# Patient Record
Sex: Female | Born: 1935 | Race: White | Hispanic: No | Marital: Married | Smoking: Former smoker
Health system: Southern US, Community
[De-identification: ages and names within clinical notes are randomized; demographics above are authoritative.]

## PROBLEM LIST (undated history)

## (undated) DIAGNOSIS — Z85828 Personal history of other malignant neoplasm of skin: Secondary | ICD-10-CM

## (undated) DIAGNOSIS — G629 Polyneuropathy, unspecified: Secondary | ICD-10-CM

## (undated) DIAGNOSIS — R0600 Dyspnea, unspecified: Secondary | ICD-10-CM

## (undated) DIAGNOSIS — R5383 Other fatigue: Secondary | ICD-10-CM

## (undated) DIAGNOSIS — D352 Benign neoplasm of pituitary gland: Secondary | ICD-10-CM

## (undated) DIAGNOSIS — K649 Unspecified hemorrhoids: Secondary | ICD-10-CM

## (undated) DIAGNOSIS — C679 Malignant neoplasm of bladder, unspecified: Secondary | ICD-10-CM

## (undated) DIAGNOSIS — J449 Chronic obstructive pulmonary disease, unspecified: Secondary | ICD-10-CM

## (undated) DIAGNOSIS — K317 Polyp of stomach and duodenum: Secondary | ICD-10-CM

## (undated) DIAGNOSIS — E559 Vitamin D deficiency, unspecified: Secondary | ICD-10-CM

## (undated) DIAGNOSIS — I219 Acute myocardial infarction, unspecified: Secondary | ICD-10-CM

## (undated) DIAGNOSIS — Z974 Presence of external hearing-aid: Secondary | ICD-10-CM

## (undated) DIAGNOSIS — K5900 Constipation, unspecified: Secondary | ICD-10-CM

## (undated) DIAGNOSIS — M1990 Unspecified osteoarthritis, unspecified site: Secondary | ICD-10-CM

## (undated) DIAGNOSIS — J189 Pneumonia, unspecified organism: Secondary | ICD-10-CM

## (undated) DIAGNOSIS — R011 Cardiac murmur, unspecified: Secondary | ICD-10-CM

## (undated) DIAGNOSIS — I422 Other hypertrophic cardiomyopathy: Secondary | ICD-10-CM

## (undated) DIAGNOSIS — I5181 Takotsubo syndrome: Secondary | ICD-10-CM

## (undated) DIAGNOSIS — Z8619 Personal history of other infectious and parasitic diseases: Secondary | ICD-10-CM

## (undated) DIAGNOSIS — I779 Disorder of arteries and arterioles, unspecified: Secondary | ICD-10-CM

## (undated) DIAGNOSIS — K2960 Other gastritis without bleeding: Secondary | ICD-10-CM

## (undated) DIAGNOSIS — R7303 Prediabetes: Secondary | ICD-10-CM

## (undated) DIAGNOSIS — R0609 Other forms of dyspnea: Secondary | ICD-10-CM

## (undated) DIAGNOSIS — D494 Neoplasm of unspecified behavior of bladder: Secondary | ICD-10-CM

## (undated) DIAGNOSIS — R32 Unspecified urinary incontinence: Secondary | ICD-10-CM

## (undated) DIAGNOSIS — R351 Nocturia: Secondary | ICD-10-CM

## (undated) DIAGNOSIS — T4145XA Adverse effect of unspecified anesthetic, initial encounter: Secondary | ICD-10-CM

## (undated) DIAGNOSIS — I2510 Atherosclerotic heart disease of native coronary artery without angina pectoris: Secondary | ICD-10-CM

## (undated) DIAGNOSIS — N3281 Overactive bladder: Secondary | ICD-10-CM

## (undated) DIAGNOSIS — Z955 Presence of coronary angioplasty implant and graft: Secondary | ICD-10-CM

## (undated) DIAGNOSIS — I739 Peripheral vascular disease, unspecified: Secondary | ICD-10-CM

## (undated) DIAGNOSIS — R2681 Unsteadiness on feet: Secondary | ICD-10-CM

## (undated) DIAGNOSIS — T8859XA Other complications of anesthesia, initial encounter: Secondary | ICD-10-CM

## (undated) DIAGNOSIS — R570 Cardiogenic shock: Secondary | ICD-10-CM

## (undated) DIAGNOSIS — G4733 Obstructive sleep apnea (adult) (pediatric): Secondary | ICD-10-CM

## (undated) DIAGNOSIS — I10 Essential (primary) hypertension: Secondary | ICD-10-CM

## (undated) DIAGNOSIS — I781 Nevus, non-neoplastic: Secondary | ICD-10-CM

## (undated) DIAGNOSIS — E785 Hyperlipidemia, unspecified: Secondary | ICD-10-CM

## (undated) DIAGNOSIS — R319 Hematuria, unspecified: Secondary | ICD-10-CM

## (undated) DIAGNOSIS — J339 Nasal polyp, unspecified: Secondary | ICD-10-CM

## (undated) DIAGNOSIS — C439 Malignant melanoma of skin, unspecified: Secondary | ICD-10-CM

## (undated) DIAGNOSIS — K449 Diaphragmatic hernia without obstruction or gangrene: Secondary | ICD-10-CM

## (undated) DIAGNOSIS — H9190 Unspecified hearing loss, unspecified ear: Secondary | ICD-10-CM

## (undated) DIAGNOSIS — M810 Age-related osteoporosis without current pathological fracture: Secondary | ICD-10-CM

## (undated) DIAGNOSIS — K219 Gastro-esophageal reflux disease without esophagitis: Secondary | ICD-10-CM

## (undated) DIAGNOSIS — R42 Dizziness and giddiness: Secondary | ICD-10-CM

## (undated) DIAGNOSIS — M419 Scoliosis, unspecified: Secondary | ICD-10-CM

## (undated) DIAGNOSIS — D500 Iron deficiency anemia secondary to blood loss (chronic): Secondary | ICD-10-CM

## (undated) DIAGNOSIS — Z8719 Personal history of other diseases of the digestive system: Secondary | ICD-10-CM

## (undated) DIAGNOSIS — I252 Old myocardial infarction: Secondary | ICD-10-CM

## (undated) DIAGNOSIS — R0989 Other specified symptoms and signs involving the circulatory and respiratory systems: Secondary | ICD-10-CM

## (undated) DIAGNOSIS — N189 Chronic kidney disease, unspecified: Secondary | ICD-10-CM

## (undated) HISTORY — PX: WISDOM TOOTH EXTRACTION: SHX21

## (undated) HISTORY — PX: CARDIOVASCULAR STRESS TEST: SHX262

## (undated) HISTORY — PX: NASAL POLYP EXCISION: SHX2068

## (undated) HISTORY — PX: TRANSTHORACIC ECHOCARDIOGRAM: SHX275

## (undated) HISTORY — DX: Takotsubo syndrome: I51.81

## (undated) HISTORY — DX: Hyperlipidemia, unspecified: E78.5

## (undated) HISTORY — DX: Cardiac murmur, unspecified: R01.1

## (undated) HISTORY — DX: Atherosclerotic heart disease of native coronary artery without angina pectoris: I25.10

## (undated) HISTORY — PX: CATARACT EXTRACTION W/ INTRAOCULAR LENS  IMPLANT, BILATERAL: SHX1307

## (undated) HISTORY — PX: CORONARY ANGIOPLASTY WITH STENT PLACEMENT: SHX49

## (undated) HISTORY — PX: CATARACT EXTRACTION, BILATERAL: SHX1313

## (undated) HISTORY — PX: HEMORROIDECTOMY: SUR656

## (undated) HISTORY — DX: Other hypertrophic cardiomyopathy: I42.2

## (undated) HISTORY — PX: COLONOSCOPY: SHX174

---
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
## 2011-05-05 DIAGNOSIS — I5181 Takotsubo syndrome: Secondary | ICD-10-CM

## 2011-05-05 HISTORY — DX: Takotsubo syndrome: I51.81

## 2011-05-09 DIAGNOSIS — R079 Chest pain, unspecified: Secondary | ICD-10-CM

## 2011-05-11 ENCOUNTER — Encounter (HOSPITAL_COMMUNITY): Payer: Self-pay | Admitting: Cardiology

## 2011-05-11 ENCOUNTER — Inpatient Hospital Stay (HOSPITAL_COMMUNITY)
Admission: AD | Admit: 2011-05-11 | Discharge: 2011-05-16 | DRG: 246 | Disposition: A | Discharge status: Hospice | Payer: Medicare Other | Source: Other Acute Inpatient Hospital | Attending: Cardiology | Admitting: Cardiology

## 2011-05-11 DIAGNOSIS — I059 Rheumatic mitral valve disease, unspecified: Secondary | ICD-10-CM | POA: Diagnosis present

## 2011-05-11 DIAGNOSIS — I517 Cardiomegaly: Secondary | ICD-10-CM | POA: Diagnosis present

## 2011-05-11 DIAGNOSIS — R000 Tachycardia, unspecified: Secondary | ICD-10-CM | POA: Diagnosis present

## 2011-05-11 DIAGNOSIS — J339 Nasal polyp, unspecified: Secondary | ICD-10-CM

## 2011-05-11 DIAGNOSIS — I2510 Atherosclerotic heart disease of native coronary artery without angina pectoris: Secondary | ICD-10-CM | POA: Diagnosis present

## 2011-05-11 DIAGNOSIS — IMO0002 Reserved for concepts with insufficient information to code with codable children: Secondary | ICD-10-CM

## 2011-05-11 DIAGNOSIS — I214 Non-ST elevation (NSTEMI) myocardial infarction: Principal | ICD-10-CM | POA: Diagnosis present

## 2011-05-11 DIAGNOSIS — Z87891 Personal history of nicotine dependence: Secondary | ICD-10-CM

## 2011-05-11 DIAGNOSIS — I252 Old myocardial infarction: Secondary | ICD-10-CM

## 2011-05-11 DIAGNOSIS — R570 Cardiogenic shock: Secondary | ICD-10-CM | POA: Diagnosis present

## 2011-05-11 DIAGNOSIS — I10 Essential (primary) hypertension: Secondary | ICD-10-CM | POA: Diagnosis present

## 2011-05-11 DIAGNOSIS — I5181 Takotsubo syndrome: Secondary | ICD-10-CM | POA: Diagnosis present

## 2011-05-11 DIAGNOSIS — I959 Hypotension, unspecified: Secondary | ICD-10-CM | POA: Diagnosis present

## 2011-05-11 HISTORY — DX: Pneumonia, unspecified organism: J18.9

## 2011-05-11 HISTORY — DX: Acute myocardial infarction, unspecified: I21.9

## 2011-05-11 HISTORY — DX: Old myocardial infarction: I25.2

## 2011-05-11 HISTORY — DX: Essential (primary) hypertension: I10

## 2011-05-11 HISTORY — DX: Gastro-esophageal reflux disease without esophagitis: K21.9

## 2011-05-11 NOTE — H&P (Signed)
Admit date: 05/11/2011 Referring Physician Dr. Darrol Angel, DO Primary Cardiologist Dr. Delane Ginger, MD Chief complaint/reason for admission: Chest pain  HPI: This is a 77 year old female with a past medical history of hypertension who developed chest burning on Sunday, November 4 in the evening. She says it became quite intense and she went to [facility]. She received 5 baby aspirin, nitro paste andpProtonix with resolution of her discomfort.  She was admitted to the hospital and ruled out for myocardial infarction. A chest x-ray showed a moderate sized hiatal hernia. 2-D echocardiogram normal LV function EF 60-65% with basal septal hypertrophy and systolic anterior motion of the anterior mitral valve leaflet creating an outflow tract gradient of 18 mm mercury without Valsalva. It was evidence of diastolic dysfunction. There was mild mitral regurgitation and mild pulmonic regurgitation the ascending aorta appeared normal. She underwent Lexa scan Myoview which revealed normal myocardial perfusion and EF 84%. She was discharged home on Tuesday, November 6. This morning around 8 AM she developed chest pain after waking up. This time though she said her chest pain was sharp and stabbing without radiation. There is no associated shortness of breath nausea or vomiting or diaphoresis. The pain was increased with deep breathing. She presented back to the emergency room and was given morphine nitro paste which resolved her chest pain. She was noted to have elevated cardiac enzymes and subsequently was transferred to [facility] for further evaluation. She currently is pain-free.    PMH:    Past Medical History  Diagnosis Date  . Hypertension     PSH:    Past Surgical History  Procedure Date  . Appendectomy   . Partial hysterectomy     ALLERGIES:   Iodine and Shellfish allergy  Prior to Admit Meds:   Prescriptions prior to admission  Medication Sig Dispense Refill  . lisinopril  (PRINIVIL,ZESTRIL) 40 MG tablet Take 40 mg by mouth daily.        Family HX:    Family History  Problem Relation Age of Onset  . Heart attack Mother 86  . Diabetic kidney disease Father 65  . Prostate cancer Father    Social HX:    History   Social History  . Marital Status: Married    Spouse Name: N/A    Number of Children: N/A  . Years of Education: N/A   Occupational History  . Not on file.   Social History Main Topics  . Smoking status: Former Smoker -- 1.0 packs/day for 18 years    Types: Cigarettes    Quit date: 05/11/1979  . Smokeless tobacco: Not on file  . Alcohol Use: No  . Drug Use: Not on file  . Sexually Active: Not on file   Other Topics Concern  . Not on file   Social History Narrative  . No narrative on file     ROS:  All 11 ROS were addressed and are negative except what is stated in the HPI  PHYSICAL EXAM Filed Vitals:   05/11/11 2259  BP: 81/49  Pulse: 85  Temp: 97.6 F (36.4 C)  Resp: 18   General: Well developed, well nourished, in no acute distress Head: Eyes PERRLA, No xanthomas.   Normal cephalic and atramatic  Lungs: Few crackles at bases Heart:   HRRR S1 S2 Pulses are 2+ & equal. 2/6 systolic murmur at the left lower sternal border radiating to the apex            No  carotid bruit. No JVD.  No abdominal bruits. No femoral bruits. Abdomen: Bowel sounds are positive, abdomen soft and non-tender without masses or                  Hernia's noted. Extremities:   No clubbing, cyanosis or edema.  DP +1 Neuro: Alert and oriented X 3. Psych:  Good affect, responds appropriately   Labs: White blood cell count 8.8 hemoglobin 12.8 hematocrit 38.3 platelet count 219 INR 0.9 d-dimer 0.32 sodium 132 potassium 3.7 chloride 95 CO2 30 BUN 30 creatinine 1.2 glucose 128 calcium 8.8 total bilirubin 1 AST 25 ALT 13 alkaline phosphatase 65 total CPK 91 CK-MB 9.1 troponin 0.78 total protein 6.5 albumin 3.5      Radiology:  Streaky left basilar  atelectasis and moderate size hiatal heart  EKG:  Normal sinus rhythm at 85 beats per minute with possible inferior infarct or small Q waves in V4 through V6 his nonspecific T wave abnormality in comparison to EKG done on 05/09/2011 at her new Q waves in lead 3 and T waves are now flattened throughout the EKG  ASSESSMENT:  1. Chest pain with elevated cardiac enzymes consistent with non-ST elevation MI. She is currently pain-free. 2. Hypertension 3. Systolic heart murmur consistent with echo findings of left ventricle outflow tract gradient from basal septal hypertrophy and systolic anterior motion of the anterior mitral valve leaflet as well as mild mitral regurgitation and pulmonic regurgitation.   PLAN:   1. Admit to telemetry bed  2. Cycle cardiac enzymes until they peak 3. IV heparin drip per pharmacy protocol 4. Nitro paste 1 inch anterior chest wall every 6 hours with a nitrate free period from 6 AM 12 noon daily as blood pressure tolerates 5. N.p.o. after midnight 6. Cardiac catheterization in the morning. The risks and benefits of cardiac catheterization have been reviewed including the risk of MI, CVA, bleeding, IV contrast dye allergy, renal failure, vascular complications. All questions were answered and patient understands procedure and wishes to proceed. She did clarify that she does not have an anaphylactic reaction to shellfish or IV contrast dye and her only reaction is nausea and vomiting. 7. Continue home medications. 8. No beta blocker at this time because of  low blood pressure. 9. Check fasting statin panel and hemoglobin A1c in the morning 10. Start Lipitor 40 mg daily 11. Further workup and treatment per [facility] cardiology  TURNER,TRACI R  05/11/2011  11:20 PM

## 2011-05-12 ENCOUNTER — Other Ambulatory Visit: Payer: Self-pay

## 2011-05-12 ENCOUNTER — Ambulatory Visit (HOSPITAL_COMMUNITY): Admit: 2011-05-12 | Payer: Self-pay | Admitting: Cardiovascular Disease

## 2011-05-12 ENCOUNTER — Encounter (HOSPITAL_COMMUNITY): Payer: Self-pay | Admitting: Certified Registered Nurse Anesthetist

## 2011-05-12 ENCOUNTER — Encounter (HOSPITAL_COMMUNITY)
Admission: AD | Disposition: A | Discharge status: Hospice | Payer: Self-pay | Source: Other Acute Inpatient Hospital | Attending: Cardiology

## 2011-05-12 ENCOUNTER — Inpatient Hospital Stay (HOSPITAL_COMMUNITY): Payer: Medicare Other

## 2011-05-12 DIAGNOSIS — I2109 ST elevation (STEMI) myocardial infarction involving other coronary artery of anterior wall: Secondary | ICD-10-CM

## 2011-05-12 DIAGNOSIS — I059 Rheumatic mitral valve disease, unspecified: Secondary | ICD-10-CM

## 2011-05-12 DIAGNOSIS — I214 Non-ST elevation (NSTEMI) myocardial infarction: Secondary | ICD-10-CM

## 2011-05-12 DIAGNOSIS — R570 Cardiogenic shock: Secondary | ICD-10-CM

## 2011-05-12 DIAGNOSIS — I959 Hypotension, unspecified: Secondary | ICD-10-CM | POA: Diagnosis present

## 2011-05-12 DIAGNOSIS — Z955 Presence of coronary angioplasty implant and graft: Secondary | ICD-10-CM

## 2011-05-12 HISTORY — PX: LEFT HEART CATHETERIZATION WITH CORONARY ANGIOGRAM: SHX5451

## 2011-05-12 HISTORY — DX: Presence of coronary angioplasty implant and graft: Z95.5

## 2011-05-12 HISTORY — PX: PERCUTANEOUS CORONARY STENT INTERVENTION (PCI-S): SHX5485

## 2011-05-12 LAB — CBC
HCT: 35.3 % — ABNORMAL LOW (ref 36.0–46.0)
Hemoglobin: 11.3 g/dL — ABNORMAL LOW (ref 12.0–15.0)
MCH: 32.2 pg (ref 26.0–34.0)
MCV: 96.6 fL (ref 78.0–100.0)
MCV: 97.2 fL (ref 78.0–100.0)
Platelets: 180 10*3/uL (ref 150–400)
RBC: 3.53 MIL/uL — ABNORMAL LOW (ref 3.87–5.11)
RBC: 3.63 MIL/uL — ABNORMAL LOW (ref 3.87–5.11)
WBC: 7.5 10*3/uL (ref 4.0–10.5)
WBC: 6.2 10*3/uL (ref 4.0–10.5)

## 2011-05-12 LAB — BASIC METABOLIC PANEL
BUN: 30 mg/dL — ABNORMAL HIGH (ref 6–23)
CO2: 27 mEq/L (ref 19–32)
Chloride: 98 mEq/L (ref 96–112)
GFR calc Af Amer: 57 mL/min — ABNORMAL LOW (ref >90)
Glucose, Bld: 125 mg/dL — ABNORMAL HIGH (ref 70–99)
Potassium: 4.1 mEq/L (ref 3.5–5.1)

## 2011-05-12 LAB — POCT I-STAT, CHEM 8
Creatinine, Ser: 1 mg/dL (ref 0.50–1.10)
HCT: 30 % — ABNORMAL LOW (ref 36.0–46.0)
Hemoglobin: 10.2 g/dL — ABNORMAL LOW (ref 12.0–15.0)
Potassium: 3.9 mEq/L (ref 3.5–5.1)
Sodium: 133 mEq/L — ABNORMAL LOW (ref 135–145)

## 2011-05-12 LAB — HEMOGLOBIN A1C: Hgb A1c MFr Bld: 6.1 % — ABNORMAL HIGH (ref <5.7)

## 2011-05-12 LAB — CARDIAC PANEL(CRET KIN+CKTOT+MB+TROPI)
Relative Index: INVALID (ref 0.0–2.5)
Troponin I: 0.68 ng/mL (ref <0.30)

## 2011-05-12 SURGERY — LEFT HEART CATHETERIZATION WITH CORONARY ANGIOGRAM
Anesthesia: LOCAL

## 2011-05-12 MED ORDER — SODIUM CHLORIDE 0.9 % IJ SOLN: 3.0000 mL | Freq: Two times a day (BID) | INTRAMUSCULAR | Status: DC

## 2011-05-12 MED ORDER — BIOTENE DRY MOUTH MT LIQD
15.0000 mL | Freq: Two times a day (BID) | OROMUCOSAL | Status: DC
  Administered 2011-05-12 – 2011-05-16 (×9): 15 mL via OROMUCOSAL

## 2011-05-12 MED ORDER — DIAZEPAM 5 MG PO TABS: 5.0000 mg | ORAL_TABLET | ORAL | Status: DC

## 2011-05-12 MED ORDER — NITROGLYCERIN 2 % TD OINT
1.0000 [in_us] | TOPICAL_OINTMENT | Freq: Four times a day (QID) | TRANSDERMAL | Status: DC
Start: 2011-05-12 — End: 2011-05-12
  Filled 2011-05-12: qty 30

## 2011-05-12 MED ORDER — DOPAMINE-DEXTROSE 3.2-5 MG/ML-% IV SOLN
INTRAVENOUS | Status: AC
  Administered 2011-05-12: 2 ug/kg/min via INTRAVENOUS
  Filled 2011-05-12: qty 250

## 2011-05-12 MED ORDER — DOPAMINE-DEXTROSE 3.2-5 MG/ML-% IV SOLN
2.0000 ug/kg/min | INTRAVENOUS | Status: DC
  Administered 2011-05-12: 2 ug/kg/min via INTRAVENOUS

## 2011-05-12 MED ORDER — MORPHINE SULFATE 2 MG/ML IJ SOLN: 2.0000 mg | Freq: Four times a day (QID) | INTRAMUSCULAR | Status: DC | PRN

## 2011-05-12 MED ORDER — ROSUVASTATIN CALCIUM 20 MG PO TABS
20.0000 mg | ORAL_TABLET | Freq: Every day | ORAL | Status: DC
Start: 2011-05-12 — End: 2011-05-12
  Filled 2011-05-12: qty 1

## 2011-05-12 MED ORDER — LIDOCAINE HCL (PF) 1 % IJ SOLN
INTRAMUSCULAR | Status: AC
  Filled 2011-05-12: qty 30

## 2011-05-12 MED ORDER — FLUMAZENIL 0.5 MG/5ML IV SOLN
INTRAVENOUS | Status: AC
  Filled 2011-05-12: qty 5

## 2011-05-12 MED ORDER — NITROGLYCERIN 0.2 MG/ML ON CALL CATH LAB
INTRAVENOUS | Status: AC
  Filled 2011-05-12: qty 1

## 2011-05-12 MED ORDER — FLUMAZENIL 0.5 MG/5ML IV SOLN: 0.2000 mg | Freq: Once | INTRAVENOUS | Status: DC

## 2011-05-12 MED ORDER — DIAZEPAM 2 MG PO TABS: 2.0000 mg | ORAL_TABLET | Freq: Four times a day (QID) | ORAL | Status: DC | PRN

## 2011-05-12 MED ORDER — ACETAMINOPHEN 325 MG PO TABS
650.0000 mg | ORAL_TABLET | ORAL | Status: DC | PRN
  Filled 2011-05-12: qty 2

## 2011-05-12 MED ORDER — SODIUM CHLORIDE 0.9 % IJ SOLN
3.0000 mL | Freq: Two times a day (BID) | INTRAMUSCULAR | Status: DC
  Administered 2011-05-12 – 2011-05-15 (×8): 3 mL via INTRAVENOUS

## 2011-05-12 MED ORDER — HEPARIN (PORCINE) IN NACL 100-0.45 UNIT/ML-% IJ SOLN: 700.0000 [IU]/h | INTRAMUSCULAR | Status: DC

## 2011-05-12 MED ORDER — DIPHENHYDRAMINE HCL 50 MG/ML IJ SOLN
INTRAMUSCULAR | Status: AC
  Filled 2011-05-12: qty 1

## 2011-05-12 MED ORDER — ROSUVASTATIN CALCIUM 40 MG PO TABS
40.0000 mg | ORAL_TABLET | Freq: Every day | ORAL | Status: DC
  Administered 2011-05-12 – 2011-05-15 (×4): 40 mg via ORAL
  Filled 2011-05-12 (×5): qty 1

## 2011-05-12 MED ORDER — SODIUM CHLORIDE 0.9 % IV SOLN
INTRAVENOUS | Status: DC
  Administered 2011-05-12 (×2): via INTRAVENOUS

## 2011-05-12 MED ORDER — ASPIRIN 81 MG PO CHEW: 324.0000 mg | CHEWABLE_TABLET | Freq: Once | ORAL | Status: DC

## 2011-05-12 MED ORDER — ONDANSETRON HCL 4 MG/2ML IJ SOLN
4.0000 mg | Freq: Four times a day (QID) | INTRAMUSCULAR | Status: DC | PRN
  Administered 2011-05-12: 4 mg via INTRAVENOUS
  Filled 2011-05-12: qty 2

## 2011-05-12 MED ORDER — NALOXONE HCL 0.4 MG/ML IJ SOLN
INTRAMUSCULAR | Status: AC
  Filled 2011-05-12: qty 1

## 2011-05-12 MED ORDER — LISINOPRIL 40 MG PO TABS
40.0000 mg | ORAL_TABLET | Freq: Every day | ORAL | Status: DC
  Filled 2011-05-12: qty 1

## 2011-05-12 MED ORDER — SODIUM CHLORIDE 0.9 % IV SOLN: 1.0000 mL/kg/h | INTRAVENOUS | Status: DC

## 2011-05-12 MED ORDER — WHITE PETROLATUM GEL
Status: AC
  Filled 2011-05-12: qty 5

## 2011-05-12 MED ORDER — WHITE PETROLATUM GEL
Status: DC | PRN
  Administered 2011-05-12: 09:00:00 via TOPICAL
  Filled 2011-05-12: qty 5

## 2011-05-12 MED ORDER — NITROGLYCERIN 0.4 MG SL SUBL: 0.4000 mg | SUBLINGUAL_TABLET | SUBLINGUAL | Status: DC | PRN

## 2011-05-12 MED ORDER — ASPIRIN 81 MG PO CHEW: 324.0000 mg | CHEWABLE_TABLET | ORAL | Status: DC

## 2011-05-12 MED ORDER — SODIUM CHLORIDE 0.9 % IJ SOLN: 3.0000 mL | INTRAMUSCULAR | Status: DC | PRN

## 2011-05-12 MED ORDER — SODIUM CHLORIDE 0.9 % IV SOLN
INTRAVENOUS | Status: DC
  Administered 2011-05-12 – 2011-05-13 (×2): via INTRAVENOUS

## 2011-05-12 MED ORDER — FENTANYL CITRATE 0.05 MG/ML IJ SOLN
INTRAMUSCULAR | Status: AC
  Filled 2011-05-12: qty 2

## 2011-05-12 MED ORDER — ASPIRIN EC 81 MG PO TBEC
81.0000 mg | DELAYED_RELEASE_TABLET | Freq: Every day | ORAL | Status: DC
  Administered 2011-05-12 – 2011-05-16 (×5): 81 mg via ORAL
  Filled 2011-05-12 (×5): qty 1

## 2011-05-12 MED ORDER — HEPARIN (PORCINE) IN NACL 2-0.9 UNIT/ML-% IJ SOLN
INTRAMUSCULAR | Status: AC
  Filled 2011-05-12: qty 2000

## 2011-05-12 MED ORDER — TICAGRELOR 90 MG PO TABS
ORAL_TABLET | ORAL | Status: AC
  Filled 2011-05-12: qty 2

## 2011-05-12 MED ORDER — DIAZEPAM 5 MG PO TABS: 5.0000 mg | ORAL_TABLET | Freq: Once | ORAL | Status: DC

## 2011-05-12 MED ORDER — SODIUM CHLORIDE 0.9 % IV SOLN: 0.2500 mg/kg/h | INTRAVENOUS | Status: DC

## 2011-05-12 MED ORDER — LISINOPRIL 2.5 MG PO TABS
2.5000 mg | ORAL_TABLET | Freq: Every day | ORAL | Status: DC
  Filled 2011-05-12: qty 1

## 2011-05-12 MED ORDER — NOREPINEPHRINE BITARTRATE 1 MG/ML IJ SOLN
2.0000 ug/min | INTRAVENOUS | Status: DC
  Administered 2011-05-12: 5 ug/min via INTRAVENOUS
  Filled 2011-05-12 (×2): qty 8

## 2011-05-12 MED ORDER — SODIUM CHLORIDE 0.9 % IV SOLN
INTRAVENOUS | Status: DC
  Administered 2011-05-12: 150 mL via INTRAVENOUS

## 2011-05-12 MED ORDER — TICAGRELOR 90 MG PO TABS
90.0000 mg | ORAL_TABLET | Freq: Two times a day (BID) | ORAL | Status: DC
  Administered 2011-05-12 – 2011-05-13 (×4): 90 mg via ORAL
  Filled 2011-05-12 (×3): qty 1

## 2011-05-12 MED FILL — Heparin Sodium (Porcine) 100 Unt/ML in Sodium Chloride 0.45%: INTRAMUSCULAR | Qty: 250 | Status: AC

## 2011-05-12 NOTE — Cardiovascular Report (Signed)
NAMEMARVIN, Victoria Short NO.:  [id number]  MEDICAL RECORD NO.:  [id number]  LOCATION:  [facility]                         FACILITY:  [facility]  PHYSICIAN:  Nicki Guadalajara, M.D.     DATE OF BIRTH:  1925/05/14  DATE OF PROCEDURE: DATE OF DISCHARGE:                           CARDIAC CATHETERIZATION   INDICATIONS:  Ms. Tanayah Squitieri is an 77 year old female, who apparently was transferred from [facility] today because of chest pain.  Apparently, the patient was on the floor and had received heparin.  In the middle of the night, the patient developed increasing episodes of recurrent chest pain associated with hypotension with blood pressure of 70.  She was evaluated by Dr. Armanda Magic, and I was called acutely to perform emergent cardiac catheterization in this patient with non-STEMI acute coronary syndrome and hypotension with ongoing chest pain.  PROCEDURE:  The patient was prepped and draped in usual fashion.  Upon arrival to the catheterization laboratory, her pressure was initially 84/44.  Her intravenous fluids were increased.  The right femoral artery was punctured anteriorly and a 6-French sheath was inserted without difficulty.  Diagnostic cardiac catheterization was done utilizing 6- [place] Judkins 4 left and right coronary catheters.  A 6-French pigtail catheter was used for RAO ventriculography.  With the demonstration of high-grade 85-90% stenosis in the LAD after diagonal vessel with significant distal anterolateral apical wall motion abnormality, and with the patient's ongoing chest pain, the decision was made to proceed with the emergent percutaneous coronary intervention.  At that time, the patient did receive fentanyl 25 mcg to ongoing chest pain.  She was started on Angiomax.  ACT was documented to be therapeutic.  Brilinta 180 mg was administered during the procedure for antiplatelet therapy. A 6-French XB LAD guide was used for the intervention after  an XB3.5 LAD guide proved to be not adequate.  A Prowater wire was able to be advanced down the LAD.  Pre dilatation was done with a 2.5 x 12 mm TREK balloon.  A 2.5 x 16 mm Promus DES stent was used.  Post stent dilatation was done utilizing a 2.75 x 12 mm noncompliant TREK up to 14 atmospheres corresponding to approximately 2.8 mm.  Of note, the patient did require dose of Narcan since she became markedly confused with reduced alertness following the fentanyl and then she had normalized. At the completion of the interventional procedure, her chest pain was now completely relieved.  She had stable hemodynamics.  The arterial sheath is sutured in place with plans for sheath removal later today. The patient will be maintained on Angiomax for approximately 4 hours following the procedure.  HEMODYNAMIC DATA:  Initial central pressure was 84/44.  On LV pullback, LV pressure was 108/10, post A-wave 22.  An aortic pressure was 83/46, giving a 25 mm peak-to-peak gradient.  ANGIOGRAPHIC DATA:  Left main coronary artery was angiographically normal and bifurcated into an LAD and left circumflex system.  The LAD gave rise to several small proximal septal perforating arteries. In the LAD, there was bifurcating diagonal vessel.  Just after this diagonal vessel, there was 85-90% stenosis and at the diagonal vessel, the LAD had mild 20% narrowing.  The LAD extended to the apex and wrapped around the distal portion.  The circumflex vessel had 30-40% smooth narrowing proximally for giving rise to the marginal vessel.  The right coronary artery was a large caliber dominant vessel, which supplied a large PDA inferior LV and large posterolateral system. RAO ventriculography revealed moderate LV dysfunction with an ejection fraction of approximately 40-45%.  There was significant hypocontractility of the distal anterolateral wall apex and distal inferolateral segment.  There did appear to be the base  of the ventricle contracted vigorously.  There did appear to be at least 2+ angiographic mitral regurgitation.  Following percutaneous coronary intervention to the LAD with PTCA, stenting with a 2.5 x 16 mm Promus DES stent post dilated to approximately 2.8 mm, the 85-90% LAD stenosis was reduced to 0%.  There was brisk TIMI-3 flow.  The stent was positioned just after the diagonal takeoff.  IMPRESSION: 1. Acute coronary syndrome secondary to high-grade 85-90% left     anterior descending coronary artery stenosis after the takeoff of     the bifurcating proximal diagonal vessel. 2. 30-40% narrowing in the proximal circumflex coronary artery. 3. Normal large dominant right coronary artery. 4. Moderate acute left ventricular dysfunction with significant     hypocontractility involving the distal anterolateral apical and     distal inferior wall with 2+ angiographic regurgitation. 5. Left ventricle to ascending aorta gradient suggestive of aortic     stenosis with 25 mm gradient.  The patient left the catheterization laboratory with stable hemodynamics.  Followup 2D echo Doppler study will also be ordered for further evaluation of aortic and mitral valve and LV apex.          ______________________________ Nicki Guadalajara, M.D.     TK/MEDQ  D:  05/12/2011  T:  05/12/2011  Job:  [id number]  cc:   Quintin Alto, MD [facility]

## 2011-05-12 NOTE — Progress Notes (Signed)
  Echocardiogram 2D Echocardiogram has been performed.  [name], Victoria Short 05/12/2011, 10:11 AM

## 2011-05-12 NOTE — Progress Notes (Signed)
32fr arterial sheath removed from rt fem artery per md orders. Rt fem artery site wnl, pressure held for 20 min with no hematoma noted at site. Pt tol well, site wnl. Sheath pull performed by cath lab staff rn e dubray rn.

## 2011-05-12 NOTE — Procedures (Signed)
Cath/PCI note dictated [phone]

## 2011-05-12 NOTE — Op Note (Signed)
See dictated cath report [id number]

## 2011-05-12 NOTE — Procedures (Signed)
Central Venous Catheter Insertion Procedure Note Victoria Short [id number] 1924/12/06  Requested for shock , Dr Myrtis Ser  Procedure: Insertion of Central Venous Catheter Indications: cardiogenc hsock, pressors, cvp?  Procedure Details Consent: from pt Time Out: Verified patient identification, verified procedure, site/side was marked, verified correct patient position, special equipment/implants available, medications/allergies/relevent history reviewed, required imaging and test results available.  Performed  Maximum sterile technique was used including antiseptics, cap, gloves, gown, hand hygiene, mask and sheet. Skin prep: Chlorhexidine; local anesthetic administered A antimicrobial bonded/coated triple lumen catheter was placed in the right internal jugular vein using the Seldinger technique.  Evaluation Blood flow good Complications: No apparent complications Patient did tolerate procedure well. Chest X-ray ordered to verify placement.  CXR: normal.  Victoria Short J. 05/12/2011, 9:44 PM

## 2011-05-12 NOTE — Progress Notes (Signed)
CRITICAL VALUE ALERT  Critical value received:  Troponin   Date of notification:  05/12/2011   Time of notification:  11:21 AM   Critical value read back:yes  Nurse who received alert:  Delice Bison   MD notified (1st page):  Dr. Thad Ranger, card resident   Time of first page:  11:22 AM   MD notified (2nd page):  Time of second page:  Responding MD:  Dr. Thad Ranger, card resident  Time MD responded:  11:22 AM

## 2011-05-12 NOTE — Procedures (Signed)
Procedure:  Radial Aline placement Indications:  Hypotension and pressor dependent  R radial area cleaned with chlorhexidine.  Patient draped in sterile fashion, attempted to place radial aline with ultrasound guidance.  Blood return x2 sticks but unable to thread catheter.  Pressure held for 5 minutes.  Pressure dressing applied.

## 2011-05-12 NOTE — Progress Notes (Signed)
[facility] CARDIOLOGY - PGY II RESIDENT NOTE  Correction, primary cardiology: Dr. Kirke Corin, Jonita Albee  Subjective:  Overnight, pt underwent emergent catheterization. Following procedure, pt developed hypotension with blood pressure sustained between 70-90s, requiring initiation of pressors (dopamine). Currently, the pt denies symptoms of chest pain, chest pressure/discomfort, dyspnea, palpitations and tachypnea, chills, fevers. She did have an episode of vomiting of a nonbilious, nonbloody vomitus this morning.   Objective:  Vital Signs:   BP 98/47  Pulse 86  Temp(Src) 97.4 F (36.3 C) (Oral)  Resp 21  Ht 5\' 8"  (1.727 m)  Wt 124 lb 12.5 oz (56.6 kg)  BMI 18.97 kg/m2  SpO2 95%    Intake/Output:  11/07 0701 - 11/08 0700 In: 474.2 [I.V.:474.2] Out: -    Physical Exam: Physical examination had to be deferred secondary to instability of blood pressures and requirement for STAT 2-D Echo, Central line placement. Was completed by Dr. Myrtis Ser, attending.    Labs: Basic Metabolic Panel:  Lab 05/12/11 8657  NA 131*  K 4.1  CL 98  CO2 27  GLUCOSE 125*  BUN 30*  CREATININE 1.02  CALCIUM 8.6  MG --  PHOS --   CBC:  Lab 05/12/11 0630 05/12/11 0121  WBC 6.2 7.5  NEUTROABS -- --  HGB 11.7* 11.3*  HCT 35.3* 34.1*  MCV 97.2 96.6  PLT 156 180    Microbiology: Results for orders placed during the hospital encounter of 05/11/11  MRSA PCR SCREENING     Status: Normal   Collection Time   05/12/11  4:36 AM      Component Value Range Status Comment   MRSA by PCR NEGATIVE  NEGATIVE  Final     Other results:  EKG (05/09/2011): at her new Q waves in lead 3 and T waves are now flattened throughout the EKG.  EKG (05/11/2011): Normal sinus rhythm at 85 beats per minute with possible inferior infarct or small Q waves in V4 through V6 his nonspecific T wave abnormality in comparison to  EKG (05/12/2011, 08:30AM): Regular rhythm, rate approximately 100 bpm, normal axis, mild ST elevation V4, V5.     Imaging:  Dg Chest Portable 1 View (05/12/2011) - Cardiomegaly and bihilar fullness.  Retrocardiac opacity.  PA and lateral radiographs recommended when the patient tolerate.      Medications:   Infusions: . sodium chloride 10 mL/hr at 05/12/11 0641  . sodium chloride    . bivalirudin (ANGIOMAX) infusion 5 mg/mL (Cath Lab,ACS,PCI indication) 0.25 mg/kg/hr (05/12/11 0500)  . DOPamine 10 mcg/kg/min (05/12/11 0800)  . norepinephrine (LEVOPHED) Adult infusion      Scheduled Medications: . white petrolatum      . antiseptic oral rinse  15 mL Mouth Rinse BID  . aspirin EC  81 mg Oral Daily  . lisinopril  2.5 mg Oral Daily  . nitroGLYCERIN  1 inch Topical Q6H  . rosuvastatin  40 mg Oral q1800  . sodium chloride  3 mL Intravenous Q12H  . Ticagrelor  90 mg Oral BID    PRN Medications: acetaminophen, diazepam, morphine injection, nitroGLYCERIN, ondansetron (ZOFRAN) IV, sodium chloride, DISCONTD: sodium chloride    Assessment/ Plan:  Pt is a 77 y.o. yo female with a PMHx of HTN and recent admission at [facility] from 11/4-11/6/12 for chest pain determined to not be cardiac in origin (2-D echo - LV function EF 60-65% (outflow tract obstruction with velocity of 76m/sec), Lexiscan myoview showing normal myocardial perfusion and EF 84%) who subsequently presented to [facility] again  on 05/11/2011 for recurrence of sharp, stabbing chest pain that occurred at 8AM on morning of admission.  At [facility] ER, found to have NSTEMI with noted elevated cardiac enzymes. Subsequently transferred to [facility] for further management of NSTEMI. Overnight on evening of [facility] admission, pt continued to have chest pain requiring emergent catheterization on 05/11/2011 with subsequent PCI to the LAD.   NSTEMI s/p emergent catheterization with DES placement in LAD (05/12/11): Cath showed high-grade 85-90% stenosis in LAD and 30-40% narrowing in proximal circumflex. Moderate acute LV dysfunction with significant  hypocontractility of distal anterolateral apical and distal inferior wall with 2+ angiographic regurgitation. LV EF 40-45% per RAO ventriculography. Since return from cath lab, pt has had persistent hypotension with SBPs in 70-90s, requiring initiation of Dopamine, with which she became tachycardic with HR's up to 120s bpm. Although the pt has mild ST elevation in V4-V6 per EKG at 8:30 this AM, changed from EKG at 4:00AM, there is currently only mild chest pain, much reduced from prior presenting pain.   Continue statin, aspirin, ticagrelor, oxygen supplementation, nitroglycerin PRN (being mindful of hypotension).  Reduce fluids in setting of mild pulmonary crackles.   Allow ice chips.  Will place Foley catheter for strict evaluation of I/O.  STAT 2-D echcardiogram.  Will closely monitor renal function post-catheterization.  Will continue to monitor closely for progression of chest pain, if occurs, may have to consider recatheterization.    Hypotension - unclear etiology, no evidence of acute infection, no acute blood loss, does not appear volume deplete (actually a few crackles in her lungs indicating possible mild hypervolemic state). Will need to closely monitor hemodynamics.  Will place radial arterial line for more accurate blood pressure monitoring.  Will dc dopamine.  Start Levophed.   DC all antihypertensives at this time, remove nitro paste.    H/o hypertension   Hold antihypertensives at this time secondary to hypotension.    Systolic heart murmur - consistent with echo findings of left ventricle outflow tract gradient from basal septal hypertrophy and systolic anterior motion of the anterior mitral valve leaflet as well as mild mitral regurgitation and pulmonic regurgitation. Likely exacerbated in setting of hypotension, tachycardia.    This patient's case and plan of care was discussed with attending, Willa Rough, MD.    Johnette Abraham, D.O.  PGYII,  Internal Medicine Resident 05/12/2011, 9:05 AM  Patient seen and examined. I agree with the assessment and plan as detailed above. See also my additional thoughts below.   The patient presented with chest pain. There was mild initial troponin elevation. We are still awaiting further troponins. Urgent catheterization was done. There was a tight LAD lesion. However in addition the wall motion abnormality raises the question also of a component of Takat-subo. In addition to this the patient had a small left ventricular outflow tract gradient by echo previously. Earlier this morning she was hypotensive but maintaining her mentation well. She was tachycardic. Excellent recommendation was made to switch her from dopamine to Levothroid and this was done. They're several reasons why Levothroid would be a better drug for her. Now on relatively low dose Leto said her systolic blood pressure is in the range of 100 and her heart rate has slowed down into the range of 80. We will watch her carefully concerning her blood pressure and urine output. Other medications that could lower her blood pressure has been put on hold.  Willa Rough, MD, [facility] 05/12/2011 10:50 AM

## 2011-05-12 NOTE — Progress Notes (Signed)
05/12/11  0045AM Nursing Patient complains of chest pain 6 out of 10,EKG done,nitroglycerin SL was not given,BP is 79/85mmHg.Dr Karie Schwalbe. Turner made aware with order to give bolus of normal saline 500cc and  to give morphine IV 2mg  once systolic pressure is 100 and above and every 6 hours PRN for chest pain.Bolus normal saline almost done B/P is 85/56mmHg,pain is 8 out of 10,Dr turner made aware with order to kept pt ready for emergency cardiac catheterization,Rapid response nurse(Cindy) is also with the pt.Dr. Mayford Knife came and pt was move to Cardiac Laboratory at 0150 AM.  Victoria Short.

## 2011-05-12 NOTE — Progress Notes (Signed)
ANTICOAGULATION CONSULT NOTE - Initial Consult  Pharmacy Consult for Heparin  Indication: chest pain/ACS  Allergies  Allergen Reactions  . Iodine Nausea And Vomiting  . Shellfish Allergy     Unsteady gait - No anaphylaxis    Patient Measurements: Height: 5\' 8"  (172.7 cm) Weight: 123 lb 14.4 oz (56.2 kg) IBW/kg (Calculated) : 63.9   Vital Signs: Temp: 97.6 F (36.4 C) (11/07 2259) Temp src: Oral (11/07 2259) BP: 81/49 mmHg (11/07 2259) Pulse Rate: 85  (11/07 2259)  Labs: No results found for this basename: HGB:2,HCT:3,PLT:3,APTT:3,LABPROT:3,INR:3,HEPARINUNFRC:3,CREATININE:3,CKTOTAL:3,CKMB:3,TROPONINI:3 in the last 72 hours CrCl is unknown because no creatinine reading has been taken.  Medical History: Past Medical History  Diagnosis Date  . Hypertension     Medications:  Prescriptions prior to admission  Medication Sig Dispense Refill  . beclomethasone (BECONASE-AQ) 42 MCG/SPRAY nasal spray Place 1 spray into the nose 2 (two) times daily. Dose is for each nostril.       Marland Kitchen lisinopril (PRINIVIL,ZESTRIL) 40 MG tablet Take 40 mg by mouth daily.         Assessment: 77 yo female with chest pain for Heparin.  Heparin 3500 unit IV bolus, 700 units/hr  started at [facility] at 930 pm 11/7 Goal of Therapy:  Heparin level 0.3-0.7 units/ml   Plan:  Continue Heparin at current rate.  Check am level  Ilian Wessell, Gary Fleet 05/12/2011,12:34 AM

## 2011-05-13 ENCOUNTER — Encounter (HOSPITAL_COMMUNITY): Payer: Self-pay

## 2011-05-13 LAB — CARDIAC PANEL(CRET KIN+CKTOT+MB+TROPI)
CK, MB: 4.1 ng/mL — ABNORMAL HIGH (ref 0.3–4.0)
Troponin I: 0.45 ng/mL (ref <0.30)

## 2011-05-13 LAB — BASIC METABOLIC PANEL
Chloride: 108 mEq/L (ref 96–112)
Creatinine, Ser: 0.66 mg/dL (ref 0.50–1.10)
GFR calc Af Amer: >90 mL/min (ref >90)
Potassium: 4 mEq/L (ref 3.5–5.1)
Sodium: 141 mEq/L (ref 135–145)

## 2011-05-13 LAB — MAGNESIUM: Magnesium: 2.1 mg/dL (ref 1.5–2.5)

## 2011-05-13 LAB — CBC
Hemoglobin: 11.3 g/dL — ABNORMAL LOW (ref 12.0–15.0)
MCHC: 32.8 g/dL (ref 30.0–36.0)

## 2011-05-13 MED ORDER — FLUTICASONE PROPIONATE 50 MCG/ACT NA SUSP
2.0000 | Freq: Every day | NASAL | Status: DC
  Administered 2011-05-13 – 2011-05-16 (×4): 2 via NASAL
  Filled 2011-05-13: qty 16

## 2011-05-13 MED ORDER — ENOXAPARIN SODIUM 40 MG/0.4ML ~~LOC~~ SOLN
40.0000 mg | SUBCUTANEOUS | Status: DC
  Administered 2011-05-13 – 2011-05-15 (×3): 40 mg via SUBCUTANEOUS
  Filled 2011-05-13 (×4): qty 0.4

## 2011-05-13 MED ORDER — SODIUM CHLORIDE 0.9 % IJ SOLN
10.0000 mL | Freq: Two times a day (BID) | INTRAMUSCULAR | Status: DC
  Administered 2011-05-13 – 2011-05-16 (×5): 10 mL via INTRAVENOUS

## 2011-05-13 MED ORDER — DOCUSATE SODIUM 100 MG PO CAPS
100.0000 mg | ORAL_CAPSULE | Freq: Two times a day (BID) | ORAL | Status: DC
  Administered 2011-05-13 – 2011-05-16 (×7): 100 mg via ORAL
  Filled 2011-05-13 (×9): qty 1

## 2011-05-13 MED ORDER — SODIUM CHLORIDE 0.9 % IJ SOLN: 10.0000 mL | INTRAMUSCULAR | Status: DC | PRN

## 2011-05-13 NOTE — Progress Notes (Signed)
CARDIAC REHAB PHASE I   PRE:  Rate/Rhythm: 77 SR    BP: sitting 101/48, 114/45 after using BSC    SaO2: 95 RA  MODE:  Ambulation: 350 ft   POST:  Rate/Rhythm: 90 SR    BP: sitting 11/53     SaO2: 95 RA  Assisted pt to [facility] and tolerated well so therefore ambulated pt assist x1 350 ft. Tolerated well. Feels good. BP up with activity. Ed completed and pt interested in CRPII.  [id number] Harriet Masson CES, ACSM

## 2011-05-13 NOTE — Progress Notes (Signed)
[facility] CARDIOLOGY   Subjective:  There were no events overnight. Currently, the patient states she is feeling well, without acute complaints. she denies symptoms of chest pain, chest pressure/discomfort, dyspnea, irregular heart beat and palpitations, chills, fevers, headache.  Pt had central line and radial line placed yesterday to allow for better monitoring of blood pressures, CVP, and administration of vasopressors. With the goal SBP of 95 mmHg, pt has been unable to wean from Levophed, and is currently on 5 mcg/min.   Objective:  Vital Signs:   BP 110/35  Pulse 80  Temp(Src) 97.8 F (36.6 C) (Oral)  Resp 19  Ht 5\' 8"  (1.727 m)  Wt 125 lb 12.8 oz (57.063 kg)  BMI 19.13 kg/m2  SpO2 94%    Intake/Output:  11/08 0701 - 11/09 0700 In: 1250.7 [P.O.:480; I.V.:770.7] Out: 2525 [Urine:2525]  Net: 2525-1250 = - 1275  Physical Exam: General: Vital signs reviewed and noted. Well-developed, well-nourished, in no acute distress; alert, appropriate and cooperative throughout examination.  Lungs:  Normal respiratory effort. Clear to auscultation basilar crackles.   Heart: RRR. S1 and S2 normal without gallop, rubs. (+) systolic murmur.  Abdomen:  BS normoactive. Soft, Nondistended, non-tender.  No masses or organomegaly.  Extremities: No pretibial edema.     Labs: Basic Metabolic Panel: Lab 05/12/11 0310 05/12/11 0121  NA 133* 131*  K 3.9 4.1  CL 100 98  CO2 -- 27  GLUCOSE 127* 125*  BUN 25* 30*  CREATININE 1.00 1.02  CALCIUM -- 8.6  MG -- --  PHOS -- --    CBC: Lab 05/13/11 0500 05/12/11 0630 05/12/11 0310 05/12/11 0121  WBC 6.2 6.2 -- 7.5  NEUTROABS -- -- -- --  HGB 11.3* 11.7* 10.2* --  HCT 34.5* 35.3* 30.0* --  MCV 98.9 97.2 -- 96.6  PLT 196 156 -- 180    Cardiac Enzymes: Lab 05/12/11 1800 05/12/11 1019  CKTOTAL 51 52  CKMB 5.2* 5.2*  CKMBINDEX -- --  TROPONINI 0.68* 0.77*    Microbiology: MRSA PCR SCREENING     Status: Negative    Other  results:  EKG (05/09/2011): at her new Q waves in lead 3 and T waves are now flattened throughout the EKG.  EKG (05/11/2011): Normal sinus rhythm at 85 beats per minute with possible inferior infarct or small Q waves in V4 through V6 his nonspecific T wave abnormality in comparison to  EKG (05/12/2011, 08:30AM): Regular rhythm, rate approximately 100 bpm, normal axis, mild ST elevation V4, V5.   EKG (05/13/2011): pending   Imaging:  Dg Chest Portable 1 View (05/12/2011) - No pneumothorax following right jugular line placement. Mild enlargement of cardiac silhouette with emphysematous changes and minimal bibasilar atelectasis versus scarring. Question atelectasis versus consolidation of left lower lobe though mass not completely excluded. Follow-up upright PA and lateral chest radiographs recommended when the patient's condition permits.    Cardiac catheterization (05/12/2011) - high-grade 85-90% stenosis in LAD and 30-40% narrowing in proximal circumflex. Moderate acute LV dysfunction with significant hypocontractility of distal anterolateral apical and distal inferior wall with 2+ angiographic regurgitation. LV EF 40-45% per RAO ventriculography.  2-D Echocardiogram (05/12/2011) - LV EF 55%, hypokinesis of mid/distal interior/ inferoseptal and apical walls. Pseudonormal left ventricular filling pattern, with concomitant abnormal relaxation and increased filling pressure (grade 2 diastolic dysfunction). SAM of anterior mitral leaflet with turbulent flow through LVOT. Peak gradient at rest is 33 mm Hg. Mild MR    Medications:   Infusions: . sodium chloride 10 mL/hr  at 05/12/11 1300  . sodium chloride 20 mL/hr at 05/13/11 0500  . norepinephrine (LEVOPHED) Adult infusion 5 mcg/min (05/13/11 0500)    Scheduled Medications: . antiseptic oral rinse  15 mL Mouth Rinse BID  . aspirin EC  81 mg Oral Daily  . rosuvastatin  40 mg Oral q1800  . sodium chloride  3 mL Intravenous Q12H  . Ticagrelor   90 mg Oral BID    PRN Medications: acetaminophen, diazepam, morphine injection, nitroGLYCERIN, ondansetron (ZOFRAN) IV, sodium chloride, white petrolatum    Assessment/ Plan:  Pt is a 77 y.o. yo female with a PMHx of HTN and recent admission at [facility] from 11/4-11/6/12 for chest pain determined to not be cardiac in origin (2-D echo - LV function EF 60-65% (outflow tract obstruction with velocity of 9m/sec), Lexiscan myoview showing normal myocardial perfusion and EF 84%) who subsequently presented to [facility] again on 05/11/2011 for recurrence of sharp, stabbing chest pain that occurred at 8AM on morning of admission. At [facility] ER, found to have NSTEMI with noted elevated cardiac enzymes. Subsequently transferred to [facility] for further management of NSTEMI. Emergent catheterization with subsequent PCI and PTCA with DES to LAD was performed on 05/12/11 AM secondary to persistent chest pain.    NSTEMI s/p emergent catheterization with DES placement in LAD for high-grade stenosis (05/12/11): After return from cath lab, pt continued to be hypotensive, and tachycardic, initially requiring Dopamine gtt (05/12/11 AM), that was then transitioned to Levophed (05/12/11 PM) secondary to tachycardia. Blood pressures, and heart rates better controlled on her current regimen. EKG at 8:30 AM on 05/12/2011 showed mild ST elevation in V4-6, changed from EKG at 4:00AM, however, pt continues to remain asymptomatic. We are pending EKG this AM.   Continue statin, aspirin, ticagrelor, oxygen supplementation, nitroglycerin PRN (being mindful of hypotension).   Will closely monitor renal function post-catheterization.   Will continue to monitor closely for progression of chest pain, if occurs, may have to consider recatheterization.    Hypotension - Central line and radial A-line placed 05/12/11. Blood pressures better controlled and heart rate stable on Levophed overnight, has been weaned down to 5  mcg/min, but unable to wean further with SBPs in 80s mmHg.   Continue to hold all antihypertensives at this time.  Consider to attempt further weaning from pressors.   H/o hypertension   Hold antihypertensives at this time secondary to hypotension.   Can consider holding or reducing dose of home Lisinopril on discharge - it is unclear at this time if pt even needs to be on this medication, as she states that her blood pressures at maximum were in the 140s systolic when she was originally started on this medication 8 years ago. On current Lisinopril dosage at home, pt typically has blood pressures of 90/60s and is symptomatic with dizziness and lightheadedness, putting her at high risk for falls.   Systolic heart murmur - consistent with echo findings of left ventricle outflow tract gradient from basal septal hypertrophy and systolic anterior motion of the anterior mitral valve leaflet as well as mild mitral regurgitation and pulmonic regurgitation. Likely exacerbated in setting of hypotension, tachycardia.    This patient's case and plan of care was discussed with attending, Olga Millers, M.D.    Johnette Abraham, Norman Herrlich, Internal Medicine Resident 05/13/2011, 6:14 AM  Patient seen and examined; Agree with above. She denies CP. Blood pressure improving; etiology of hypotension unclear but may be related to apical infarct superimposed on HOCM physiology (  LVOT obstruction worse secondary to infarct). H/H stable. Enzymes not trending up. Plan wean pressors. DC foley and O2. Continue present cardiac meds. Add DVT prophylaxis. Olga Millers

## 2011-05-13 NOTE — Progress Notes (Signed)
Following pt however on Levaphed with continued low BP.  Will continue to follow. Please advise when ready to ambulate.  Will check back in pm.  Harriet Masson CES, ACSM

## 2011-05-13 NOTE — Progress Notes (Signed)
UR completed Victoria Short 05/13/2011 1225pm

## 2011-05-14 MED ORDER — FUROSEMIDE 10 MG/ML IJ SOLN
40.0000 mg | Freq: Once | INTRAMUSCULAR | Status: AC
  Administered 2011-05-14: 40 mg via INTRAMUSCULAR
  Filled 2011-05-14: qty 4

## 2011-05-14 MED ORDER — METOPROLOL TARTRATE 12.5 MG HALF TABLET
12.5000 mg | ORAL_TABLET | Freq: Two times a day (BID) | ORAL | Status: DC
  Administered 2011-05-14 – 2011-05-16 (×5): 12.5 mg via ORAL
  Filled 2011-05-14 (×6): qty 1

## 2011-05-14 MED ORDER — CLOPIDOGREL BISULFATE 300 MG PO TABS
300.0000 mg | ORAL_TABLET | Freq: Once | ORAL | Status: AC
  Administered 2011-05-14: 300 mg via ORAL
  Filled 2011-05-14: qty 1

## 2011-05-14 MED ORDER — CLOPIDOGREL BISULFATE 75 MG PO TABS
75.0000 mg | ORAL_TABLET | Freq: Every day | ORAL | Status: DC
  Administered 2011-05-15 – 2011-05-16 (×2): 75 mg via ORAL
  Filled 2011-05-14 (×3): qty 1

## 2011-05-14 NOTE — Progress Notes (Signed)
I have seen and examined the patient. I agree with the above note with the addition of : 1. Non-ST elevation myocardial infarction: The myocardial infarction does not explain the wall motion abnormality as the LAD does not wrap around the apex and also peak troponin elevation was not consistent with ST elevation myocardial infarction. It appears that the patient had plaque rupture in the LAD but also the LV gram was consistent with stress-induced cardiomyopathy. Recommend continuing dual antiplatelet therapy for one year. The patient does not have a prescription plan and cannot afford Brilinta. Thus, the patient will be switched to Plavix with a loading dose of 300 mg. For the same reason, I recommend switching Crestor to simvastatin or atorvastatin at the time of discharge.   the patient will be transferred to telemetry today. 2. Stress-induced cardiomyopathy: Start small dose metoprolol 12.5 mg twice daily and up titrate as tolerated. Avoid an ACE inhibitor for now due to dynamic LVOT obstruction. 3. hypotension: Resolved. This was due to LVOT obstruction that were sent with vasodilators initially as well as stress-induced cardiomyopathy. Avoid vasodilators and diuretics.  Lorine Bears 05/14/2011 8:36 AM

## 2011-05-14 NOTE — Progress Notes (Signed)
Received SBAR report from nurse at [id number], unit [id number].  Client is alert and cooperative.  No distress noted upon arrival to unit.

## 2011-05-14 NOTE — Progress Notes (Signed)
CARDIAC REHAB PHASE I   PRE:  Rate/Rhythm:    80 SR  BP:  Supine:   Sitting: 100/60  Standing:    SaO2: 97%  MODE:  Ambulation: 460 ft   POST:  Rate/Rhythem: 78 SR  BP:  Supine:   Sitting: 128/68  Standing:    SaO2: 98%  Pt tolerated ambulation without difficulty.  Denies CP, however reports mild dyspnea at end of walk.  VSS.  Pt returned to bed with call light in reach.-jrion,rn  [place], [place]

## 2011-05-14 NOTE — Progress Notes (Signed)
[place] CARDIOLOGY   Subjective:  There were events overnight, specifically, while sleeping, pt would have desaturation to 80s, therefore, she was resumed on oxygen therapy. Resulting in return of oxygen saturations to 90s, pt was asymptomatic with these episodes. She otherwise tolerated off of oxygen, and out of bed with ambulation yesterday. Currently, the patient confirms symptoms of nasal congestion. she denies symptoms of chest pain, chest pressure/discomfort, dyspnea, palpitations and tachypnea, chills, fevers, headache, dizziness.    Objective:  Vital Signs:   BP 89/44  Pulse 85  Temp(Src) 97.7 F (36.5 C) (Oral)  Resp 18  Ht 5\' 8"  (1.727 m)  Wt 121 lb 0.5 oz (54.9 kg)  BMI 18.40 kg/m2  SpO2 94%    Intake/Output:  11/09 0701 - 11/10 0700 In: 1138.7 [P.O.:960; I.V.:178.7] Out: 2225 [Urine:2225]   Physical Exam: General: Vital signs reviewed and noted. Well-developed, well-nourished, in no acute distress; alert, appropriate and cooperative throughout examination.  Lungs:  Normal respiratory effort. Clear to auscultation BL without crackles or wheezes.  Heart: RRR. S1 and S2 normal without gallop, murmur, or rubs.  Abdomen:  BS normoactive. Soft, Nondistended, non-tender.  No masses or organomegaly.  Extremities: No pretibial edema.     Labs: Basic Metabolic Panel: Lab 05/13/11 0500 05/12/11 0310 05/12/11 0121  NA 141 133* 131*  K 4.0 3.9 4.1  CL 108 100 98  CO2 29 -- 27  GLUCOSE 115* 127* 125*  BUN 9 25* 30*  CREATININE 0.66 1.00 1.02  CALCIUM 8.4 -- 8.6  MG 2.1 -- --  PHOS -- -- --    CBC: Lab 05/13/11 0500 05/12/11 0630 05/12/11 0310 05/12/11 0121  WBC 6.2 6.2 -- 7.5  NEUTROABS -- -- -- --  HGB 11.3* 11.7* 10.2* --  HCT 34.5* 35.3* 30.0* --  MCV 98.9 97.2 -- 96.6  PLT 196 156 -- 180    Cardiac Enzymes: Lab 05/13/11 0500 05/12/11 1800 05/12/11 1019  CKTOTAL 38 51 52  CKMB 4.1* 5.2* 5.2*  CKMBINDEX -- -- --  TROPONINI 0.45* 0.68* 0.77*     Microbiology: MRSA PCR SCREENING Status: Negative    Other results:  EKG (05/09/2011): at her new Q waves in lead 3 and T waves are now flattened throughout the EKG.   EKG (05/11/2011): Normal sinus rhythm at 85 beats per minute with possible inferior infarct or small Q waves in V4 through V6 his nonspecific T wave abnormality in comparison to   EKG (05/12/2011, 08:30AM): Regular rhythm, rate approximately 100 bpm, normal axis, mild ST elevation V4, V5.   EKG (05/13/2011): Regular rhythm, rate of approximately 80 bpm, normal axis, ST depression V4-6, ST flattening I, aVL.  Imaging:  Dg Chest Portable 1 View (05/12/2011) - No pneumothorax following right jugular line placement. Mild enlargement of cardiac silhouette with emphysematous changes and minimal bibasilar atelectasis versus scarring. Question atelectasis versus consolidation of left lower lobe though mass not completely excluded. Follow-up upright PA and lateral chest radiographs recommended when the patient's condition permits.   Cardiac catheterization (05/12/2011) - high-grade 85-90% stenosis in LAD and 30-40% narrowing in proximal circumflex. Moderate acute LV dysfunction with significant hypocontractility of distal anterolateral apical and distal inferior wall with 2+ angiographic regurgitation. LV EF 40-45% per RAO ventriculography.   2-D Echocardiogram (05/12/2011) - LV EF 55%, hypokinesis of mid/distal interior/ inferoseptal and apical walls. Pseudonormal left ventricular filling pattern, with concomitant abnormal relaxation and increased filling pressure (grade 2 diastolic dysfunction). SAM of anterior mitral leaflet with turbulent flow through LVOT. Peak  gradient at rest is 33 mm Hg. Mild MR    Medications:   Infusions: . norepinephrine (LEVOPHED) Adult infusion Stopped (05/13/11 0815)    Scheduled Medications: . antiseptic oral rinse  15 mL Mouth Rinse BID  . aspirin EC  81 mg Oral Daily  . docusate sodium  100  mg Oral BID  . enoxaparin (LOVENOX) injection  40 mg Subcutaneous Q24H  . fluticasone  2 spray Each Nare Daily  . rosuvastatin  40 mg Oral q1800  . sodium chloride  10 mL Intravenous Q12H  . sodium chloride  3 mL Intravenous Q12H  . Ticagrelor  90 mg Oral BID    PRN Medications: acetaminophen, diazepam, morphine injection, nitroGLYCERIN, ondansetron (ZOFRAN) IV, sodium chloride, sodium chloride, white petrolatum    Assessment/ Plan:  Pt is a 77 y.o. yo female with a PMHx of HTN and recent admission at [facility] from 11/4-11/6/12 for chest pain determined to not be cardiac in origin (2-D echo - LV function EF 60-65% (outflow tract obstruction with velocity of 47m/sec), Lexiscan myoview showing normal myocardial perfusion and EF 84%) who subsequently presented to [facility] again on 05/11/2011 for recurrence of sharp, stabbing chest pain that occurred at 8AM on morning of admission. At [facility] ER, found to have NSTEMI with noted elevated cardiac enzymes. Subsequently transferred to [facility] for further management of NSTEMI. Emergent catheterization with subsequent PCI and PTCA with DES to LAD was performed on 05/12/11 AM secondary to persistent chest pain.    NSTEMI s/p emergent catheterization with DES placement in LAD for high-grade stenosis (05/12/11): After return from cath lab, pt continued to be hypotensive, and tachycardic, initially requiring Dopamine gtt (05/12/11 AM), that was then transitioned to Levophed (05/12/11 PM) secondary to tachycardia, which was dc'ed on 05/13/11. Although EKG at 8:30 AM on 05/12/2011 showed mild ST elevation in V4-6, changed from EKG at 4:00AM, pt remains asymptomatic, enzymes are downtrending, and repeat EKG on 05/13/11 shows no residual ST elevations.   Continue statin, aspirin, ticagrelor, oxygen supplementation, nitroglycerin PRN (being mindful of hypotension).  Switch to Clopidogrel as pt cannot afford Ticagrelor.    Hypotension - Pt mentating  well, low BPs seem to be at her baseline now. Central line and radial A-line were placed 05/12/11. Blood pressures and HR withstood dc of Levophed yesterday.  Continue to hold all antihypertensives at this time.    H/o hypertension   Hold antihypertensives at this time secondary to hypotension.   Can consider holding or reducing dose of home Lisinopril on discharge - it is unclear at this time if pt even needs to be on this medication, as she states that her blood pressures at maximum were in the 140s systolic when she was originally started on this medication 8 years ago. On current Lisinopril dosage at home, pt typically has blood pressures of 90/60s and is symptomatic with dizziness and lightheadedness, putting her at high risk for falls.   Systolic heart murmur - consistent with echo findings of left ventricle outflow tract gradient from basal septal hypertrophy and systolic anterior motion of the anterior mitral valve leaflet as well as mild mitral regurgitation and pulmonic regurgitation. Likely exacerbated in setting of hypotension, tachycardia.     This patient's case and plan of care was discussed with attending, Lorine Bears, M.D.    Johnette Abraham, Norman Herrlich, Internal Medicine Resident 05/14/2011, 6:11 AM

## 2011-05-15 LAB — BASIC METABOLIC PANEL
GFR calc Af Amer: 90 mL/min — ABNORMAL LOW (ref >90)
GFR calc non Af Amer: 78 mL/min — ABNORMAL LOW (ref >90)
Potassium: 3.3 mEq/L — ABNORMAL LOW (ref 3.5–5.1)
Sodium: 139 mEq/L (ref 135–145)

## 2011-05-15 NOTE — Progress Notes (Signed)
SUBJECTIVE:  Feels better, no scp. No N rand V. Feels weak. Getting physical therapy. Crying spells antedating admission.   Filed Vitals:   05/14/11 1800 05/14/11 2100 05/15/11 0240 05/15/11 0529  BP: 109/62 118/69 119/68 111/66  Pulse: 81 84 71 83  Temp: 97.9 F (36.6 C) 98 F (36.7 C) 98 F (36.7 C) 98 F (36.7 C)  TempSrc:  Oral Oral Oral  Resp: 20 18 16 20   Height:      Weight:    116 lb 6.5 oz (52.8 kg)  SpO2: 94% 95% 94% 94%    Intake/Output Summary (Last 24 hours) at 05/15/11 0823 Last data filed at 05/15/11 0500  Gross per 24 hour  Intake    920 ml  Output   3001 ml  Net  -2081 ml    LABS: Basic Metabolic Panel:  Basename 05/13/11 0500  NA 141  K 4.0  CL 108  CO2 29  GLUCOSE 115*  BUN 9  CREATININE 0.66  CALCIUM 8.4  MG 2.1  PHOS --   Liver Function Tests: No results found for this basename: AST:2,ALT:2,ALKPHOS:2,BILITOT:2,PROT:2,ALBUMIN:2 in the last 72 hours No results found for this basename: LIPASE:2,AMYLASE:2 in the last 72 hours CBC:  Basename 05/13/11 0500  WBC 6.2  NEUTROABS --  HGB 11.3*  HCT 34.5*  MCV 98.9  PLT 196   Cardiac Enzymes:  Basename 05/13/11 0500 05/12/11 1800 05/12/11 1019  CKTOTAL 38 51 52  CKMB 4.1* 5.2* 5.2*  CKMBINDEX -- -- --  TROPONINI 0.45* 0.68* 0.77*   BNP: No results found for this basename: POCBNP:3 in the last 72 hours D-Dimer: No results found for this basename: DDIMER:2 in the last 72 hours Hemoglobin A1C: No results found for this basename: HGBA1C in the last 72 hours Fasting Lipid Panel: No results found for this basename: CHOL,HDL,LDLCALC,TRIG,CHOLHDL,LDLDIRECT in the last 72 hours Thyroid Function Tests: No results found for this basename: TSH,T4TOTAL,FREET3,T3FREE,THYROIDAB in the last 72 hours Anemia Panel: No results found for this basename: VITAMINB12,FOLATE,FERRITIN,TIBC,IRON,RETICCTPCT in the last 72 hours  RADIOLOGY: Dg Chest Portable 1 View  05/12/2011  *RADIOLOGY REPORT*   Clinical Data: Right jugular line placement  PORTABLE CHEST - 1 VIEW  Comparison: Portable exam 1235 hours compared to 05/12/2011  Findings: New right jugular central venous catheter, tip projecting over SVC. Mild enlargement of cardiac silhouette. Atherosclerotic calcification aorta. Mediastinal contours and pulmonary vascularity normal. Emphysematous changes with minimal bibasilar atelectasis or scarring. Persistent left lower lobe opacity, could potentially represent atelectasis or infiltrate but mass not completely excluded. Skin fold projects over lower left chest. No pneumothorax. Osseous demineralization and scoliosis.  IMPRESSION: No pneumothorax following right jugular line placement. Mild enlargement of cardiac silhouette with emphysematous changes and minimal bibasilar atelectasis versus scarring. Question atelectasis versus consolidation of left lower lobe though mass not completely excluded. Follow-up upright PA and lateral chest radiographs recommended when the patient's condition permits.  Original Report Authenticated By: Lollie Marrow, M.D.   Dg Chest Portable 1 View  05/12/2011  *RADIOLOGY REPORT*  Clinical Data: Shortness of breath  PORTABLE CHEST - 1 VIEW  Comparison: None.  Findings: Cardiomegaly.  Prominent hilar contours.  Aortic arch atherosclerotic calcification.  Retrocardiac opacity. Biapical lucency is likely artifactual.  No definite pneumothorax. Multilevel degenerative changes and mild rightward curvature of the lower thoracic spine.  No definite acute osseous abnormality.  IMPRESSION: Cardiomegaly and bihilar fullness.  Retrocardiac opacity.  PA and lateral radiographs recommended when the patient tolerate.  Original Report Authenticated By: Osborne Oman.  [facility], M.D.    PHYSICAL EXAM General:well nourished HEENT:no JVD, no thyromegaly Lungs:clear BS Heart:RRR, nl S1 S2, III/VI outflow murmur. Abdomen:soft, non tender Extremity exam:normal pulses. No  edema. Neurologic:axox3 Psychiatric:Depressed Vascular exam:WNL  TELEMETRY: NA  ASSESSMENT AND PLAN:  Principal Problem:  *NSTEMI (non-ST elevated myocardial infarction) s/p LAD stent on DAPT   Active Problems:  Hypotension- resolved off pressors Tako- tsubo cardiomyopathy - stress induced- improvement EF. Depression- will need f/u with Dr. Leandrew Koyanagi, significant weight loss and crying spells.   Disposition- d/c tomorrow, check BMET in AM    Alvin Critchley Gent,MD 8:23 AM 05/15/2011

## 2011-05-16 ENCOUNTER — Other Ambulatory Visit: Payer: Self-pay

## 2011-05-16 ENCOUNTER — Inpatient Hospital Stay (HOSPITAL_COMMUNITY): Payer: Medicare Other

## 2011-05-16 DIAGNOSIS — J339 Nasal polyp, unspecified: Secondary | ICD-10-CM

## 2011-05-16 LAB — BASIC METABOLIC PANEL
Chloride: 102 mEq/L (ref 96–112)
GFR calc Af Amer: 88 mL/min — ABNORMAL LOW (ref >90)
Potassium: 3.6 mEq/L (ref 3.5–5.1)
Sodium: 138 mEq/L (ref 135–145)

## 2011-05-16 MED ORDER — ASPIRIN 81 MG PO TBEC: 81.0000 mg | DELAYED_RELEASE_TABLET | Freq: Every day | ORAL | Status: AC

## 2011-05-16 MED ORDER — METOPROLOL SUCCINATE ER 25 MG PO TB24: 25.0000 mg | ORAL_TABLET | Freq: Every day | ORAL | Status: DC

## 2011-05-16 MED ORDER — SIMVASTATIN 40 MG PO TABS: 40.0000 mg | ORAL_TABLET | Freq: Every day | ORAL | Status: DC

## 2011-05-16 MED ORDER — NITROGLYCERIN 0.3 MG SL SUBL: 0.3000 mg | SUBLINGUAL_TABLET | SUBLINGUAL | Status: DC | PRN

## 2011-05-16 MED ORDER — CLOPIDOGREL BISULFATE 75 MG PO TABS: 75.0000 mg | ORAL_TABLET | Freq: Every day | ORAL | Status: DC

## 2011-05-16 MED ORDER — ROSUVASTATIN CALCIUM 40 MG PO TABS: 40.0000 mg | ORAL_TABLET | Freq: Every day | ORAL | Status: DC

## 2011-05-16 NOTE — Progress Notes (Signed)
SUBJECTIVE: Patient is actually doing very well today. Her cardiac status is stable. She says that she had a great day yesterday. She has problems with a nasal polyp. She does have some nasal stuffiness today. There is no fever. She needs early followup with her primary physician. Previously she saw an ENT doctor in [place]. It may be appropriate for her to see the ENT physician in [place] for further opinions.   Filed Vitals:   05/15/11 1428 05/15/11 2008 05/15/11 2144 05/16/11 0544  BP: 121/67 130/68 108/62 109/64  Pulse: 72  73 77  Temp: 98.1 F (36.7 C)  97.6 F (36.4 C) 97.3 F (36.3 C)  TempSrc: Oral  Oral Oral  Resp: 18  18 18   Height:      Weight:    115 lb 4.8 oz (52.3 kg)  SpO2: 96%  95% 95%    Intake/Output Summary (Last 24 hours) at 05/16/11 0943 Last data filed at 05/16/11 0900  Gross per 24 hour  Intake   1080 ml  Output   1450 ml  Net   -370 ml    LABS: Basic Metabolic Panel:  Basename 05/16/11 0608 05/15/11 0615  NA 138 139  K 3.6 3.3*  CL 102 99  CO2 29 29  GLUCOSE 112* 103*  BUN 8 8  CREATININE 0.76 0.71  CALCIUM 9.0 9.2  MG -- --  PHOS -- --   Liver Function Tests: No results found for this basename: AST:2,ALT:2,ALKPHOS:2,BILITOT:2,PROT:2,ALBUMIN:2 in the last 72 hours No results found for this basename: LIPASE:2,AMYLASE:2 in the last 72 hours CBC: No results found for this basename: WBC:2,NEUTROABS:2,HGB:2,HCT:2,MCV:2,PLT:2 in the last 72 hours Cardiac Enzymes: No results found for this basename: CKTOTAL:3,CKMB:3,CKMBINDEX:3,TROPONINI:3 in the last 72 hours BNP: No results found for this basename: POCBNP:3 in the last 72 hours D-Dimer: No results found for this basename: DDIMER:2 in the last 72 hours Hemoglobin A1C: No results found for this basename: HGBA1C in the last 72 hours Fasting Lipid Panel: No results found for this basename: CHOL,HDL,LDLCALC,TRIG,CHOLHDL,LDLDIRECT in the last 72 hours Thyroid Function Tests: No results found  for this basename: TSH,T4TOTAL,FREET3,T3FREE,THYROIDAB in the last 72 hours  RADIOLOGY: Dg Chest Portable 1 View  05/12/2011  *RADIOLOGY REPORT*  Clinical Data: Right jugular line placement  PORTABLE CHEST - 1 VIEW  Comparison: Portable exam 1235 hours compared to 05/12/2011  Findings: New right jugular central venous catheter, tip projecting over SVC. Mild enlargement of cardiac silhouette. Atherosclerotic calcification aorta. Mediastinal contours and pulmonary vascularity normal. Emphysematous changes with minimal bibasilar atelectasis or scarring. Persistent left lower lobe opacity, could potentially represent atelectasis or infiltrate but mass not completely excluded. Skin fold projects over lower left chest. No pneumothorax. Osseous demineralization and scoliosis.  IMPRESSION: No pneumothorax following right jugular line placement. Mild enlargement of cardiac silhouette with emphysematous changes and minimal bibasilar atelectasis versus scarring. Question atelectasis versus consolidation of left lower lobe though mass not completely excluded. Follow-up upright PA and lateral chest radiographs recommended when the patient's condition permits.  Original Report Authenticated By: Lollie Marrow, M.D.   Dg Chest Portable 1 View  05/12/2011  *RADIOLOGY REPORT*  Clinical Data: Shortness of breath  PORTABLE CHEST - 1 VIEW  Comparison: None.  Findings: Cardiomegaly.  Prominent hilar contours.  Aortic arch atherosclerotic calcification.  Retrocardiac opacity. Biapical lucency is likely artifactual.  No definite pneumothorax. Multilevel degenerative changes and mild rightward curvature of the lower thoracic spine.  No definite acute osseous abnormality.  IMPRESSION: Cardiomegaly and bihilar fullness.  Retrocardiac  opacity.  PA and lateral radiographs recommended when the patient tolerate.  Original Report Authenticated By: Waneta Martins, M.D.    PHYSICAL EXAM      Patient is stable today. She is oriented to  person time and place. Affect is normal. I've spoken with her and her husband at length.   TELEMETRY:       Patient is normal sinus rhythm.   ASSESSMENT AND PLAN:  Principal Problem:  *NSTEMI (non-ST elevated myocardial infarction)        The patient is recovering from her acute coronary syndrome in combination to the possibility of Takasugi Nocardia myopathy. She also has hypertrophic obstructive cardiomyopathy. She is stable to go home. Active Problems:  Hypertension  Takotsubo cardiomyopathy Nasal polyp       The patient mentions today that she has a nasal polyp and has congestion at this time of year. I n would be better for her to go home and have this followed up as an outpatient. We will try to arrange for her to see her primary physician soon for early followup on this and to see if another referral for ENT evaluation is appropriate. She is now on dual antiplatelet therapy which certainly will limit any invasive procedures that can be done to her nose.   Victoria Short 05/16/2011 9:43 AM

## 2011-05-16 NOTE — Discharge Summary (Signed)
Physician Discharge Summary  Patient ID: Victoria Short MRN: [id number] DOB/AGE: 1924/08/31 77 y.o.  Admit date: 05/11/2011 Discharge date: 05/16/2011  Primary Discharge Diagnosis: NSTEMI   Secondary Discharge Diagnosis: HTN, Nasal Polyp, HOCM, cardiogenic shock,Tako- tsubo cardiomyopathy - stress induced   Significant Diagnostic Studies: CXR, Cath, echo   Hospital Course: Pt is an 77 yo female with no hx CAD. She developed chest burning on Sunday, November 4 in the evening. She says it became quite intense and she went to [facility]. She received 5 baby aspirin, nitro paste andpProtonix with resolution of her discomfort. She was admitted to the hospital and ruled out for myocardial infarction. A chest x-ray showed a moderate sized hiatal hernia. 2-D echocardiogram normal LV function EF 60-65% with basal septal hypertrophy and systolic anterior motion of the anterior mitral valve leaflet creating an outflow tract gradient of 18 mm mercury without Valsalva. It was evidence of diastolic dysfunction. There was mild mitral regurgitation and mild pulmonic regurgitation the ascending aorta appeared normal. She underwent Lexa scan Myoview which revealed normal myocardial perfusion and EF 84%. She was discharged home on Tuesday, November 6.   She had recurrent CP and came to the ER at [facility]. Ez elevated c/w NSTEMI, pt admitted. She had problems with hypotension and recurrent CP. Emergency cath done on 11-8. She had a DES to the LAD. See full results below.  Following procedure, pt developed hypotension with blood pressure sustained between 70-90s, requiring initiation of pressors (dopamine/ levophed). She had an art line and CVP line inserted. Avoid and ACE inhibitor now due to dynamic LVOT obstruction. Avoid vasodilators and diuretics.  Her lisinopril was stopped and BB/statin were added.   She gradually improved. She was seen by cardiac rehab and followed by them during her stay. Pressors were  weaned and d/c'd. On 11-12, she was evaluated by Dr Myrtis Ser and considered stable for d/c, [facility] if repeat CXR OK.   Discharge Exam: Blood pressure 109/64, pulse 77, temperature 97.3 F (36.3 C), temperature source Oral, resp. rate 18, height 5\' 8"  (1.727 m), weight 115 lb 4.8 oz (52.3 kg), SpO2 95.00%.   Labs:   Lab Results  Component Value Date   WBC 6.2 05/13/2011   HGB 11.3* 05/13/2011   HCT 34.5* 05/13/2011   MCV 98.9 05/13/2011   PLT 196 05/13/2011    Lab 05/16/11 0608  NA 138  K 3.6  CL 102  CO2 29  BUN 8  CREATININE 0.76  CALCIUM 9.0  PROT --  BILITOT --  ALKPHOS --  ALT --  AST --  GLUCOSE 112*   Lab Results  Component Value Date   CKTOTAL 38 05/13/2011   CKMB 4.1* 05/13/2011   TROPONINI 0.45* 05/13/2011    No results found for this basename: CHOL   No results found for this basename: HDL   No results found for this basename: LDLCALC   No results found for this basename: TRIG   No results found for this basename: CHOLHDL   No results found for this basename: LDLDIRECT      Radiology: CXR: IMPRESSION: Cardiomegaly and bihilar fullness. Retrocardiac opacity. PA and lateral radiographs recommended when the patient tolerate. Original Report   EKG: Non-specific intra-ventricular conduction delay ST & T wave abnormality, consider anterolateral ischemia 2-D echo:Left ventricle: SAM of anterior mitral leaflet with turbulent flow through LVOT. Peak gradient at rest is 33 mm Hg. The cavity size was normal. Wall thickness was normal. Features are consistent with a  pseudonormal left ventricular filling pattern, with concomitant abnormal relaxation and increased filling pressure (grade 2 diastolic dysfunction).  Cardiac cath: The LAD gave rise to several small proximal septal perforating arteries.  In the LAD, there was bifurcating diagonal vessel. Just after this  diagonal vessel, there was 85-90% stenosis and at the diagonal vessel,  the LAD had mild 20%  narrowing. The LAD extended to the apex and  wrapped around the distal portion.  The circumflex vessel had 30-40% smooth narrowing proximally for giving  rise to the marginal vessel.  The right coronary artery was a large caliber dominant vessel, which  supplied a large PDA inferior LV and large posterolateral system.  RAO ventriculography revealed moderate LV dysfunction with an ejection  fraction of approximately 40-45%. There was significant  hypocontractility of the distal anterolateral wall apex and distal  inferolateral segment. There did appear to be the base of the ventricle  contracted vigorously. There did appear to be at least 2+ angiographic  mitral regurgitation.  Following percutaneous coronary intervention to the LAD with PTCA,  stenting with a 2.5 x 16 mm Promus DES stent post dilated to  approximately 2.8 mm, the 85-90% LAD stenosis was reduced to 0%. There  was brisk TIMI-3 flow. The stent was positioned just after the diagonal  takeoff.  IMPRESSION:  1. Acute coronary syndrome secondary to high-grade 85-90% left  anterior descending coronary artery stenosis after the takeoff of  the bifurcating proximal diagonal vessel.  2. 30-40% narrowing in the proximal circumflex coronary artery.  3. Normal large dominant right coronary artery.  4. Moderate acute left ventricular dysfunction with significant  hypocontractility involving the distal anterolateral apical and  distal inferior wall with 2+ angiographic regurgitation.  5. Left ventricle to ascending aorta gradient suggestive of aortic  stenosis with 25 mm gradient.     FOLLOW UP PLANS AND APPOINTMENTS Discharge Orders    Future Appointments: Provider: Department: Dept Phone: Center:   06/02/2011 2:45 PM Iran Ouch, MD [facility] [phone] [facility]     Current Discharge Medication List    START taking these medications   Details  aspirin EC 81 MG EC tablet Take 1 tablet (81 mg total) by  mouth daily. Qty: 30 tablet, Refills: 11    clopidogrel (PLAVIX) 75 MG tablet Take 1 tablet (75 mg total) by mouth daily with breakfast. Qty: 30 tablet, Refills: 11    metoprolol succinate (TOPROL XL) 25 MG 24 hr tablet Take 1 tablet (25 mg total) by mouth daily. Qty: 30 tablet, Refills: 11    nitroGLYCERIN (NITROSTAT) 0.3 MG SL tablet Place 1 tablet (0.3 mg total) under the tongue every 5 (five) minutes as needed for chest pain. Qty: 25 tablet, Refills: 3    rosuvastatin (CRESTOR) 40 MG tablet Take 1 tablet (40 mg total) by mouth daily at 6 PM. Qty: 30 tablet, Refills: 11      CONTINUE these medications which have NOT CHANGED   Details  beclomethasone (BECONASE-AQ) 42 MCG/SPRAY nasal spray Place 1 spray into the nose 2 (two) times daily. Dose is for each nostril.       STOP taking these medications     lisinopril (PRINIVIL,ZESTRIL) 40 MG tablet          BRING ALL MEDICATIONS WITH YOU TO FOLLOW UP APPOINTMENTS  Time spent with patient to include physician time: 40 MIN Signed: Theodore Demark 05/16/2011, 10:29 AM Co-Sign MD

## 2011-05-16 NOTE — Progress Notes (Signed)
Pt read and understood all discharge instructions Victoria Short

## 2011-05-16 NOTE — Progress Notes (Signed)
CARDIAC REHAB PHASE I   PRE:  Rate/Rhythm: 75 SR  BP:  Supine: 100/50  Sitting:   Standing:    SaO2: 96% RA  MODE:  Ambulation: 726ft.     POST:  Rate/Rhythem: 81  BP:  Supine: 130/80  Sitting:   Standing:    SaO2: 98 % RA 1405-1435 Tolerated ambulation well without c/o of pain or SOB. Pt.back to bed after walk with call light in reach. Beatrix Fetters

## 2011-06-02 ENCOUNTER — Ambulatory Visit (INDEPENDENT_AMBULATORY_CARE_PROVIDER_SITE_OTHER): Payer: Medicare Other | Admitting: Cardiovascular Disease

## 2011-06-02 ENCOUNTER — Encounter: Payer: Self-pay | Admitting: Cardiovascular Disease

## 2011-06-02 DIAGNOSIS — I5181 Takotsubo syndrome: Secondary | ICD-10-CM

## 2011-06-02 DIAGNOSIS — I10 Essential (primary) hypertension: Secondary | ICD-10-CM

## 2011-06-02 DIAGNOSIS — E785 Hyperlipidemia, unspecified: Secondary | ICD-10-CM

## 2011-06-02 DIAGNOSIS — I2510 Atherosclerotic heart disease of native coronary artery without angina pectoris: Secondary | ICD-10-CM | POA: Insufficient documentation

## 2011-06-02 DIAGNOSIS — I422 Other hypertrophic cardiomyopathy: Secondary | ICD-10-CM

## 2011-06-02 NOTE — Patient Instructions (Signed)
Your physician recommends that you go to the [facility] for lab work in 2 weeks for FLP & LFT.  Reminder:  Nothing to eat or drink after 12 midnight prior to labs. If the results of your test are normal or stable, you will receive a letter.  If they are abnormal, the nurse will contact you by phone. Cardiac Rehab at [facility] - they will contact with appointment Your physician wants you to follow up in:  3 months.  You will receive a reminder letter in the mail one-two months in advance.  If you don't receive a letter, please call our office to schedule the follow up appointment

## 2011-06-02 NOTE — Assessment & Plan Note (Signed)
The patient has hypertrophic cardiomyopathy of the elderly with mild LVOT obstruction. I suspect that this was the cause of her hypotension after recent event. It is well known to stress-induced cardiomyopathy can worsen LVOT obstruction. Continue treatment with Toprol. Avoid vasodilators and diuretics.

## 2011-06-02 NOTE — Assessment & Plan Note (Signed)
Request a followup fasting lipid profile in 2 weeks from now as she was started on simvastatin.

## 2011-06-02 NOTE — Progress Notes (Signed)
HPI  This is an 77 year old female who is here today for a followup visit. The patient presented recently in early November 2 [facility] hospital with chest pain. She ruled out for myocardial infarction. She had an echocardiogram done which showed evidence of mild LVOT obstruction with normal ejection fraction. She had a nuclear stress test done which was normal. She was discharged home but returned after one day with severe chest pain and mildly elevated cardiac enzymes. She was transferred to [facility]. She was placed on a nitroglycerin but had significant hypotension and thus was taken urgently for cardiac catheterization which showed an 80% hazy lesion in the mid LAD. She underwent an angioplasty and drug-eluting stent placement. Left ventricular angiography showed moderately reduced LV systolic function with severe hypokinesis of the distal anterior, apical and distal inferior wall. The appearance was suggestive of stress-induced cardiomyopathy and did not correlate with the LAD distribution which did not supply the inferior wall. The patient was hypotensive after the procedure and was placed initially in dopamine which worsened her condition. She was then switched to Levophed which was subsequently weaned off successfully. Lisinopril was discontinued and instead she was started on Toprol. The patient has been doing very well since hospital discharge. She feels much better overall. She denies any chest pain, dyspnea or palpitations.  Allergies  Allergen Reactions  . Iodine Nausea And Vomiting  . Shellfish Allergy     Unsteady gait - No anaphylaxis     Current Outpatient Prescriptions on File Prior to Visit  Medication Sig Dispense Refill  . aspirin EC 81 MG EC tablet Take 1 tablet (81 mg total) by mouth daily.  30 tablet  11  . beclomethasone (BECONASE-AQ) 42 MCG/SPRAY nasal spray Place 1 spray into the nose 2 (two) times daily. Dose is for each nostril.       . clopidogrel (PLAVIX) 75 MG  tablet Take 1 tablet (75 mg total) by mouth daily with breakfast.  30 tablet  11  . metoprolol succinate (TOPROL XL) 25 MG 24 hr tablet Take 1 tablet (25 mg total) by mouth daily.  30 tablet  11  . nitroGLYCERIN (NITROSTAT) 0.3 MG SL tablet Place 1 tablet (0.3 mg total) under the tongue every 5 (five) minutes as needed for chest pain.  25 tablet  3  . simvastatin (ZOCOR) 40 MG tablet Take 1 tablet (40 mg total) by mouth at bedtime.  30 tablet  11     Past Medical History  Diagnosis Date  . Hypertension   . Angina   . Sleep apnea   . Blood transfusion   . GERD (gastroesophageal reflux disease)   . Myocardial infarction 05/2011  . Pneumonia   . Anemia   . Heart murmur   . Stress-induced cardiomyopathy 05/2011  . Hyperlipidemia   . Coronary artery disease     LAD PCI in 05/2011 for NSTEMI     Past Surgical History  Procedure Date  . Appendectomy   . Partial hysterectomy   . Abdominal hysterectomy   . Wisdom tooth extraction   . Cardiac catheterization 05/2011    80% mid LAD, 40% LCX. EF: 35-40%.   . Coronary angioplasty with stent placement 05/2011    LAD: 2.5 X 16 mm Promus DES     Family History  Problem Relation Age of Onset  . Heart attack Mother 83  . Diabetic kidney disease Father 80  . Prostate cancer Father      History   Social History  .  Marital Status: Married    Spouse Name: N/A    Number of Children: N/A  . Years of Education: N/A   Occupational History  . Not on file.   Social History Main Topics  . Smoking status: Former Smoker -- 1.0 packs/day for 18 years    Types: Cigarettes    Quit date: 05/11/1979  . Smokeless tobacco: Not on file  . Alcohol Use: No  . Drug Use: No  . Sexually Active: Yes    Birth Control/ Protection: Post-menopausal   Other Topics Concern  . Not on file   Social History Narrative  . No narrative on file     PHYSICAL EXAM   BP 117/70  Pulse 71  Ht 5\' 5"  (1.651 m)  Wt 137 lb (62.143 kg)  BMI 22.80  kg/m2  Constitutional: She is oriented to person, place, and time. She appears well-developed and well-nourished. No distress.  HENT: No nasal discharge.  Head: Normocephalic and atraumatic.  Eyes: Pupils are equal, round, and reactive to light. Right eye exhibits no discharge. Left eye exhibits no discharge.  Neck: Normal range of motion. Neck supple. No JVD present. No thyromegaly present.  Cardiovascular: Normal rate, regular rhythm, normal heart sounds and intact distal pulses. Exam reveals no gallop and no friction rub.  Is a 2/6 systolic ejection murmur at the left sternal border  Pulmonary/Chest: Effort normal and breath sounds normal. No stridor. No respiratory distress. She has no wheezes. She has no rales. She exhibits no tenderness.  Abdominal: Soft. Bowel sounds are normal. She exhibits no distension. There is no tenderness. There is no rebound and no guarding.  Musculoskeletal: Normal range of motion. She exhibits no edema and no tenderness.  Neurological: She is alert and oriented to person, place, and time. Coordination normal.  Skin: Skin is warm and dry. No rash noted. She is not diaphoretic. No erythema. No pallor.  Psychiatric: She has a normal mood and affect. Her behavior is normal. Judgment and thought content normal.  Right groin: No evidence of hematoma.     ASSESSMENT AND PLAN

## 2011-06-02 NOTE — Assessment & Plan Note (Signed)
The patient seems to be doing very well at this time. It appears that she had a plaque rupture in the left anterior descending artery. She had an angioplasty and drug-eluting stent placement. I recommend continuing dual antiplatelet therapy for at least one year. She will be referred to cardiac rehabilitation. Lifestyle changes were discussed with the patient.

## 2011-06-02 NOTE — Assessment & Plan Note (Signed)
The patient had evidence of stress-induced cardiomyopathy. Continue treatment with Toprol. We'll recheck your echocardiogram in few months from now.

## 2011-06-22 ENCOUNTER — Encounter (HOSPITAL_COMMUNITY)
Admission: AD | Disposition: A | Discharge status: Home / Self Care | Payer: Self-pay | Source: Other Acute Inpatient Hospital | Attending: Cardiovascular Disease

## 2011-06-22 ENCOUNTER — Inpatient Hospital Stay (HOSPITAL_COMMUNITY)
Admission: AD | Admit: 2011-06-22 | Discharge: 2011-06-23 | DRG: 287 | Disposition: A | Discharge status: Home / Self Care | Payer: Medicare Other | Source: Other Acute Inpatient Hospital | Attending: Cardiovascular Disease | Admitting: Cardiovascular Disease

## 2011-06-22 ENCOUNTER — Other Ambulatory Visit: Payer: Self-pay

## 2011-06-22 DIAGNOSIS — I2510 Atherosclerotic heart disease of native coronary artery without angina pectoris: Secondary | ICD-10-CM | POA: Insufficient documentation

## 2011-06-22 DIAGNOSIS — I428 Other cardiomyopathies: Secondary | ICD-10-CM | POA: Diagnosis present

## 2011-06-22 DIAGNOSIS — E785 Hyperlipidemia, unspecified: Secondary | ICD-10-CM | POA: Insufficient documentation

## 2011-06-22 DIAGNOSIS — Z9861 Coronary angioplasty status: Secondary | ICD-10-CM

## 2011-06-22 DIAGNOSIS — I200 Unstable angina: Secondary | ICD-10-CM

## 2011-06-22 DIAGNOSIS — I10 Essential (primary) hypertension: Secondary | ICD-10-CM | POA: Diagnosis not present

## 2011-06-22 DIAGNOSIS — I214 Non-ST elevation (NSTEMI) myocardial infarction: Secondary | ICD-10-CM | POA: Diagnosis present

## 2011-06-22 DIAGNOSIS — R0681 Apnea, not elsewhere classified: Secondary | ICD-10-CM

## 2011-06-22 HISTORY — PX: CARDIAC CATHETERIZATION: SHX172

## 2011-06-22 HISTORY — PX: LEFT HEART CATHETERIZATION WITH CORONARY ANGIOGRAM: SHX5451

## 2011-06-22 LAB — COMPREHENSIVE METABOLIC PANEL
ALT: 8 U/L (ref 0–35)
AST: 14 U/L (ref 0–37)
Alkaline Phosphatase: 72 U/L (ref 39–117)
CO2: 28 mEq/L (ref 19–32)
Calcium: 9.1 mg/dL (ref 8.4–10.5)
Glucose, Bld: 140 mg/dL — ABNORMAL HIGH (ref 70–99)
Potassium: 4.4 mEq/L (ref 3.5–5.1)
Sodium: 142 mEq/L (ref 135–145)
Total Protein: 6.3 g/dL (ref 6.0–8.3)

## 2011-06-22 LAB — CARDIAC PANEL(CRET KIN+CKTOT+MB+TROPI)
CK, MB: 3.1 ng/mL (ref 0.3–4.0)
CK, MB: 2.6 ng/mL (ref 0.3–4.0)
Relative Index: INVALID (ref 0.0–2.5)
Troponin I: <0.3 ng/mL (ref <0.30)

## 2011-06-22 LAB — DIFFERENTIAL
Basophils Absolute: 0 10*3/uL (ref 0.0–0.1)
Eosinophils Absolute: 0 10*3/uL (ref 0.0–0.7)
Eosinophils Relative: 0 % (ref 0–5)
Lymphocytes Relative: 10 % — ABNORMAL LOW (ref 12–46)
Lymphs Abs: 0.5 10*3/uL — ABNORMAL LOW (ref 0.7–4.0)
Neutrophils Relative %: 89 % — ABNORMAL HIGH (ref 43–77)

## 2011-06-22 LAB — CBC
MCH: 31.3 pg (ref 26.0–34.0)
MCV: 96.4 fL (ref 78.0–100.0)
Platelets: 222 10*3/uL (ref 150–400)
RDW: 13.3 % (ref 11.5–15.5)
WBC: 4.9 10*3/uL (ref 4.0–10.5)

## 2011-06-22 LAB — APTT: aPTT: 151 seconds — ABNORMAL HIGH (ref 24–37)

## 2011-06-22 LAB — MRSA PCR SCREENING: MRSA by PCR: NEGATIVE

## 2011-06-22 SURGERY — LEFT HEART CATHETERIZATION WITH CORONARY ANGIOGRAM
Anesthesia: LOCAL

## 2011-06-22 MED ORDER — ASPIRIN EC 81 MG PO TBEC
81.0000 mg | DELAYED_RELEASE_TABLET | Freq: Every day | ORAL | Status: DC
  Filled 2011-06-22: qty 1

## 2011-06-22 MED ORDER — ASPIRIN 81 MG PO CHEW
324.0000 mg | CHEWABLE_TABLET | ORAL | Status: AC
  Administered 2011-06-22: 324 mg via ORAL

## 2011-06-22 MED ORDER — HEPARIN (PORCINE) IN NACL 2-0.9 UNIT/ML-% IJ SOLN
INTRAMUSCULAR | Status: AC
  Filled 2011-06-22: qty 1000

## 2011-06-22 MED ORDER — ACETAMINOPHEN 325 MG PO TABS: 650.0000 mg | ORAL_TABLET | ORAL | Status: DC | PRN

## 2011-06-22 MED ORDER — HEPARIN BOLUS VIA INFUSION
2500.0000 [IU] | Freq: Once | INTRAVENOUS | Status: DC
  Filled 2011-06-22: qty 2500

## 2011-06-22 MED ORDER — NITROGLYCERIN 0.4 MG SL SUBL
0.4000 mg | SUBLINGUAL_TABLET | SUBLINGUAL | Status: DC | PRN
  Administered 2011-06-23: 0.4 mg via SUBLINGUAL
  Filled 2011-06-22: qty 25

## 2011-06-22 MED ORDER — HEPARIN SODIUM (PORCINE) 1000 UNIT/ML IJ SOLN
INTRAMUSCULAR | Status: AC
  Filled 2011-06-22: qty 1

## 2011-06-22 MED ORDER — SIMVASTATIN 40 MG PO TABS
40.0000 mg | ORAL_TABLET | Freq: Every day | ORAL | Status: DC
  Administered 2011-06-22: 40 mg via ORAL
  Filled 2011-06-22 (×2): qty 1

## 2011-06-22 MED ORDER — FLUTICASONE PROPIONATE 50 MCG/ACT NA SUSP
1.0000 | Freq: Every day | NASAL | Status: DC
  Administered 2011-06-23: 1 via NASAL
  Filled 2011-06-22: qty 16

## 2011-06-22 MED ORDER — SODIUM CHLORIDE 0.9 % IV SOLN: 250.0000 mL | INTRAVENOUS | Status: DC | PRN

## 2011-06-22 MED ORDER — DIPHENHYDRAMINE HCL 50 MG/ML IJ SOLN
INTRAMUSCULAR | Status: AC
  Filled 2011-06-22: qty 1

## 2011-06-22 MED ORDER — SODIUM CHLORIDE 0.9 % IV SOLN: INTRAVENOUS | Status: AC

## 2011-06-22 MED ORDER — VERAPAMIL HCL 2.5 MG/ML IV SOLN
INTRAVENOUS | Status: AC
  Filled 2011-06-22: qty 2

## 2011-06-22 MED ORDER — ASPIRIN 81 MG PO CHEW
CHEWABLE_TABLET | ORAL | Status: AC
  Administered 2011-06-22: 324 mg via ORAL
  Filled 2011-06-22: qty 4

## 2011-06-22 MED ORDER — LIDOCAINE HCL (PF) 1 % IJ SOLN
INTRAMUSCULAR | Status: AC
  Filled 2011-06-22: qty 30

## 2011-06-22 MED ORDER — CLOPIDOGREL BISULFATE 75 MG PO TABS: 75.0000 mg | ORAL_TABLET | Freq: Every day | ORAL | Status: DC

## 2011-06-22 MED ORDER — NITROGLYCERIN 0.2 MG/ML ON CALL CATH LAB
INTRAVENOUS | Status: AC
  Filled 2011-06-22: qty 1

## 2011-06-22 MED ORDER — FAMOTIDINE IN NACL 20-0.9 MG/50ML-% IV SOLN
INTRAVENOUS | Status: AC
  Filled 2011-06-22: qty 50

## 2011-06-22 MED ORDER — METHYLPREDNISOLONE SODIUM SUCC 125 MG IJ SOLR
INTRAMUSCULAR | Status: AC
  Filled 2011-06-22: qty 2

## 2011-06-22 MED ORDER — CLOPIDOGREL BISULFATE 75 MG PO TABS
ORAL_TABLET | ORAL | Status: AC
  Filled 2011-06-22: qty 1

## 2011-06-22 MED ORDER — METHYLPREDNISOLONE SODIUM SUCC 125 MG IJ SOLR
125.0000 mg | INTRAMUSCULAR | Status: AC
  Administered 2011-06-22: 125 mg via INTRAVENOUS

## 2011-06-22 MED ORDER — ASPIRIN 81 MG PO CHEW
CHEWABLE_TABLET | ORAL | Status: AC
  Filled 2011-06-22: qty 4

## 2011-06-22 MED ORDER — HEPARIN SOD (PORCINE) IN D5W 100 UNIT/ML IV SOLN
800.0000 [IU]/h | INTRAVENOUS | Status: DC
  Filled 2011-06-22: qty 250

## 2011-06-22 MED ORDER — HEPARIN (PORCINE) IN NACL 2-0.9 UNIT/ML-% IJ SOLN
INTRAMUSCULAR | Status: AC
  Filled 2011-06-22: qty 2000

## 2011-06-22 MED ORDER — NITROGLYCERIN IN D5W 200-5 MCG/ML-% IV SOLN: 3.0000 ug/min | INTRAVENOUS | Status: DC

## 2011-06-22 MED ORDER — FAMOTIDINE IN NACL 20-0.9 MG/50ML-% IV SOLN
20.0000 mg | INTRAVENOUS | Status: AC
  Administered 2011-06-22: 20 mg via INTRAVENOUS

## 2011-06-22 MED ORDER — ONDANSETRON HCL 4 MG/2ML IJ SOLN: 4.0000 mg | Freq: Four times a day (QID) | INTRAMUSCULAR | Status: DC | PRN

## 2011-06-22 MED ORDER — MIDAZOLAM HCL 2 MG/2ML IJ SOLN
INTRAMUSCULAR | Status: AC
  Filled 2011-06-22: qty 2

## 2011-06-22 MED ORDER — ASPIRIN 81 MG PO CHEW: 324.0000 mg | CHEWABLE_TABLET | ORAL | Status: DC

## 2011-06-22 MED ORDER — DIAZEPAM 5 MG PO TABS
ORAL_TABLET | ORAL | Status: AC
  Administered 2011-06-22: 5 mg via ORAL
  Filled 2011-06-22: qty 1

## 2011-06-22 MED ORDER — NITROGLYCERIN 0.3 MG SL SUBL
0.3000 mg | SUBLINGUAL_TABLET | SUBLINGUAL | Status: DC | PRN
  Filled 2011-06-22: qty 100

## 2011-06-22 MED ORDER — ASPIRIN EC 81 MG PO TBEC
81.0000 mg | DELAYED_RELEASE_TABLET | Freq: Every day | ORAL | Status: DC
  Administered 2011-06-23: 81 mg via ORAL
  Filled 2011-06-22: qty 1

## 2011-06-22 MED ORDER — METOPROLOL SUCCINATE ER 25 MG PO TB24
25.0000 mg | ORAL_TABLET | Freq: Every day | ORAL | Status: DC
  Administered 2011-06-23: 25 mg via ORAL
  Filled 2011-06-22 (×2): qty 1

## 2011-06-22 MED ORDER — DIPHENHYDRAMINE HCL 50 MG/ML IJ SOLN
25.0000 mg | INTRAMUSCULAR | Status: AC
  Administered 2011-06-22: 07:00:00 via INTRAVENOUS

## 2011-06-22 MED ORDER — ISOSORBIDE MONONITRATE ER 30 MG PO TB24
30.0000 mg | ORAL_TABLET | Freq: Every day | ORAL | Status: DC
  Administered 2011-06-22 – 2011-06-23 (×2): 30 mg via ORAL
  Filled 2011-06-22 (×2): qty 1

## 2011-06-22 MED ORDER — BIOTENE DRY MOUTH MT LIQD
15.0000 mL | Freq: Two times a day (BID) | OROMUCOSAL | Status: DC
  Administered 2011-06-22 – 2011-06-23 (×2): 15 mL via OROMUCOSAL

## 2011-06-22 MED ORDER — SODIUM CHLORIDE 0.9 % IV SOLN
1.0000 mL/kg/h | INTRAVENOUS | Status: DC
  Administered 2011-06-22: 1 mL/kg/h via INTRAVENOUS

## 2011-06-22 MED ORDER — DIAZEPAM 5 MG PO TABS
5.0000 mg | ORAL_TABLET | ORAL | Status: AC
  Administered 2011-06-22: 5 mg via ORAL

## 2011-06-22 MED ORDER — SODIUM CHLORIDE 0.9 % IJ SOLN: 3.0000 mL | INTRAMUSCULAR | Status: DC | PRN

## 2011-06-22 MED ORDER — SODIUM CHLORIDE 0.9 % IJ SOLN
3.0000 mL | Freq: Two times a day (BID) | INTRAMUSCULAR | Status: DC
  Administered 2011-06-22: 3 mL via INTRAVENOUS

## 2011-06-22 MED ORDER — TICAGRELOR 90 MG PO TABS
90.0000 mg | ORAL_TABLET | Freq: Two times a day (BID) | ORAL | Status: DC
  Administered 2011-06-22 – 2011-06-23 (×2): 90 mg via ORAL
  Filled 2011-06-22 (×4): qty 1

## 2011-06-22 NOTE — Progress Notes (Signed)
SUBJECTIVE: no more chest pain. Off NTG drip.   Filed Vitals:   06/22/11 0758 06/22/11 1031 06/22/11 1100 06/22/11 1500  BP:   118/50 99/47  Pulse:  73 69 73  Temp: 97.5 F (36.4 C)  97.4 F (36.3 C) 97.8 F (36.6 C)  TempSrc: Oral  Oral Oral  Resp:   16 21  Height:      Weight:      SpO2:   95% 96%    Intake/Output Summary (Last 24 hours) at 06/22/11 1750 Last data filed at 06/22/11 0700  Gross per 24 hour  Intake      0 ml  Output    500 ml  Net   -500 ml    LABS: Basic Metabolic Panel:  Basename 06/22/11 1242  NA 142  K 4.4  CL 107  CO2 28  GLUCOSE 140*  BUN 12  CREATININE 0.61  CALCIUM 9.1  MG 1.9  PHOS --   Liver Function Tests:  Basename 06/22/11 1242  AST 14  ALT 8  ALKPHOS 72  BILITOT 0.8  PROT 6.3  ALBUMIN 3.1*   No results found for this basename: LIPASE:2,AMYLASE:2 in the last 72 hours CBC:  Basename 06/22/11 1242  WBC 4.9  NEUTROABS 4.4  HGB 11.3*  HCT 34.8*  MCV 96.4  PLT 222   Cardiac Enzymes:  Basename 06/22/11 1242  CKTOTAL 94  CKMB 3.1  CKMBINDEX --  TROPONINI <0.30   BNP: No components found with this basename: POCBNP:3 D-Dimer:  Basename 06/22/11 1654  DDIMER 0.42     TELEMETRY: Reviewed telemetry pt in [facility].   ASSESSMENT AND PLAN:  1. Small NSTEMI: cath showed patent stent. No more chest pain. D-dimer was normal. Imdur was added. Her PRU is 232 while on Plavix which is not optimal and might have contributed to her presentation. I will switch her to Brilinta. Will likely need to assist her with this given that she does not have an Rx coverage.  2. Recent stress induced cardiomyopathy. EF is back to normal.  Likely discharge tomorrow with follow up with me in few weeks.   Lorine Bears, MD, [facility] 06/22/2011 5:50 PM

## 2011-06-22 NOTE — H&P (Signed)
Admit date: 06/22/2011 Referring Physician  [facility] Primary Cardiologist  Dr. Kirke Corin Chief complaint/reason for admission:  Chest Pain  HPI: This is a 77yo WF with history of recent admission 05/08/2011 and ruled out for MI.  She underwent nuclear stress test which was normal and echo showed NL LVF with SAM and basal septal hypertrophy and she was sent home.  She had recurrent CP and presented to ER and rule in for NSTEMI.  She had problems with hypotension and recurrent CP and had an emergent cath done on 11/8 which revealed an LAD stenosis and underwent PCI with DES.  Cath also revealed a stress induced cardiomyopathy with EF 40-45% and distal anterolateral apical and distal inferior wall hypokinesis.  Following the procedure she developed hypotension with BP sustained in the 70's systolic and was treated with pressors.  She slowly improved and was eventually discharged to home.  Since then she has been doing well and was supposed to start cardiac rehab today.  Last PM she developed SSCP after going to bed similar to her prior MI.  She denied any SOB, nausea or diaphoresis.The pain was in her upper chest radiating into her throat and neck and into her right ear.  She took 2 NTG SL and a baby ASA with some mild relief.    She went to [facility] hospital ER.  Her pain resolved after 3 more SL NTG.  She was transferred to [facility] this am.  On arrival she was pain free but has now developed recurrent CP at the time of her admission and currently her pain is 5/10.    PMH:    Past Medical History  Diagnosis Date  . Hypertension   . Angina   . Sleep apnea   . Blood transfusion   . GERD (gastroesophageal reflux disease)   . Myocardial infarction 05/2011  . Pneumonia   . Anemia   . Heart murmur   . Stress-induced cardiomyopathy EF 40-45% with distal anterolateral/apical/distal inferior HK 05/2011  . Hyperlipidemia   . Coronary artery disease     LAD PCI in 05/2011 for NSTEMI  . Hypertrophic  cardiomyopathy     PSH:    Past Surgical History  Procedure Date  . Appendectomy   . Partial hysterectomy   . Abdominal hysterectomy   . Wisdom tooth extraction   . Cardiac catheterization 05/2011    80% mid LAD, 40% LCX. EF: 35-40%.   . Coronary angioplasty with stent placement 05/2011    LAD: 2.5 X 16 mm Promus DES    ALLERGIES:   Iodine and Shellfish allergy  Prior to Admit Meds:   Prescriptions prior to admission  Medication Sig Dispense Refill  . aspirin EC 81 MG EC tablet Take 1 tablet (81 mg total) by mouth daily.  30 tablet  11  . beclomethasone (BECONASE-AQ) 42 MCG/SPRAY nasal spray Place 1 spray into the nose 2 (two) times daily. Dose is for each nostril.       . clopidogrel (PLAVIX) 75 MG tablet Take 1 tablet (75 mg total) by mouth daily with breakfast.  30 tablet  11  . metoprolol succinate (TOPROL XL) 25 MG 24 hr tablet Take 1 tablet (25 mg total) by mouth daily.  30 tablet  11  . nitroGLYCERIN (NITROSTAT) 0.3 MG SL tablet Place 1 tablet (0.3 mg total) under the tongue every 5 (five) minutes as needed for chest pain.  25 tablet  3  . simvastatin (ZOCOR) 40 MG tablet Take 1 tablet (40  mg total) by mouth at bedtime.  30 tablet  11   Family HX:    Family History  Problem Relation Age of Onset  . Heart attack Mother 3  . Diabetic kidney disease Father 70  . Prostate cancer Father    Social HX:    History   Social History  . Marital Status: Married    Spouse Name: N/A    Number of Children: N/A  . Years of Education: N/A   Occupational History  . Not on file.   Social History Main Topics  . Smoking status: Former Smoker -- 1.0 packs/day for 18 years    Types: Cigarettes    Quit date: 05/11/1979  . Smokeless tobacco: Not on file  . Alcohol Use: No  . Drug Use: No  . Sexually Active: Yes    Birth Control/ Protection: Post-menopausal   Other Topics Concern  . Not on file   Social History Narrative  . No narrative on file     ROS:  All 11 ROS were  addressed and are negative except what is stated in the HPI  PHYSICAL EXAM Filed Vitals:   06/22/11 0500  BP: 121/51  Pulse: 66  Temp: 97.4 F (36.3 C)  Resp: 20   General: Well developed, well nourished, in no acute distress Head: Eyes PERRLA, No xanthomas.   Normal cephalic and atramatic  Lungs:   Clear bilaterally to auscultation and percussion. Heart:   HRRR S1 S2 Pulses are 2+ & equal.            No carotid bruit. No JVD.  No abdominal bruits. No femoral bruits. Abdomen: Bowel sounds are positive, abdomen soft and non-tender without masses  Extremities:   No clubbing, cyanosis or edema.  DP +1 Neuro: Alert and oriented X 3. Psych:  Good affect, responds appropriately   Labs:   Lab Results  Component Value Date   WBC 6.2 05/13/2011   HGB 11.3* 05/13/2011   HCT 34.5* 05/13/2011   MCV 98.9 05/13/2011   PLT 196 05/13/2011    Lab Results  Component Value Date   CKTOTAL 38 05/13/2011   CKMB 4.1* 05/13/2011   TROPONINI 0.45* 05/13/2011   No results found for this basename: PTT   Lab Results  Component Value Date   INR 1.06 05/12/2011         Radiology:  pending  EKG:  NSR with deeply inverted T waves in the anterolateral leads - no change from EKG 05/16/2011  ASSESSMENT:  1.  [facility]AP with negative enzymes at [facility].  Initial set pending at [facility].  EKG shows NSR with deeply inverted T waves in the anterolateral leads with no change in EKG when compared to EKG of 05/16/2011 2.  S/P recent NSTEMI with PCI of LAD 3.  CAD with PCI of LAD 05/2011 4.  Stress induced CM 5.  HOCM 6.  Hypertension 7.  Nasal polyp 8.  IV contrast dye allergy  PLAN:   1.  Admit to CCU 2.  Cycle cardiac enzymes 3.  IV Heparin gtt 4.  IV NTG gtt titrate for chest pain 5.  Continue home meds 6.  NPO 7.  Cath this am - will give Solumedrol 125mg  IV now for dye allergy  Quintella Reichert, MD  06/22/2011  6:22 AM

## 2011-06-22 NOTE — Op Note (Addendum)
Cardiac Catheterization Procedure Note  Name: Victoria Short MRN: [id number] DOB: [birth year] Procedure: Left Heart Cath, Selective Coronary Angiography, LV angiography  Indication: Non-ST elevation myocardial infarction. This is an 77 year old female who recently had non-ST elevation myocardial infarction. She underwent an angioplasty and drug-eluting stent placement to the mid left anterior descending artery. She also had stress-induced cardiomyopathy at the same time. The patient was doing well up until last night when she had chest tightness similar to her previous myocardial infarction. Her initial cardiac enzymes were negative. However, subsequently she are within with a slight elevation in troponin. She was also having chest pain that responded to nitroglycerin. Cardiac catheterization was thus recommended.   Procedural Details: The right wrist was prepped, draped, and anesthetized with 1% lidocaine. Using the modified Seldinger technique, a 5 French sheath was introduced into the right radial artery. 3 mg of verapamil was administered through the sheath, weight-based unfractionated heparin was administered intravenously. A TIG 4 catheter was used for selective coronary angiography. A pigtail catheter was used for left ventriculography. Catheter exchanges were performed over an exchange length guidewire. There were no immediate procedural complications. A TR band was used for radial hemostasis at the completion of the procedure.  The patient was transferred to the post catheterization recovery area for further monitoring.  Procedural Findings: Hemodynamics: AO 90/47 mmHg. LV 103/8. EDP: 15 mm mercury. A mild gradient is noted which is likely due to her known history of LVOT obstruction.   Coronary angiography: Coronary dominance: right  Left mainstem: The vessel is normal in size with mild distal narrowing but no significant disease.  Left anterior descending (LAD): The vessel is normal in  size and mildly calcified especially in the proximal and midsegment. The stent is noted in the midsegment which is patent without significant restenosis. In the midsegment proximal to the stent there is a tubular 30% stenosis. First diagonal is medium in size and free of significant disease. Second diagonal is a large branching vessel with no significant disease. It supplies the third diagonal distribution. The distal LAD is free of significant disease.  Left circumflex (LCx): The vessel is normal in size and nondominant. There is a 30% stenosis in the midsegment. OM1 is a small size branch. OM 2 is a large size branch and free of significant disease. OM 3 is medium in size.  Right coronary artery (RCA): The vessel is large in size and dominant. There is a diffuse 50% disease in the proximal segment extending into the midsegment. The rest of the vessel is free of any significant disease. Mild calcifications are noted proximally. The PDA is large in size and free of significant disease. The posterolateral branches are also relatively large in size and free of significant disease.  Left ventriculography: Left ventricular systolic function is normal, LVEF is estimated at 55-60%, there is no significant mitral regurgitation . The distal anterior and apical wall motion abnormalities that were noted during her recent catheterization have resolved.  Final Conclusions:  1. No evidence of obstructive coronary artery disease on current coronary angiography. Patent stent in the mid LAD without significant restenosis. 2. Normalization of LV systolic function. 3. Mildly elevated left ventricular end-diastolic pressure. Mild LVOT gradient.  Recommendations: The patient's current episode of chest pain with slightly elevated cardiac enzymes is not entirely explainable. The quality of her symptoms appear to be very similar to her previous myocardial infarction. I don't think there is enough suspicion for pulmonary  embolism but will check d-dimer level. It is  possible that she might have endothelial dysfunction or coronary spasm. I will place her on long-acting nitroglycerin and continue other cardiac medications.  Lorine Bears 06/22/2011, 11:24 AM

## 2011-06-22 NOTE — Interval H&P Note (Signed)
History and Physical Interval Note:  06/22/2011 10:44 AM  Victoria Short  has presented today for surgery, with the diagnosis of chest pain  The various methods of treatment have been discussed with the patient. After consideration of risks, benefits and other options for treatment, the patient has consented to  Procedure(s): LEFT HEART CATHETERIZATION WITH CORONARY ANGIOGRAM as a surgical intervention .  The patients' history has been reviewed, patient examined, no change in status, stable for surgery.  I have reviewed the patients' chart and labs.  Questions were answered to the patient's satisfaction.     Lorine Bears

## 2011-06-22 NOTE — Progress Notes (Addendum)
ANTICOAGULATION CONSULT NOTE - Initial Consult  Pharmacy Consult for Heparin    Indication: chest pain/ACS  Allergies  Allergen Reactions  . Iodine Nausea And Vomiting  . Shellfish Allergy     Unsteady gait - No anaphylaxis    Patient Measurements: Height: 5\' 5"  (165.1 cm) Weight: 123 lb 3.8 oz (55.9 kg) IBW/kg (Calculated) : 57  Adjusted Body Weight:   Vital Signs: Temp: 97.4 F (36.3 C) (12/19 0500) Temp src: Oral (12/19 0500) BP: 101/46 mmHg (12/19 0600) Pulse Rate: 65  (12/19 0600)  Labs: No results found for this basename: HGB:2,HCT:3,PLT:3,APTT:3,LABPROT:3,INR:3,HEPARINUNFRC:3,CREATININE:3,CKTOTAL:3,CKMB:3,TROPONINI:3 in the last 72 hours Estimated Creatinine Clearance: 46.2 ml/min (by C-G formula based on Cr of 0.76).  Medical History: Past Medical History  Diagnosis Date  . Hypertension   . Angina   . Sleep apnea   . Blood transfusion   . GERD (gastroesophageal reflux disease)   . Myocardial infarction 05/2011  . Pneumonia   . Anemia   . Heart murmur   . Stress-induced cardiomyopathy 05/2011  . Hyperlipidemia   . Coronary artery disease     LAD PCI in 05/2011 for NSTEMI  . Hypertrophic cardiomyopathy     Medications:  Prescriptions prior to admission  Medication Sig Dispense Refill  . aspirin EC 81 MG EC tablet Take 1 tablet (81 mg total) by mouth daily.  30 tablet  11  . beclomethasone (BECONASE-AQ) 42 MCG/SPRAY nasal spray Place 1 spray into the nose 2 (two) times daily. Dose is for each nostril.       . clopidogrel (PLAVIX) 75 MG tablet Take 1 tablet (75 mg total) by mouth daily with breakfast.  30 tablet  11  . metoprolol succinate (TOPROL XL) 25 MG 24 hr tablet Take 1 tablet (25 mg total) by mouth daily.  30 tablet  11  . nitroGLYCERIN (NITROSTAT) 0.3 MG SL tablet Place 1 tablet (0.3 mg total) under the tongue every 5 (five) minutes as needed for chest pain.  25 tablet  3  . simvastatin (ZOCOR) 40 MG tablet Take 1 tablet (40 mg total) by mouth at  bedtime.  30 tablet  11    Assessment: Reccurent CP s/p cath with pci (des) to lat 11/2. Stress induced cardiomyopathy with ef 40-45%. Recurrent chest pain after arrival to [facility]. Due for cath today    Goal of Therapy:  Heparin level 0.3-0.7 units/ml   Plan:  Decrease heparin infusing from [facility] to  800 units/hr. 8 hour HL if not off for pending cath    Janice Coffin 06/22/2011,6:58 AM

## 2011-06-23 ENCOUNTER — Other Ambulatory Visit: Payer: Self-pay

## 2011-06-23 ENCOUNTER — Encounter (HOSPITAL_COMMUNITY): Payer: Self-pay | Admitting: *Deleted

## 2011-06-23 DIAGNOSIS — I200 Unstable angina: Secondary | ICD-10-CM

## 2011-06-23 MED ORDER — TICAGRELOR 90 MG PO TABS: 90.0000 mg | ORAL_TABLET | Freq: Two times a day (BID) | ORAL | Status: DC

## 2011-06-23 MED ORDER — ISOSORBIDE MONONITRATE ER 30 MG PO TB24: 30.0000 mg | ORAL_TABLET | Freq: Every day | ORAL | Status: DC

## 2011-06-23 NOTE — Progress Notes (Signed)
   CARE MANAGEMENT NOTE 06/23/2011  Patient:  Victoria Short,Victoria Short   Account Number:  [id number]  Date Initiated:  06/23/2011  Documentation initiated by:  GRAVES-BIGELOW,Victoria Short  Subjective/Objective Assessment:   Pt admitted with cp. S/p cath plan for home on brilinta.     Action/Plan:   CM spoke to pt and she does not have Rx drug coverage. She will be paying out of pocket and cash price is 277.97. PA Victoria Short is aware and he filled out pt assist forms. Rx will be written for 30 day free no refills and original with refills.   Anticipated DC Date:  06/23/2011   Anticipated DC Plan:  HOME/SELF CARE      DC Planning Services  CM consult      Choice offered to / List presented to:             Status of service:  In process, will continue to follow Medicare Important Message given?   (If response is "NO", the following Medicare IM given date fields will be blank) Date Medicare IM given:   Date Additional Medicare IM given:    Discharge Disposition:  HOME/SELF CARE  Per UR Regulation:    Comments:  06-23-11 [address] Victoria Bamberger, RN,BSN [phone] Pt will fax forms in to [employer]. Spoke to PA about pt needing samples at d/c or by next MD visit. Pt and husband will be looking into a rx drug plan. CM did call [facility] in [place] and they have one bottle available. CM spoke to Victoria Short and he will hold that bottle for pt.

## 2011-06-23 NOTE — Progress Notes (Signed)
Patient complaint of unsustained chest pain at 4 on a scale of 0-10, oxygen administered at 2 liters via nasal canula, ekg obtained without changes noted. Vital signs are stable with bp 92/44, and heart rate at 70. Patient reports 0 pain at the moment. Will continue to monitor.

## 2011-06-23 NOTE — Discharge Summary (Signed)
I have seen this pt today and written a full note. cdm

## 2011-06-23 NOTE — Progress Notes (Signed)
Patient Name: Victoria Short Date of Encounter: 06/23/2011     Principal Problem:  *Unstable angina pectoris    SUBJECTIVE: One episode of chest pain last night. Denies cp currently, sob, diaphoresis, palpitations, dizziness and n/v. Resting comfortably.    OBJECTIVE  Filed Vitals:   06/22/11 1800 06/22/11 2039 06/22/11 2309 06/23/11 0445  BP: 103/47 102/49 97/46 103/47  Pulse: 77 75 84 73  Temp:  98.3 F (36.8 C) 98 F (36.7 C) 98.1 F (36.7 C)  TempSrc:  Oral Oral Oral  Resp: 19 20 21 15   Height:      Weight:   57.3 kg (126 lb 5.2 oz)   SpO2: 94% 92% 94% 98%    Intake/Output Summary (Last 24 hours) at 06/23/11 0732 Last data filed at 06/23/11 1610  Gross per 24 hour  Intake    303 ml  Output    850 ml  Net   -547 ml   Weight change: 1.4 kg (3 lb 1.4 oz)  PHYSICAL EXAM  General: Elderly, alert, NAD Head: Normocephalic, atraumatic, sclera non-icteric  Neck: Supple without bruits or JVD. Lungs:  Resp regular and unlabored, CTAB, no wheezes, rales or rhonchi Heart: RRR no s3, s4, II/VI systolic murmur at RLSB, no rubs, heaves, thrills or gallops Abdomen: Soft, non-tender, non-distended, BS + x 4.  Msk:  Strength and tone appears normal for age. Extremities: No clubbing, cyanosis or edema. DP/PT/Radials 2+ and equal bilaterally. Neuro: Alert and oriented X 3. Moves all extremities spontaneously. Psych: Normal affect.  LABS:  Recent Labs  [facility] 06/22/11 1242   WBC 4.9   HGB 11.3*   HCT 34.8*   MCV 96.4   PLT 222    Recent Labs  Basename 06/22/11 1654   DDIMER 0.42    Lab 06/22/11 1242  NA 142  K 4.4  CL 107  CO2 28  BUN 12  CREATININE 0.61  CALCIUM 9.1  PROT 6.3  BILITOT 0.8  ALKPHOS 72  ALT 8  AST 14  AMYLASE --  LIPASE --  GLUCOSE 140*    Recent Labs  [facility] 06/22/11 1655 06/22/11 1242   CKTOTAL 73 94   CKMB 2.6 3.1   CKMBINDEX -- --   TROPONINI <0.30 <0.30    TELE: NSR, 70-80 bpm, brief apneic episodes  ECG: NSR,  67 bpm, 1st degree AV block, TWI V2-V6; improved from EKG on 12/19   Radiology/Studies:  No results found.  Current Medications:     . antiseptic oral rinse  15 mL Mouth Rinse BID  . aspirin EC  81 mg Oral Daily  . famotidine (PEPCID) IV  20 mg Intravenous On Call  . fluticasone  1 spray Each Nare Daily  . heparin      . heparin      . heparin      . isosorbide mononitrate  30 mg Oral Daily  . lidocaine      . lidocaine      . methylPREDNISolone sodium succinate      . metoprolol succinate  25 mg Oral Daily  . midazolam      . nitroGLYCERIN      . nitroGLYCERIN      . simvastatin  40 mg Oral q1800  . sodium chloride  3 mL Intravenous Q12H  . Ticagrelor  90 mg Oral BID  . verapamil      . DISCONTD: aspirin  324 mg Oral Pre-Cath  . DISCONTD: aspirin EC  81 mg Oral Daily  .  DISCONTD: clopidogrel  75 mg Oral Q breakfast  . DISCONTD: clopidogrel  75 mg Oral Q breakfast    ASSESSMENT AND PLAN:  1. NSTEMI- s/p cardiac cath revealing patent stent. D-dimer neg. One episode of chest pain last night. VSS, EKG without new ischemic changes. Asymptomatic today. Imdur added 2/2 suspicion of vasospasm, switched to Brilinta yesterday per Dr. Kirke Corin due to suboptimal PLT inhibition. Mention of assistance with Rx coverage. Will put in order for case management. She is stable. Will discuss discharge with Dr. Clifton James today.    Signed, R. Hurman Horn, PA-C 06/23/2011, 7:32 AM  I have personally seen and examined this patient. I agree with the assessment and plan as outlined above. She was admitted with a NSTEMI. Cath yesterday per Dr. Kirke Corin with stable disease. She was started on Imdur. Doing well. D/C home today and f/u with Dr. Kirke Corin in 2-3 weeks in [place] office.   Victoria Short 10:37 AM 06/23/2011

## 2011-06-23 NOTE — Discharge Summary (Signed)
Discharge Summary   Patient ID: Victoria Short,  MRN: [id number], DOB/AGE: May 27, 1925 77 y.o.  Admit date: 06/22/2011 Discharge date: 06/23/2011  Discharge Diagnoses Principal Problem:  *Unstable angina pectoris Active Problems:  Hypertension  Hyperlipidemia  Coronary artery disease   Allergies Allergies  Allergen Reactions  . Iodine Nausea And Vomiting  . Shellfish Allergy     Unsteady gait - No anaphylaxis    Procedures:   Cardiac catheterization Procedural Findings:  Hemodynamics:  AO 90/47 mmHg.  LV 103/8. EDP: 15 mm mercury.  A mild gradient is noted which is likely due to her known history of LVOT obstruction.  Coronary angiography:  Coronary dominance: right  Left mainstem: The vessel is normal in size with mild distal narrowing but no significant disease.  Left anterior descending (LAD): The vessel is normal in size and mildly calcified especially in the proximal and midsegment. The stent is noted in the midsegment which is patent without significant restenosis. In the midsegment proximal to the stent there is a tubular 30% stenosis. First diagonal is medium in size and free of significant disease. Second diagonal is a large branching vessel with no significant disease. It supplies the third diagonal distribution. The distal LAD is free of significant disease.  Left circumflex (LCx): The vessel is normal in size and nondominant. There is a 30% stenosis in the midsegment. OM1 is a small size branch. OM 2 is a large size branch and free of significant disease. OM 3 is medium in size.  Right coronary artery (RCA): The vessel is large in size and dominant. There is a diffuse 50% disease in the proximal segment extending into the midsegment. The rest of the vessel is free of any significant disease. Mild calcifications are noted proximally. The PDA is large in size and free of significant disease. The posterolateral branches are also relatively large in size and free of  significant disease.  Left ventriculography: Left ventricular systolic function is normal, LVEF is estimated at 55-60%, there is no significant mitral regurgitation . The distal anterior and apical wall motion abnormalities that were noted during her recent catheterization have resolved.  Final Conclusions:  1. No evidence of obstructive coronary artery disease on current coronary angiography. Patent stent in the mid LAD without significant restenosis.  2. Normalization of LV systolic function.  3. Mildly elevated left ventricular end-diastolic pressure. Mild LVOT gradient.  Recommendations: The patient's current episode of chest pain with slightly elevated cardiac enzymes is not entirely explainable. The quality of her symptoms appear to be very similar to her previous myocardial infarction. I don't think there is enough suspicion for pulmonary embolism but will check d-dimer level. It is possible that she might have endothelial dysfunction or coronary spasm. I will place her on long-acting nitroglycerin and continue other cardiac medications.   History of Present Illness:  Mr. Jeter is a 77 yo Caucasian female with PMHx significant for CAD (LAD PCI with DES in 05/2011 for NSTEMI), HO and stress-induced cardiomyopathies, HTN and HL who was admitted to [facility] hospital after a diagnosis of NSTEMI.   She had been doing well post-discharged for PCI s/p NSTEMI last month. On 12/18, she develoepd SSCP similar to previous MI radiating to throat, neck and right ear. She took 2 NTG and ASA 81 with mild relief. Presented to [facility] hospital ER, NTG SL x 3 with resolution. Transferred to [facility] with unstable angina diagnosis and anticipation of potential cardiac catheterization.   Hospital Course:  Upon arrival to [facility], chest  pain recurred, relieved by NTG with TWIs in anterolateral leads on EKG and mild elevation in CEs x 3. She underwent cardiac catheterization the following day which elicited the above results  including patent LAD stent, no evidence of obstructive dz, normal LV function and mildly elevated LVEDP; LVOT gradient. Etiology of NSTEMI was not entirely clear. A suspicion of endothelial dysfunction with associated vasospasm was made, and she was started on Imdur. D-dimer was WNL.   She asymptomatic immediately post-procedure off NTG gtt. She developed one episode of chest pain overnight, EKG ordered did not reveal worsening or new ischemic changes from day prior.   She is currently asymptomatic, at baseline and will be discharged home today. She was switched from Plavix to Brilinta given suboptimal antiplatelet function. She was given the appropriate Rx assistance and prescriptions. She will also be discharged on Imdur. She will follow-up at the [facility] office.   Discharge Vitals:  Blood pressure 135/51, pulse 66, temperature 98.1 F (36.7 C), temperature source Oral, resp. rate 18, height 5\' 5"  (1.651 m), weight 57.3 kg (126 lb 5.2 oz), SpO2 97.00%.   Weight change: 1.4 kg (3 lb 1.4 oz)  Labs: Recent Labs  [facility] 06/22/11 1242   WBC 4.9   HGB 11.3*   HCT 34.8*   MCV 96.4   PLT 222    Recent Labs  Basename 06/22/11 1654   DDIMER 0.42    Lab 06/22/11 1242  NA 142  K 4.4  CL 107  CO2 28  BUN 12  CREATININE 0.61  CALCIUM 9.1  PROT 6.3  BILITOT 0.8  ALKPHOS 72  ALT 8  AST 14  AMYLASE --  LIPASE --  GLUCOSE 140*   Recent Labs  Basename 06/22/11 1655 06/22/11 1242   CKTOTAL 73 94   CKMB 2.6 3.1   CKMBINDEX -- --   TROPONINI <0.30 <0.30   Disposition:  Discharge Orders    Future Appointments: Provider: Department: Dept Phone: Center:   07/18/2011 3:00 PM Iran Ouch, MD [facility] [phone] [facility]     Future Orders Please Complete By Expires   Polysomnography 4 or more parameters   06/22/12   Scheduling Instructions:   Please call patient with scheduled study.   Questions: Responses:   Where should this test be performed: [facility]     Follow-up Information    Follow up with Lorine Bears, MD on 07/18/2011. (At 3:00 PM. )    Contact information:   [address] [place] [place] [address] [phone]          Discharge Medications:  Current Discharge Medication List    START taking these medications   Details  isosorbide mononitrate (IMDUR) 30 MG 24 hr tablet Take 1 tablet (30 mg total) by mouth daily. Qty: 30 tablet, Refills: 3    Ticagrelor (BRILINTA) 90 MG TABS tablet Take 1 tablet (90 mg total) by mouth 2 (two) times daily. Qty: 60 tablet, Refills: 0      CONTINUE these medications which have NOT CHANGED   Details  aspirin EC 81 MG EC tablet Take 1 tablet (81 mg total) by mouth daily. Qty: 30 tablet, Refills: 11    beclomethasone (BECONASE-AQ) 42 MCG/SPRAY nasal spray Place 1 spray into the nose 2 (two) times daily. Dose is for each nostril.     metoprolol succinate (TOPROL XL) 25 MG 24 hr tablet Take 1 tablet (25 mg total) by mouth daily. Qty: 30 tablet, Refills: 11  simvastatin (ZOCOR) 40 MG tablet Take 1 tablet (40 mg total) by mouth at bedtime. Qty: 30 tablet, Refills: 11    nitroGLYCERIN (NITROSTAT) 0.3 MG SL tablet Place 1 tablet (0.3 mg total) under the tongue every 5 (five) minutes as needed for chest pain. Qty: 25 tablet, Refills: 3      STOP taking these medications     clopidogrel (PLAVIX) 75 MG tablet Comments:  Reason for Stopping:          Outstanding Labs/Studies: None reported  Duration of Discharge Encounter: 40 minutes including physician time.  Signed, R. Hurman Horn, PA-C 06/23/2011, 11:04 AM

## 2011-06-23 NOTE — Progress Notes (Signed)
CARDIAC REHAB PHASE I   PRE:  Rate/Rhythm: 70SR  BP:  Supine: 110/66  Sitting:   Standing:    SaO2: 95%RA  MODE:  Ambulation: 680 ft   POST:  Rate/Rhythem: 89SR  BP:  Supine:   Sitting: 135/51  Standing:    SaO2: 98%RA 0745-0830 Pt walked 680 ft with steady gait. Denied chest pain.Tolerated well. Briefly reviewed ed as pt just here last month. Will send update letter to [facility] 2 as pt was to start program soon. To chair after walk.  Duanne Limerick

## 2011-07-08 ENCOUNTER — Encounter: Payer: Self-pay | Admitting: *Deleted

## 2011-07-11 ENCOUNTER — Telehealth: Payer: Self-pay | Admitting: *Deleted

## 2011-07-11 DIAGNOSIS — E785 Hyperlipidemia, unspecified: Secondary | ICD-10-CM | POA: Diagnosis not present

## 2011-07-11 DIAGNOSIS — I10 Essential (primary) hypertension: Secondary | ICD-10-CM | POA: Diagnosis not present

## 2011-07-11 DIAGNOSIS — I252 Old myocardial infarction: Secondary | ICD-10-CM | POA: Diagnosis not present

## 2011-07-11 DIAGNOSIS — I2510 Atherosclerotic heart disease of native coronary artery without angina pectoris: Secondary | ICD-10-CM | POA: Diagnosis not present

## 2011-07-11 DIAGNOSIS — Z5189 Encounter for other specified aftercare: Secondary | ICD-10-CM | POA: Diagnosis not present

## 2011-07-11 DIAGNOSIS — Z9861 Coronary angioplasty status: Secondary | ICD-10-CM | POA: Diagnosis not present

## 2011-07-11 NOTE — Telephone Encounter (Addendum)
Patient said she was informed to come to our office to pick up samples of Brilinta 90 mg per Arida on hospital d/c and also needed order to get lipid labs at the [facility]. Nurse gave  order for lab work. Brilinta #8 samples given along with information for patient assistance. Patient was given 30 day supply free already when d/c from hospital on 12/12. Nurse informed patient that is can be discussed during office visit on Monday 14th. Patient verbalized understanding of plan.

## 2011-07-12 DIAGNOSIS — I10 Essential (primary) hypertension: Secondary | ICD-10-CM | POA: Diagnosis not present

## 2011-07-12 DIAGNOSIS — E785 Hyperlipidemia, unspecified: Secondary | ICD-10-CM | POA: Diagnosis not present

## 2011-07-12 DIAGNOSIS — I2510 Atherosclerotic heart disease of native coronary artery without angina pectoris: Secondary | ICD-10-CM | POA: Diagnosis not present

## 2011-07-12 DIAGNOSIS — I252 Old myocardial infarction: Secondary | ICD-10-CM | POA: Diagnosis not present

## 2011-07-12 DIAGNOSIS — Z9861 Coronary angioplasty status: Secondary | ICD-10-CM | POA: Diagnosis not present

## 2011-07-12 DIAGNOSIS — Z5189 Encounter for other specified aftercare: Secondary | ICD-10-CM | POA: Diagnosis not present

## 2011-07-13 ENCOUNTER — Encounter: Payer: Self-pay | Admitting: Cardiovascular Disease

## 2011-07-14 DIAGNOSIS — I2510 Atherosclerotic heart disease of native coronary artery without angina pectoris: Secondary | ICD-10-CM | POA: Diagnosis not present

## 2011-07-14 DIAGNOSIS — Z5189 Encounter for other specified aftercare: Secondary | ICD-10-CM | POA: Diagnosis not present

## 2011-07-14 DIAGNOSIS — Z9861 Coronary angioplasty status: Secondary | ICD-10-CM | POA: Diagnosis not present

## 2011-07-14 DIAGNOSIS — I10 Essential (primary) hypertension: Secondary | ICD-10-CM | POA: Diagnosis not present

## 2011-07-14 DIAGNOSIS — I252 Old myocardial infarction: Secondary | ICD-10-CM | POA: Diagnosis not present

## 2011-07-14 DIAGNOSIS — E785 Hyperlipidemia, unspecified: Secondary | ICD-10-CM | POA: Diagnosis not present

## 2011-07-18 ENCOUNTER — Encounter: Payer: Self-pay | Admitting: Cardiovascular Disease

## 2011-07-18 ENCOUNTER — Ambulatory Visit (INDEPENDENT_AMBULATORY_CARE_PROVIDER_SITE_OTHER): Payer: Medicare Other | Admitting: Cardiovascular Disease

## 2011-07-18 DIAGNOSIS — I5181 Takotsubo syndrome: Secondary | ICD-10-CM | POA: Diagnosis not present

## 2011-07-18 DIAGNOSIS — I2510 Atherosclerotic heart disease of native coronary artery without angina pectoris: Secondary | ICD-10-CM | POA: Diagnosis not present

## 2011-07-18 DIAGNOSIS — E785 Hyperlipidemia, unspecified: Secondary | ICD-10-CM

## 2011-07-18 DIAGNOSIS — I252 Old myocardial infarction: Secondary | ICD-10-CM | POA: Diagnosis not present

## 2011-07-18 DIAGNOSIS — I10 Essential (primary) hypertension: Secondary | ICD-10-CM | POA: Diagnosis not present

## 2011-07-18 DIAGNOSIS — Z9861 Coronary angioplasty status: Secondary | ICD-10-CM | POA: Diagnosis not present

## 2011-07-18 DIAGNOSIS — Z5189 Encounter for other specified aftercare: Secondary | ICD-10-CM | POA: Diagnosis not present

## 2011-07-18 MED ORDER — CLOPIDOGREL BISULFATE 75 MG PO TABS: 75.0000 mg | ORAL_TABLET | Freq: Every day | ORAL | Status: DC

## 2011-07-18 NOTE — Patient Instructions (Signed)
Follow up as scheduled. Stop Brilinta after finishing your current supply. Start Plavix 75 mg daily after finishing Brilinta.

## 2011-07-18 NOTE — Assessment & Plan Note (Signed)
The patient had an admission in December for chest pain with borderline elevated troponin. Cardiac catheterization showed a patent LAD stent. The patient is not able to afford Brilinta. Thus, I will switch her back to Plavix 75 mg once daily. Dual antiplatelet therapy for one year is recommended. That would be in November of 2013. Continue cardiac rehabilitation. Continue other cardiac medications.

## 2011-07-18 NOTE — Assessment & Plan Note (Signed)
Her EF was back to normal on recent cardiac catheterization.

## 2011-07-18 NOTE — Assessment & Plan Note (Signed)
Had lipid profile showed an LDL of 87. This is reasonable. A target of less than 70 his ideas. I will continue simvastatin 40 mg once daily and consider switching to atorvastatin in the future.

## 2011-07-18 NOTE — Progress Notes (Signed)
HPI  This is an 78 year old female who is here today for followup visit. She was hospitalized again in December due to chest pain and mildly elevated troponin. She underwent cardiac catheterization which showed a patent LAD stent without significant restenosis. There was no evidence of other obstructive disease. Her d-dimer was normal. PRU level was high while she was on Plavix. Thus, I decided to switch her to Brilinta. Overall, she has been doing reasonably well. She had very brief episodes of chest pain. She is mostly having problems with her sinuses with significant congestion. This is a chronic problem. She is going to see ENT. The patient was given samples of Brilinta. She is not able to afford the medication. She continues to attend cardiac rehabilitation and has been doing really well.  Allergies  Allergen Reactions  . Iodine Nausea And Vomiting  . Shellfish Allergy     Unsteady gait - No anaphylaxis     Current Outpatient Prescriptions on File Prior to Visit  Medication Sig Dispense Refill  . aspirin EC 81 MG EC tablet Take 1 tablet (81 mg total) by mouth daily.  30 tablet  11  . beclomethasone (BECONASE-AQ) 42 MCG/SPRAY nasal spray Place 1 spray into the nose 2 (two) times daily. Dose is for each nostril.       . isosorbide mononitrate (IMDUR) 30 MG 24 hr tablet Take 1 tablet (30 mg total) by mouth daily.  30 tablet  3  . metoprolol succinate (TOPROL XL) 25 MG 24 hr tablet Take 1 tablet (25 mg total) by mouth daily.  30 tablet  11  . nitroGLYCERIN (NITROSTAT) 0.3 MG SL tablet Place 1 tablet (0.3 mg total) under the tongue every 5 (five) minutes as needed for chest pain.  25 tablet  3  . simvastatin (ZOCOR) 40 MG tablet Take 1 tablet (40 mg total) by mouth at bedtime.  30 tablet  11     Past Medical History  Diagnosis Date  . Hypertension   . Angina   . Sleep apnea   . Blood transfusion   . GERD (gastroesophageal reflux disease)   . Myocardial infarction 05/2011  . Pneumonia     . Anemia   . Heart murmur   . Stress-induced cardiomyopathy 05/2011  . Hyperlipidemia   . Coronary artery disease     LAD PCI in 05/2011 for NSTEMI  . Hypertrophic cardiomyopathy      Past Surgical History  Procedure Date  . Appendectomy   . Partial hysterectomy   . Abdominal hysterectomy   . Wisdom tooth extraction   . Cardiac catheterization 05/2011    80% mid LAD, 40% LCX. EF: 35-40%.   . Coronary angioplasty with stent placement 05/2011    LAD: 2.5 X 16 mm Promus DES  . Cardiac catheterization 06/22/2011    patent LAD stent. Normal EF     Family History  Problem Relation Age of Onset  . Heart attack Mother 70  . Diabetic kidney disease Father 45  . Prostate cancer Father      History   Social History  . Marital Status: Married    Spouse Name: N/A    Number of Children: N/A  . Years of Education: N/A   Occupational History  . Not on file.   Social History Main Topics  . Smoking status: Former Smoker -- 1.0 packs/day for 18 years    Types: Cigarettes    Quit date: 05/11/1979  . Smokeless tobacco: Never Used  . Alcohol Use:  No  . Drug Use: No  . Sexually Active: Yes    Birth Control/ Protection: Post-menopausal   Other Topics Concern  . Not on file   Social History Narrative  . No narrative on file     PHYSICAL EXAM   BP 120/71  Pulse 81  Ht 5\' 5"  (1.651 m)  Wt 127 lb (57.607 kg)  BMI 21.13 kg/m2  SpO2 96%  Constitutional: She is oriented to person, place, and time. She appears well-developed and well-nourished. No distress.  HENT: No nasal discharge.  Head: Normocephalic and atraumatic.  Eyes: Pupils are equal, round, and reactive to light. Right eye exhibits no discharge. Left eye exhibits no discharge.  Neck: Normal range of motion. Neck supple. No JVD present. No thyromegaly present.  Cardiovascular: Normal rate, regular rhythm, normal heart sounds and intact distal pulses. Exam reveals no gallop and no friction rub.  There is a 2/6  systolic ejection murmur at the aortic area. Pulmonary/Chest: Effort normal and breath sounds normal. No stridor. No respiratory distress. She has no wheezes. She has no rales. She exhibits no tenderness.  Abdominal: Soft. Bowel sounds are normal. She exhibits no distension. There is no tenderness. There is no rebound and no guarding.  Musculoskeletal: Normal range of motion. She exhibits no edema and no tenderness.  Neurological: She is alert and oriented to person, place, and time. Coordination normal.  Skin: Skin is warm and dry. No rash noted. She is not diaphoretic. No erythema. No pallor.  Psychiatric: She has a normal mood and affect. Her behavior is normal. Judgment and thought content normal.      ASSESSMENT AND PLAN

## 2011-07-19 DIAGNOSIS — Z5189 Encounter for other specified aftercare: Secondary | ICD-10-CM | POA: Diagnosis not present

## 2011-07-19 DIAGNOSIS — E785 Hyperlipidemia, unspecified: Secondary | ICD-10-CM | POA: Diagnosis not present

## 2011-07-19 DIAGNOSIS — I10 Essential (primary) hypertension: Secondary | ICD-10-CM | POA: Diagnosis not present

## 2011-07-19 DIAGNOSIS — Z9861 Coronary angioplasty status: Secondary | ICD-10-CM | POA: Diagnosis not present

## 2011-07-19 DIAGNOSIS — I252 Old myocardial infarction: Secondary | ICD-10-CM | POA: Diagnosis not present

## 2011-07-19 DIAGNOSIS — I2510 Atherosclerotic heart disease of native coronary artery without angina pectoris: Secondary | ICD-10-CM | POA: Diagnosis not present

## 2011-07-21 DIAGNOSIS — I10 Essential (primary) hypertension: Secondary | ICD-10-CM | POA: Diagnosis not present

## 2011-07-21 DIAGNOSIS — E785 Hyperlipidemia, unspecified: Secondary | ICD-10-CM | POA: Diagnosis not present

## 2011-07-21 DIAGNOSIS — Z5189 Encounter for other specified aftercare: Secondary | ICD-10-CM | POA: Diagnosis not present

## 2011-07-21 DIAGNOSIS — Z9861 Coronary angioplasty status: Secondary | ICD-10-CM | POA: Diagnosis not present

## 2011-07-21 DIAGNOSIS — I2510 Atherosclerotic heart disease of native coronary artery without angina pectoris: Secondary | ICD-10-CM | POA: Diagnosis not present

## 2011-07-21 DIAGNOSIS — I252 Old myocardial infarction: Secondary | ICD-10-CM | POA: Diagnosis not present

## 2011-07-22 ENCOUNTER — Ambulatory Visit: Payer: Medicare Other | Attending: Family Medicine

## 2011-07-22 DIAGNOSIS — R0681 Apnea, not elsewhere classified: Secondary | ICD-10-CM

## 2011-07-22 DIAGNOSIS — G4730 Sleep apnea, unspecified: Secondary | ICD-10-CM | POA: Diagnosis not present

## 2011-07-22 DIAGNOSIS — G4733 Obstructive sleep apnea (adult) (pediatric): Secondary | ICD-10-CM | POA: Diagnosis not present

## 2011-07-22 DIAGNOSIS — G4710 Hypersomnia, unspecified: Secondary | ICD-10-CM | POA: Diagnosis not present

## 2011-07-25 DIAGNOSIS — I2510 Atherosclerotic heart disease of native coronary artery without angina pectoris: Secondary | ICD-10-CM | POA: Diagnosis not present

## 2011-07-25 DIAGNOSIS — Z9861 Coronary angioplasty status: Secondary | ICD-10-CM | POA: Diagnosis not present

## 2011-07-25 DIAGNOSIS — I10 Essential (primary) hypertension: Secondary | ICD-10-CM | POA: Diagnosis not present

## 2011-07-25 DIAGNOSIS — Z5189 Encounter for other specified aftercare: Secondary | ICD-10-CM | POA: Diagnosis not present

## 2011-07-25 DIAGNOSIS — E785 Hyperlipidemia, unspecified: Secondary | ICD-10-CM | POA: Diagnosis not present

## 2011-07-25 DIAGNOSIS — I252 Old myocardial infarction: Secondary | ICD-10-CM | POA: Diagnosis not present

## 2011-07-26 DIAGNOSIS — Z5189 Encounter for other specified aftercare: Secondary | ICD-10-CM | POA: Diagnosis not present

## 2011-07-26 DIAGNOSIS — I252 Old myocardial infarction: Secondary | ICD-10-CM | POA: Diagnosis not present

## 2011-07-26 DIAGNOSIS — E785 Hyperlipidemia, unspecified: Secondary | ICD-10-CM | POA: Diagnosis not present

## 2011-07-26 DIAGNOSIS — I10 Essential (primary) hypertension: Secondary | ICD-10-CM | POA: Diagnosis not present

## 2011-07-26 DIAGNOSIS — Z9861 Coronary angioplasty status: Secondary | ICD-10-CM | POA: Diagnosis not present

## 2011-07-26 DIAGNOSIS — I2510 Atherosclerotic heart disease of native coronary artery without angina pectoris: Secondary | ICD-10-CM | POA: Diagnosis not present

## 2011-07-28 DIAGNOSIS — I252 Old myocardial infarction: Secondary | ICD-10-CM | POA: Diagnosis not present

## 2011-07-28 DIAGNOSIS — E785 Hyperlipidemia, unspecified: Secondary | ICD-10-CM | POA: Diagnosis not present

## 2011-07-28 DIAGNOSIS — Z5189 Encounter for other specified aftercare: Secondary | ICD-10-CM | POA: Diagnosis not present

## 2011-07-28 DIAGNOSIS — I2510 Atherosclerotic heart disease of native coronary artery without angina pectoris: Secondary | ICD-10-CM | POA: Diagnosis not present

## 2011-07-28 DIAGNOSIS — I10 Essential (primary) hypertension: Secondary | ICD-10-CM | POA: Diagnosis not present

## 2011-07-28 DIAGNOSIS — Z9861 Coronary angioplasty status: Secondary | ICD-10-CM | POA: Diagnosis not present

## 2011-08-01 DIAGNOSIS — I2510 Atherosclerotic heart disease of native coronary artery without angina pectoris: Secondary | ICD-10-CM | POA: Diagnosis not present

## 2011-08-01 DIAGNOSIS — I10 Essential (primary) hypertension: Secondary | ICD-10-CM | POA: Diagnosis not present

## 2011-08-01 DIAGNOSIS — E785 Hyperlipidemia, unspecified: Secondary | ICD-10-CM | POA: Diagnosis not present

## 2011-08-01 DIAGNOSIS — Z9861 Coronary angioplasty status: Secondary | ICD-10-CM | POA: Diagnosis not present

## 2011-08-01 DIAGNOSIS — I252 Old myocardial infarction: Secondary | ICD-10-CM | POA: Diagnosis not present

## 2011-08-01 DIAGNOSIS — Z5189 Encounter for other specified aftercare: Secondary | ICD-10-CM | POA: Diagnosis not present

## 2011-08-02 DIAGNOSIS — Z5189 Encounter for other specified aftercare: Secondary | ICD-10-CM | POA: Diagnosis not present

## 2011-08-02 DIAGNOSIS — I2510 Atherosclerotic heart disease of native coronary artery without angina pectoris: Secondary | ICD-10-CM | POA: Diagnosis not present

## 2011-08-02 DIAGNOSIS — I252 Old myocardial infarction: Secondary | ICD-10-CM | POA: Diagnosis not present

## 2011-08-02 DIAGNOSIS — Z9861 Coronary angioplasty status: Secondary | ICD-10-CM | POA: Diagnosis not present

## 2011-08-02 DIAGNOSIS — E785 Hyperlipidemia, unspecified: Secondary | ICD-10-CM | POA: Diagnosis not present

## 2011-08-02 DIAGNOSIS — I10 Essential (primary) hypertension: Secondary | ICD-10-CM | POA: Diagnosis not present

## 2011-08-02 NOTE — Procedures (Signed)
NAME:  Victoria Short, Victoria Short              ACCOUNT NO.:  [id number]  MEDICAL RECORD NO.:  [id number]          PATIENT TYPE:  OUT  LOCATION:  SLEEP LAB                     FACILITY:  [facility]  PHYSICIAN:  Abanoub Hanken A. Gerilyn Pilgrim, M.D. DATE OF BIRTH:  06/02/1925                            NOCTURNAL POLYSOMNOGRAM   INDICATIONS:  An 78 year old who presents with snoring, fatigue, and hypersomnia.  MEDICATIONS:  Aspirin, simvastatin, metoprolol, isosorbide mononitrate.  EPWORTH SLEEPINESS SCALE:  12.  ARCHITECTURAL SUMMARY:  The total recording time is 485 minutes.  Sleep efficiency is 82%, sleep latency 7.5 minutes, REM latency 36 minutes. Stage N1 12%, N2 75%, N3 0%, and REM sleep 13%.  RESPIRATORY SUMMARY:  The baseline saturation is 94, lowest saturation is 77.  Diagnostic AHI 15, RDI 25.  More of the events occurred during the REM sleep.  The REM AHI is 33.  LIMB MOVEMENT SUMMARY:  PLM index 44.  ELECTROCARDIOGRAM SUMMARY:  Average heart rate is 70.  IMPRESSION: 1. Moderate obstructive sleep apnea syndrome, worse during the REM     sleep. 2. Severe periodic limb movement disorder sleep.  RECOMMENDATION:  Formal CPAP titration study.  Thanks for this referral.    Herberth Deharo A. Gerilyn Pilgrim, M.D.    KAD/MEDQ  D:  07/29/2011 16:12:01  T:  07/30/2011 03:41:26  Job:  [id number]

## 2011-08-04 DIAGNOSIS — I252 Old myocardial infarction: Secondary | ICD-10-CM | POA: Diagnosis not present

## 2011-08-04 DIAGNOSIS — Z9861 Coronary angioplasty status: Secondary | ICD-10-CM | POA: Diagnosis not present

## 2011-08-04 DIAGNOSIS — I10 Essential (primary) hypertension: Secondary | ICD-10-CM | POA: Diagnosis not present

## 2011-08-04 DIAGNOSIS — E785 Hyperlipidemia, unspecified: Secondary | ICD-10-CM | POA: Diagnosis not present

## 2011-08-04 DIAGNOSIS — I2510 Atherosclerotic heart disease of native coronary artery without angina pectoris: Secondary | ICD-10-CM | POA: Diagnosis not present

## 2011-08-04 DIAGNOSIS — Z5189 Encounter for other specified aftercare: Secondary | ICD-10-CM | POA: Diagnosis not present

## 2011-08-05 DIAGNOSIS — J330 Polyp of nasal cavity: Secondary | ICD-10-CM | POA: Diagnosis not present

## 2011-08-08 DIAGNOSIS — I2510 Atherosclerotic heart disease of native coronary artery without angina pectoris: Secondary | ICD-10-CM | POA: Diagnosis not present

## 2011-08-08 DIAGNOSIS — I252 Old myocardial infarction: Secondary | ICD-10-CM | POA: Diagnosis not present

## 2011-08-08 DIAGNOSIS — E785 Hyperlipidemia, unspecified: Secondary | ICD-10-CM | POA: Diagnosis not present

## 2011-08-08 DIAGNOSIS — I10 Essential (primary) hypertension: Secondary | ICD-10-CM | POA: Diagnosis not present

## 2011-08-08 DIAGNOSIS — Z5189 Encounter for other specified aftercare: Secondary | ICD-10-CM | POA: Diagnosis not present

## 2011-08-08 DIAGNOSIS — Z9861 Coronary angioplasty status: Secondary | ICD-10-CM | POA: Diagnosis not present

## 2011-08-09 DIAGNOSIS — Z9861 Coronary angioplasty status: Secondary | ICD-10-CM | POA: Diagnosis not present

## 2011-08-09 DIAGNOSIS — Z5189 Encounter for other specified aftercare: Secondary | ICD-10-CM | POA: Diagnosis not present

## 2011-08-09 DIAGNOSIS — I252 Old myocardial infarction: Secondary | ICD-10-CM | POA: Diagnosis not present

## 2011-08-09 DIAGNOSIS — E785 Hyperlipidemia, unspecified: Secondary | ICD-10-CM | POA: Diagnosis not present

## 2011-08-09 DIAGNOSIS — I10 Essential (primary) hypertension: Secondary | ICD-10-CM | POA: Diagnosis not present

## 2011-08-09 DIAGNOSIS — I2510 Atherosclerotic heart disease of native coronary artery without angina pectoris: Secondary | ICD-10-CM | POA: Diagnosis not present

## 2011-08-15 DIAGNOSIS — I2510 Atherosclerotic heart disease of native coronary artery without angina pectoris: Secondary | ICD-10-CM | POA: Diagnosis not present

## 2011-08-15 DIAGNOSIS — Z5189 Encounter for other specified aftercare: Secondary | ICD-10-CM | POA: Diagnosis not present

## 2011-08-15 DIAGNOSIS — I252 Old myocardial infarction: Secondary | ICD-10-CM | POA: Diagnosis not present

## 2011-08-15 DIAGNOSIS — I10 Essential (primary) hypertension: Secondary | ICD-10-CM | POA: Diagnosis not present

## 2011-08-15 DIAGNOSIS — E785 Hyperlipidemia, unspecified: Secondary | ICD-10-CM | POA: Diagnosis not present

## 2011-08-15 DIAGNOSIS — Z9861 Coronary angioplasty status: Secondary | ICD-10-CM | POA: Diagnosis not present

## 2011-08-16 DIAGNOSIS — E785 Hyperlipidemia, unspecified: Secondary | ICD-10-CM | POA: Diagnosis not present

## 2011-08-16 DIAGNOSIS — I10 Essential (primary) hypertension: Secondary | ICD-10-CM | POA: Diagnosis not present

## 2011-08-16 DIAGNOSIS — Z9861 Coronary angioplasty status: Secondary | ICD-10-CM | POA: Diagnosis not present

## 2011-08-16 DIAGNOSIS — I2510 Atherosclerotic heart disease of native coronary artery without angina pectoris: Secondary | ICD-10-CM | POA: Diagnosis not present

## 2011-08-16 DIAGNOSIS — Z5189 Encounter for other specified aftercare: Secondary | ICD-10-CM | POA: Diagnosis not present

## 2011-08-16 DIAGNOSIS — I252 Old myocardial infarction: Secondary | ICD-10-CM | POA: Diagnosis not present

## 2011-08-18 DIAGNOSIS — I2510 Atherosclerotic heart disease of native coronary artery without angina pectoris: Secondary | ICD-10-CM | POA: Diagnosis not present

## 2011-08-18 DIAGNOSIS — I10 Essential (primary) hypertension: Secondary | ICD-10-CM | POA: Diagnosis not present

## 2011-08-18 DIAGNOSIS — Z9861 Coronary angioplasty status: Secondary | ICD-10-CM | POA: Diagnosis not present

## 2011-08-18 DIAGNOSIS — E785 Hyperlipidemia, unspecified: Secondary | ICD-10-CM | POA: Diagnosis not present

## 2011-08-18 DIAGNOSIS — Z5189 Encounter for other specified aftercare: Secondary | ICD-10-CM | POA: Diagnosis not present

## 2011-08-18 DIAGNOSIS — I252 Old myocardial infarction: Secondary | ICD-10-CM | POA: Diagnosis not present

## 2011-08-22 DIAGNOSIS — J342 Deviated nasal septum: Secondary | ICD-10-CM | POA: Diagnosis not present

## 2011-08-22 DIAGNOSIS — J330 Polyp of nasal cavity: Secondary | ICD-10-CM | POA: Diagnosis not present

## 2011-08-22 DIAGNOSIS — J310 Chronic rhinitis: Secondary | ICD-10-CM | POA: Diagnosis not present

## 2011-08-24 DIAGNOSIS — E782 Mixed hyperlipidemia: Secondary | ICD-10-CM | POA: Diagnosis not present

## 2011-08-24 DIAGNOSIS — I10 Essential (primary) hypertension: Secondary | ICD-10-CM | POA: Diagnosis not present

## 2011-08-24 DIAGNOSIS — R7301 Impaired fasting glucose: Secondary | ICD-10-CM | POA: Diagnosis not present

## 2011-08-30 ENCOUNTER — Ambulatory Visit (INDEPENDENT_AMBULATORY_CARE_PROVIDER_SITE_OTHER): Payer: Medicare Other | Admitting: Cardiovascular Disease

## 2011-08-30 ENCOUNTER — Encounter: Payer: Self-pay | Admitting: Cardiovascular Disease

## 2011-08-30 DIAGNOSIS — I5181 Takotsubo syndrome: Secondary | ICD-10-CM | POA: Diagnosis not present

## 2011-08-30 DIAGNOSIS — I10 Essential (primary) hypertension: Secondary | ICD-10-CM | POA: Diagnosis not present

## 2011-08-30 DIAGNOSIS — E785 Hyperlipidemia, unspecified: Secondary | ICD-10-CM

## 2011-08-30 DIAGNOSIS — I2510 Atherosclerotic heart disease of native coronary artery without angina pectoris: Secondary | ICD-10-CM | POA: Diagnosis not present

## 2011-08-30 MED ORDER — CLOPIDOGREL BISULFATE 75 MG PO TABS: 75.0000 mg | ORAL_TABLET | Freq: Every day | ORAL | Status: DC

## 2011-08-30 NOTE — Assessment & Plan Note (Addendum)
The patient had evidence of stress-induced cardiomyopathy which has resolved. Her ejection fraction went back to normal.

## 2011-08-30 NOTE — Progress Notes (Signed)
HPI  This is an 78 year old female who is here today for a followup visit. She has known history of coronary artery disease status post angioplasty and drug-eluting stent placement to the LAD in November of 2012. She also has history of simultaneous stress-induced cardiomyopathy. Her ejection fraction subsequently normalized. Overall, she has been doing reasonably well. She has not had any recurrent chest pain since her last visit. She is attending cardiac rehabilitation. She is now complaining of increased dyspnea. She had low blood pressure yesterday and could not attend rehabilitation. Her systolic blood pressure was 90. She had a sleep study done which was suggestive of sleep apnea. She saw Dr. Andrey Campanile as well for nasal polyps. These are probably contributing to her apnea episodes  Allergies  Allergen Reactions  . Iodine Nausea And Vomiting  . Shellfish Allergy     Unsteady gait - No anaphylaxis     Current Outpatient Prescriptions on File Prior to Visit  Medication Sig Dispense Refill  . aspirin EC 81 MG EC tablet Take 1 tablet (81 mg total) by mouth daily.  30 tablet  11  . metoprolol succinate (TOPROL XL) 25 MG 24 hr tablet Take 1 tablet (25 mg total) by mouth daily.  30 tablet  11  . nitroGLYCERIN (NITROSTAT) 0.3 MG SL tablet Place 1 tablet (0.3 mg total) under the tongue every 5 (five) minutes as needed for chest pain.  25 tablet  3  . simvastatin (ZOCOR) 40 MG tablet Take 1 tablet (40 mg total) by mouth at bedtime.  30 tablet  11     Past Medical History  Diagnosis Date  . Hypertension   . Angina   . Sleep apnea   . Blood transfusion   . GERD (gastroesophageal reflux disease)   . Myocardial infarction 05/2011  . Pneumonia   . Anemia   . Heart murmur   . Stress-induced cardiomyopathy 05/2011  . Hyperlipidemia   . Coronary artery disease     LAD PCI in 05/2011 for NSTEMI  . Hypertrophic cardiomyopathy      Past Surgical History  Procedure Date  . Appendectomy   .  Partial hysterectomy   . Abdominal hysterectomy   . Wisdom tooth extraction   . Cardiac catheterization 05/2011    80% mid LAD, 40% LCX. EF: 35-40%.   . Coronary angioplasty with stent placement 05/2011    LAD: 2.5 X 16 mm Promus DES  . Cardiac catheterization 06/22/2011    patent LAD stent. Normal EF     Family History  Problem Relation Age of Onset  . Heart attack Mother 28  . Diabetic kidney disease Father 24  . Prostate cancer Father      History   Social History  . Marital Status: Married    Spouse Name: N/A    Number of Children: N/A  . Years of Education: N/A   Occupational History  . Not on file.   Social History Main Topics  . Smoking status: Former Smoker -- 1.0 packs/day for 18 years    Types: Cigarettes    Quit date: 05/11/1979  . Smokeless tobacco: Never Used  . Alcohol Use: No  . Drug Use: No  . Sexually Active: Yes    Birth Control/ Protection: Post-menopausal   Other Topics Concern  . Not on file   Social History Narrative  . No narrative on file     PHYSICAL EXAM   BP 122/62  Pulse 67  Ht 5\' 6"  (1.676 m)  Wt  124 lb (56.246 kg)  BMI 20.01 kg/m2  Constitutional: She is oriented to person, place, and time. She appears well-developed and well-nourished. No distress.  HENT: No nasal discharge.  Head: Normocephalic and atraumatic.  Eyes: Pupils are equal and round. Right eye exhibits no discharge. Left eye exhibits no discharge.  Neck: Normal range of motion. Neck supple. No JVD present. No thyromegaly present.  Cardiovascular: Normal rate, regular rhythm, normal heart sounds. Exam reveals no gallop and no friction rub. No murmur heard.  Pulmonary/Chest: Effort normal and breath sounds normal. No stridor. No respiratory distress. She has no wheezes. She has no rales. She exhibits no tenderness.  Abdominal: Soft. Bowel sounds are normal. She exhibits no distension. There is no tenderness. There is no rebound and no guarding.    Musculoskeletal: Normal range of motion. She exhibits no edema and no tenderness.  Neurological: She is alert and oriented to person, place, and time. Coordination normal.  Skin: Skin is warm and dry. No rash noted. She is not diaphoretic. No erythema. No pallor.  Psychiatric: She has a normal mood and affect. Her behavior is normal. Judgment and thought content normal.      ASSESSMENT AND PLAN

## 2011-08-30 NOTE — Assessment & Plan Note (Signed)
Had lipid profile showed an LDL of 87. This is reasonable. A target of less than 70 his ideas. I will continue simvastatin 40 mg once daily and consider switching to atorvastatin in the future.    

## 2011-08-30 NOTE — Assessment & Plan Note (Signed)
Her blood pressure has been running low. Thus, I will stop Imdur.

## 2011-08-30 NOTE — Patient Instructions (Signed)
Your physician recommends that you schedule a follow-up appointment in: 6 months. You will receive a reminder letter in the mail about 1-2 months in advance reminding you to call and schedule your appointment. If you don't receive this letter, please contact our office.  Your physician has recommended you make the following change in your medication: STOP BRILINTA ONCE YOU'VE COMPLETED YOUR CURRENT BOTTLE;THEN, START PLAVIX 75 MG DAILY IN PLACE OF YOUR BRILINTA. STOP ISOSORBIDE MONONITRATE.

## 2011-08-30 NOTE — Assessment & Plan Note (Signed)
She has no recurrent symptoms of angina. She is complaining of increased dyspnea over the last 6 weeks. It seems that this started around the same time that she was switched to Brilinta from Plavix. Dyspnea is a common side effect for this medication. She is also having difficult time affording it. Thus, I will stop Brilinta and start her back on Plavix 75 mg daily once she finishes the current bottle.  Continue cardiac rehabilitation. Dual antiplatelet therapy should be continued until November of 2013.

## 2011-08-31 DIAGNOSIS — R0602 Shortness of breath: Secondary | ICD-10-CM | POA: Diagnosis not present

## 2011-08-31 DIAGNOSIS — I10 Essential (primary) hypertension: Secondary | ICD-10-CM | POA: Diagnosis not present

## 2011-08-31 DIAGNOSIS — E782 Mixed hyperlipidemia: Secondary | ICD-10-CM | POA: Diagnosis not present

## 2011-08-31 DIAGNOSIS — D649 Anemia, unspecified: Secondary | ICD-10-CM | POA: Diagnosis not present

## 2011-08-31 DIAGNOSIS — I252 Old myocardial infarction: Secondary | ICD-10-CM | POA: Diagnosis not present

## 2011-08-31 DIAGNOSIS — R7301 Impaired fasting glucose: Secondary | ICD-10-CM | POA: Diagnosis not present

## 2011-08-31 DIAGNOSIS — G4733 Obstructive sleep apnea (adult) (pediatric): Secondary | ICD-10-CM | POA: Diagnosis not present

## 2011-08-31 DIAGNOSIS — M818 Other osteoporosis without current pathological fracture: Secondary | ICD-10-CM | POA: Diagnosis not present

## 2011-09-01 DIAGNOSIS — I10 Essential (primary) hypertension: Secondary | ICD-10-CM | POA: Diagnosis not present

## 2011-09-01 DIAGNOSIS — I2510 Atherosclerotic heart disease of native coronary artery without angina pectoris: Secondary | ICD-10-CM | POA: Diagnosis not present

## 2011-09-01 DIAGNOSIS — E785 Hyperlipidemia, unspecified: Secondary | ICD-10-CM | POA: Diagnosis not present

## 2011-09-01 DIAGNOSIS — Z5189 Encounter for other specified aftercare: Secondary | ICD-10-CM | POA: Diagnosis not present

## 2011-09-01 DIAGNOSIS — Z9861 Coronary angioplasty status: Secondary | ICD-10-CM | POA: Diagnosis not present

## 2011-09-01 DIAGNOSIS — I252 Old myocardial infarction: Secondary | ICD-10-CM | POA: Diagnosis not present

## 2011-09-05 DIAGNOSIS — I10 Essential (primary) hypertension: Secondary | ICD-10-CM | POA: Diagnosis not present

## 2011-09-05 DIAGNOSIS — Z9861 Coronary angioplasty status: Secondary | ICD-10-CM | POA: Diagnosis not present

## 2011-09-05 DIAGNOSIS — E785 Hyperlipidemia, unspecified: Secondary | ICD-10-CM | POA: Diagnosis not present

## 2011-09-05 DIAGNOSIS — I2510 Atherosclerotic heart disease of native coronary artery without angina pectoris: Secondary | ICD-10-CM | POA: Diagnosis not present

## 2011-09-05 DIAGNOSIS — I252 Old myocardial infarction: Secondary | ICD-10-CM | POA: Diagnosis not present

## 2011-09-05 DIAGNOSIS — I209 Angina pectoris, unspecified: Secondary | ICD-10-CM | POA: Diagnosis not present

## 2011-09-05 DIAGNOSIS — Z5189 Encounter for other specified aftercare: Secondary | ICD-10-CM | POA: Diagnosis not present

## 2011-09-15 ENCOUNTER — Ambulatory Visit: Payer: Medicare Other | Admitting: Cardiovascular Disease

## 2011-10-11 DIAGNOSIS — R1084 Generalized abdominal pain: Secondary | ICD-10-CM | POA: Diagnosis not present

## 2011-10-11 DIAGNOSIS — K5900 Constipation, unspecified: Secondary | ICD-10-CM | POA: Diagnosis not present

## 2011-10-21 DIAGNOSIS — R109 Unspecified abdominal pain: Secondary | ICD-10-CM | POA: Diagnosis not present

## 2011-10-21 DIAGNOSIS — N949 Unspecified condition associated with female genital organs and menstrual cycle: Secondary | ICD-10-CM | POA: Diagnosis not present

## 2011-10-21 DIAGNOSIS — R1084 Generalized abdominal pain: Secondary | ICD-10-CM | POA: Diagnosis not present

## 2011-10-26 ENCOUNTER — Telehealth: Payer: Self-pay | Admitting: Cardiovascular Disease

## 2011-10-26 NOTE — Telephone Encounter (Signed)
I saw her 2 months ago so I can clear her.  She is considered at low risk for cardiac complications. However, both Aspirin 81 mg daily and Plavix 75 mg daily will need to be continued. Plavix can be stopped in November, 20013.

## 2011-10-26 NOTE — Telephone Encounter (Signed)
Victoria Short saw Dr. Andrey Campanile (ENT) on 09-22-11 for evaluation of polyps in her nose. She came into the office Asking if we could check with Dr. Kirke Corin to see if she could be cleared for surgery. States that Dr. Andrey Campanile told Her he would have to put her to sleep. Was uncertain if she would need to see another doctor in the office Or if she can be cleared.  Dr. Tawana Scale office #  [phone]. We also need to let Mrs. Plemons know the Decision. Her husband's cell  # [phone] ([email or web address])

## 2011-10-28 ENCOUNTER — Encounter: Payer: Self-pay | Admitting: *Deleted

## 2011-10-28 NOTE — Telephone Encounter (Signed)
Pt's husband, [name], notified regarding clearance. Copy of this note and recent OV note faxed to Dr. Andrey Campanile.

## 2011-11-18 ENCOUNTER — Telehealth: Payer: Self-pay | Admitting: Physician Assistant

## 2011-11-18 DIAGNOSIS — J342 Deviated nasal septum: Secondary | ICD-10-CM | POA: Diagnosis not present

## 2011-11-18 DIAGNOSIS — J330 Polyp of nasal cavity: Secondary | ICD-10-CM | POA: Diagnosis not present

## 2011-11-18 NOTE — Telephone Encounter (Signed)
Spoke with Victoria Short. He will be happy to have Gene see his wife. Appointment confirmed for Wednesday May 22nd, @ 2:00pm

## 2011-11-18 NOTE — Telephone Encounter (Signed)
I will be happy to see her next week

## 2011-11-18 NOTE — Telephone Encounter (Signed)
Received telephone call from Dr. Laurann Montana office today. Dr.Wilson is seeing Victoria Short today. States that she is complaining of shortness of breath and being dizzy. Made an appointment with Dr. Antoine Poche On June 18th but Dr. Andrey Campanile feels that needs attention before this date.

## 2011-11-22 DIAGNOSIS — I10 Essential (primary) hypertension: Secondary | ICD-10-CM | POA: Diagnosis not present

## 2011-11-22 DIAGNOSIS — D649 Anemia, unspecified: Secondary | ICD-10-CM | POA: Diagnosis not present

## 2011-11-22 DIAGNOSIS — E782 Mixed hyperlipidemia: Secondary | ICD-10-CM | POA: Diagnosis not present

## 2011-11-22 DIAGNOSIS — R7301 Impaired fasting glucose: Secondary | ICD-10-CM | POA: Diagnosis not present

## 2011-11-23 ENCOUNTER — Ambulatory Visit (INDEPENDENT_AMBULATORY_CARE_PROVIDER_SITE_OTHER): Payer: Medicare Other | Admitting: Physician Assistant

## 2011-11-23 ENCOUNTER — Encounter: Payer: Self-pay | Admitting: Physician Assistant

## 2011-11-23 VITALS — BP 161/65 | HR 85 | Resp 16 | Ht 68.0 in | Wt 139.0 lb

## 2011-11-23 DIAGNOSIS — R0989 Other specified symptoms and signs involving the circulatory and respiratory systems: Secondary | ICD-10-CM | POA: Diagnosis not present

## 2011-11-23 DIAGNOSIS — I5181 Takotsubo syndrome: Secondary | ICD-10-CM | POA: Diagnosis not present

## 2011-11-23 DIAGNOSIS — R42 Dizziness and giddiness: Secondary | ICD-10-CM

## 2011-11-23 DIAGNOSIS — R0602 Shortness of breath: Secondary | ICD-10-CM | POA: Diagnosis not present

## 2011-11-23 DIAGNOSIS — I2510 Atherosclerotic heart disease of native coronary artery without angina pectoris: Secondary | ICD-10-CM

## 2011-11-23 NOTE — Assessment & Plan Note (Signed)
We'll order carotid Dopplers to rule out significant ICA stenosis. This may also help explain possible etiology for her persistent, postural dizziness. No significant orthostatic change in SBP was noted, although the patient did complain of dizziness both with sitting and standing. This may be related to VBI, although vertigo is more likely.

## 2011-11-23 NOTE — Patient Instructions (Signed)
   Carotid Dopplers  Echo If the results of your test are normal or stable, you will receive a letter.  If they are abnormal, the nurse will contact you by phone. Patient may hold Plavix x 5 days prior to surgery (continue Aspirin), then resume ASAP Follow up in  2 months

## 2011-11-23 NOTE — Progress Notes (Signed)
HPI:  Patient is seen as an add-on, per Dr. Staci Righter Wilson's request, for evaluation of SOB and dizziness. She was last seen here in clinic in February, by Dr. Kirke Corin, for scheduled followup. She presented with complaint of increased SOB, associated with substitution of Plavix to Brilinta. Therefore, Dr. Kirke Corin placed her back on Plavix, noting that dyspnea was associated with Brilinta, with recommendation to continue DAPT until November 2013.  She has since been diagnosed with nasal polyps, requiring surgical treatment. This has not yet been scheduled, however. Recommendation is to have her off Plavix for one week, prior to surgery.  Clinically, she continues to report no CP, since undergoing PCI in November, 2012. However, she continues to experience SOB, even at rest. There was some improvement after she was taken off Brilinta. She also complains of persistent LE edema, although her weight has remained essentially unchanged, since last OV. She also has persistent dizziness, particularly upon standing. There is some vertigo-like sensation to this. Orthostatic BPs were obtained today, notable for 17 millimeter drop in SBP from supine to sitting, which then increased 33 points upon standing. No significant change in pulse. Patient reported dizziness both with sitting and upon standing.  EKG in office today, reviewed by me, indicates NSR at 81 bpm, with no acute changes; isolated PAC; normal QTC.  Allergies  Allergen Reactions  . Iodine Nausea And Vomiting  . Shellfish Allergy     Unsteady gait - No anaphylaxis    Current Outpatient Prescriptions  Medication Sig Dispense Refill  . ascorbic acid (VITAMIN C) 1000 MG tablet Take 1,000 mg by mouth daily.      Marland Kitchen aspirin EC 81 MG EC tablet Take 1 tablet (81 mg total) by mouth daily.  30 tablet  11  . clopidogrel (PLAVIX) 75 MG tablet Take 1 tablet (75 mg total) by mouth daily. *start once brilinta is finished*  30 tablet  6  . Docusate Sodium (PHILLIPS  STOOL SOFTENER PO) Take by mouth.      . Loratadine (CLARITIN PO) Take by mouth.      . metoprolol succinate (TOPROL XL) 25 MG 24 hr tablet Take 1 tablet (25 mg total) by mouth daily.  30 tablet  11  . Multiple Vitamins-Minerals (MULTIVITAMIN PO) Take by mouth.      . nitroGLYCERIN (NITROSTAT) 0.3 MG SL tablet Place 1 tablet (0.3 mg total) under the tongue every 5 (five) minutes as needed for chest pain.  25 tablet  3  . Polyethylene Glycol 3350 (MIRALAX PO) Take by mouth.      . predniSONE (DELTASONE) 10 MG tablet Take 10 mg by mouth as directed.      . simvastatin (ZOCOR) 40 MG tablet Take 1 tablet (40 mg total) by mouth at bedtime.  30 tablet  11  . vitamin B-12 (CYANOCOBALAMIN) 1000 MCG tablet Take 1,000 mcg by mouth daily.      . vitamin E 1000 UNIT capsule Take 1,000 Units by mouth daily.      . Zinc 50 MG TABS Take by mouth.        Past Medical History  Diagnosis Date  . Hypertension   . Angina   . Sleep apnea   . Blood transfusion   . GERD (gastroesophageal reflux disease)   . Myocardial infarction 05/2011  . Pneumonia   . Anemia   . Heart murmur   . Stress-induced cardiomyopathy 05/2011  . Hyperlipidemia   . Coronary artery disease     LAD PCI in  05/2011 for NSTEMI  . Hypertrophic cardiomyopathy     Past Surgical History  Procedure Date  . Appendectomy   . Partial hysterectomy   . Abdominal hysterectomy   . Wisdom tooth extraction   . Cardiac catheterization 05/2011    80% mid LAD, 40% LCX. EF: 35-40%.   . Coronary angioplasty with stent placement 05/2011    LAD: 2.5 X 16 mm Promus DES  . Cardiac catheterization 06/22/2011    patent LAD stent. Normal EF    History   Social History  . Marital Status: Married    Spouse Name: N/A    Number of Children: N/A  . Years of Education: N/A   Occupational History  . Not on file.   Social History Main Topics  . Smoking status: Former Smoker -- 1.0 packs/day for 18 years    Types: Cigarettes    Quit date:  05/11/1979  . Smokeless tobacco: Never Used  . Alcohol Use: No  . Drug Use: No  . Sexually Active: Yes    Birth Control/ Protection: Post-menopausal   Other Topics Concern  . Not on file   Social History Narrative  . No narrative on file   Social History Narrative  . No narrative on file    Problem Relation Age of Onset  . Heart attack Mother 30  . Diabetic kidney disease Father 63  . Prostate cancer Father     ROS: no nausea, vomiting; no fever, chills; no melena, hematochezia; no claudication  PHYSICAL EXAM: BP 161/65  Pulse 85  Resp 16  Ht 5\' 8"  (1.727 m)  Wt 139 lb (63.05 kg)  BMI 21.13 kg/m2 GENERAL: 78 year old female sitting upright; NAD HEENT: NCAT, PERRLA, EOMI; sclera clear; no xanthelasma NECK: palpable bilateral carotid pulses, bilateral supraclavicular bruits, worse on right; no JVD; no TM LUNGS: CTA bilaterally CARDIAC: RRR (S1, S2); 2-3/6 systolic ejection murmur at base; no rubs or gallops ABDOMEN: soft, non-tender; intact BS EXTREMETIES: intact distal pulses; 1+ bilateral peripheral edema SKIN: warm/dry; no obvious rash/lesions MUSCULOSKELETAL: no joint deformity NEURO: no focal deficit; NL affect   EKG: reviewed and available in Electronic Records   ASSESSMENT & PLAN:  Stress-induced cardiomyopathy Given the patient's persistent complaint of SOB, we'll reassess LVF with a 2-D echocardiogram. The patient suggests some mild improvement in her dyspnea, since being taken off Brilinta and being placed back on Plavix. However, she has had persistent, mild LE edema. If the 2-D echo shows continued normal LVF, as did her last study, 11/12, then she is cleared to proceed with nasal surgery, as recommended. This study will also serve to reassess for significant valvular disease, with last study suggesting mild mitral regurgitation.  This plan was discussed with, and approved by, Dr. Simona Huh.  Coronary artery disease Ideally, the patient should remain  on concomitant ASA/Plavix therapy until November, 2013. However, she cannot wait that long before having resection of her nasal polyps, which apparently are quite symptomatic. Therefore, patient can proceed with surgery with recommendation to hold Plavix for 5 days, while remaining on low-dose aspirin, if at all possible. This is so as to reduce her risk of in-stent thrombosis. She is to then resume Plavix following the procedure, once cleared to do so.  Carotid bruit We'll order carotid Dopplers to rule out significant ICA stenosis. This may also help explain possible etiology for her persistent, postural dizziness. No significant orthostatic change in SBP was noted, although the patient did complain of dizziness both with sitting and standing.  This may be related to VBI, although vertigo is more likely.     Gene Jamiyah Dingley, PAC

## 2011-11-23 NOTE — Assessment & Plan Note (Signed)
Ideally, the patient should remain on concomitant ASA/Plavix therapy until November, 2013. However, she cannot wait that long before having resection of her nasal polyps, which apparently are quite symptomatic. Therefore, patient can proceed with surgery with recommendation to hold Plavix for 5 days, while remaining on low-dose aspirin, if at all possible. This is so as to reduce her risk of in-stent thrombosis. She is to then resume Plavix following the procedure, once cleared to do so.

## 2011-11-23 NOTE — Progress Notes (Signed)
Patient discussed with Mr. Victoria Short. History reviewed. She is being considered for elective nasal polypectomy with Dr. Andrey Campanile. She is less than one year out from previous intervention with DES to the LAD in November 2012, on DAPT. Dyspnea on exertion seems to be relatively stable, last assessment of LV function showed improvement last November.  Plan is to reassess LV function by echocardiogram to ensure stability in light of pending surgery. Ideally, she should remain on uninterrupted DAPT for one year after her intervention. If it is felt that her surgery cannot wait, she is at least 6 months out from this intervention, and one could consider temporarily holding Plavix for 5 days, doing the surgery on aspirin. She would still have some increased risk of in-stent thrombosis with this approach, however not so high as to preclude surgery if felt to be necessary.  Jonelle Sidle, M.D., F.A.C.C.

## 2011-11-23 NOTE — Assessment & Plan Note (Addendum)
Given the patient's persistent complaint of SOB, we'll reassess LVF with a 2-D echocardiogram. The patient suggests some mild improvement in her dyspnea, since being taken off Brilinta and being placed back on Plavix. However, she has had persistent, mild LE edema. If the 2-D echo shows continued normal LVF, as did her last study, 11/12, then she is cleared to proceed with nasal surgery, as recommended. This study will also serve to reassess for significant valvular disease, with last study suggesting mild mitral regurgitation.  This plan was discussed with, and approved by, Dr. Simona Huh.

## 2011-12-02 DIAGNOSIS — E782 Mixed hyperlipidemia: Secondary | ICD-10-CM | POA: Diagnosis not present

## 2011-12-02 DIAGNOSIS — I252 Old myocardial infarction: Secondary | ICD-10-CM | POA: Diagnosis not present

## 2011-12-02 DIAGNOSIS — R0602 Shortness of breath: Secondary | ICD-10-CM | POA: Diagnosis not present

## 2011-12-02 DIAGNOSIS — G4733 Obstructive sleep apnea (adult) (pediatric): Secondary | ICD-10-CM | POA: Diagnosis not present

## 2011-12-02 DIAGNOSIS — R7301 Impaired fasting glucose: Secondary | ICD-10-CM | POA: Diagnosis not present

## 2011-12-02 DIAGNOSIS — I10 Essential (primary) hypertension: Secondary | ICD-10-CM | POA: Diagnosis not present

## 2011-12-02 DIAGNOSIS — M818 Other osteoporosis without current pathological fracture: Secondary | ICD-10-CM | POA: Diagnosis not present

## 2011-12-02 DIAGNOSIS — D649 Anemia, unspecified: Secondary | ICD-10-CM | POA: Diagnosis not present

## 2011-12-07 ENCOUNTER — Other Ambulatory Visit (INDEPENDENT_AMBULATORY_CARE_PROVIDER_SITE_OTHER): Payer: Medicare Other

## 2011-12-07 ENCOUNTER — Other Ambulatory Visit: Payer: Self-pay

## 2011-12-07 DIAGNOSIS — I2510 Atherosclerotic heart disease of native coronary artery without angina pectoris: Secondary | ICD-10-CM

## 2011-12-07 DIAGNOSIS — R0602 Shortness of breath: Secondary | ICD-10-CM

## 2011-12-07 DIAGNOSIS — I5181 Takotsubo syndrome: Secondary | ICD-10-CM

## 2011-12-08 ENCOUNTER — Encounter (INDEPENDENT_AMBULATORY_CARE_PROVIDER_SITE_OTHER): Payer: Medicare Other

## 2011-12-08 DIAGNOSIS — R42 Dizziness and giddiness: Secondary | ICD-10-CM

## 2011-12-08 DIAGNOSIS — R0989 Other specified symptoms and signs involving the circulatory and respiratory systems: Secondary | ICD-10-CM | POA: Diagnosis not present

## 2011-12-08 DIAGNOSIS — I6529 Occlusion and stenosis of unspecified carotid artery: Secondary | ICD-10-CM | POA: Diagnosis not present

## 2011-12-08 DIAGNOSIS — R55 Syncope and collapse: Secondary | ICD-10-CM

## 2011-12-16 ENCOUNTER — Telehealth: Payer: Self-pay | Admitting: *Deleted

## 2011-12-16 NOTE — Telephone Encounter (Signed)
Message copied by Eustace Moore on Fri Dec 16, 2011  9:31 AM ------      Message from: Rande Brunt      Created: Thu Dec 08, 2011 12:40 PM       NL LVF, mild MR. No futher workup.

## 2011-12-16 NOTE — Telephone Encounter (Signed)
Patient informed. 

## 2011-12-16 NOTE — Telephone Encounter (Signed)
Message copied by Eustace Moore on Fri Dec 16, 2011  9:34 AM ------      Message from: Rande Brunt      Created: Wed Dec 14, 2011 11:34 AM       Mild/mod disease. F/u study in 1 year.

## 2011-12-20 ENCOUNTER — Ambulatory Visit: Payer: Medicare Other | Admitting: Cardiology

## 2011-12-22 DIAGNOSIS — D509 Iron deficiency anemia, unspecified: Secondary | ICD-10-CM | POA: Diagnosis not present

## 2012-01-09 DIAGNOSIS — I10 Essential (primary) hypertension: Secondary | ICD-10-CM | POA: Diagnosis not present

## 2012-01-09 DIAGNOSIS — I252 Old myocardial infarction: Secondary | ICD-10-CM | POA: Diagnosis not present

## 2012-01-09 DIAGNOSIS — D509 Iron deficiency anemia, unspecified: Secondary | ICD-10-CM | POA: Diagnosis not present

## 2012-01-09 DIAGNOSIS — J330 Polyp of nasal cavity: Secondary | ICD-10-CM | POA: Diagnosis not present

## 2012-01-09 DIAGNOSIS — J310 Chronic rhinitis: Secondary | ICD-10-CM | POA: Diagnosis not present

## 2012-01-09 DIAGNOSIS — E782 Mixed hyperlipidemia: Secondary | ICD-10-CM | POA: Diagnosis not present

## 2012-01-09 DIAGNOSIS — R7301 Impaired fasting glucose: Secondary | ICD-10-CM | POA: Diagnosis not present

## 2012-01-09 DIAGNOSIS — M818 Other osteoporosis without current pathological fracture: Secondary | ICD-10-CM | POA: Diagnosis not present

## 2012-01-09 DIAGNOSIS — G4730 Sleep apnea, unspecified: Secondary | ICD-10-CM | POA: Diagnosis not present

## 2012-01-17 DIAGNOSIS — K219 Gastro-esophageal reflux disease without esophagitis: Secondary | ICD-10-CM | POA: Diagnosis not present

## 2012-01-17 DIAGNOSIS — K2990 Gastroduodenitis, unspecified, without bleeding: Secondary | ICD-10-CM | POA: Diagnosis not present

## 2012-01-17 DIAGNOSIS — I10 Essential (primary) hypertension: Secondary | ICD-10-CM | POA: Diagnosis not present

## 2012-01-17 DIAGNOSIS — K449 Diaphragmatic hernia without obstruction or gangrene: Secondary | ICD-10-CM | POA: Diagnosis not present

## 2012-01-17 DIAGNOSIS — Z79899 Other long term (current) drug therapy: Secondary | ICD-10-CM | POA: Diagnosis not present

## 2012-01-17 DIAGNOSIS — Z880 Allergy status to penicillin: Secondary | ICD-10-CM | POA: Diagnosis not present

## 2012-01-17 DIAGNOSIS — D649 Anemia, unspecified: Secondary | ICD-10-CM | POA: Diagnosis not present

## 2012-01-17 DIAGNOSIS — K5730 Diverticulosis of large intestine without perforation or abscess without bleeding: Secondary | ICD-10-CM | POA: Diagnosis not present

## 2012-01-17 DIAGNOSIS — E785 Hyperlipidemia, unspecified: Secondary | ICD-10-CM | POA: Diagnosis not present

## 2012-01-17 DIAGNOSIS — K648 Other hemorrhoids: Secondary | ICD-10-CM | POA: Diagnosis not present

## 2012-01-17 DIAGNOSIS — K644 Residual hemorrhoidal skin tags: Secondary | ICD-10-CM | POA: Diagnosis not present

## 2012-01-17 DIAGNOSIS — Z7982 Long term (current) use of aspirin: Secondary | ICD-10-CM | POA: Diagnosis not present

## 2012-01-17 DIAGNOSIS — Z7902 Long term (current) use of antithrombotics/antiplatelets: Secondary | ICD-10-CM | POA: Diagnosis not present

## 2012-01-17 DIAGNOSIS — D509 Iron deficiency anemia, unspecified: Secondary | ICD-10-CM | POA: Diagnosis not present

## 2012-01-17 DIAGNOSIS — K2970 Gastritis, unspecified, without bleeding: Secondary | ICD-10-CM | POA: Diagnosis not present

## 2012-01-17 DIAGNOSIS — Z86010 Personal history of colonic polyps: Secondary | ICD-10-CM | POA: Diagnosis not present

## 2012-01-17 DIAGNOSIS — D126 Benign neoplasm of colon, unspecified: Secondary | ICD-10-CM | POA: Diagnosis not present

## 2012-01-17 DIAGNOSIS — Z9861 Coronary angioplasty status: Secondary | ICD-10-CM | POA: Diagnosis not present

## 2012-01-17 DIAGNOSIS — I2510 Atherosclerotic heart disease of native coronary artery without angina pectoris: Secondary | ICD-10-CM | POA: Diagnosis not present

## 2012-01-17 DIAGNOSIS — Z9109 Other allergy status, other than to drugs and biological substances: Secondary | ICD-10-CM | POA: Diagnosis not present

## 2012-01-17 DIAGNOSIS — I252 Old myocardial infarction: Secondary | ICD-10-CM | POA: Diagnosis not present

## 2012-01-23 DIAGNOSIS — D649 Anemia, unspecified: Secondary | ICD-10-CM | POA: Diagnosis not present

## 2012-01-26 DIAGNOSIS — D509 Iron deficiency anemia, unspecified: Secondary | ICD-10-CM | POA: Diagnosis not present

## 2012-01-30 ENCOUNTER — Ambulatory Visit: Payer: Medicare Other | Admitting: Cardiology

## 2012-02-02 DIAGNOSIS — D509 Iron deficiency anemia, unspecified: Secondary | ICD-10-CM | POA: Diagnosis not present

## 2012-02-03 DIAGNOSIS — D509 Iron deficiency anemia, unspecified: Secondary | ICD-10-CM | POA: Diagnosis not present

## 2012-02-06 DIAGNOSIS — J330 Polyp of nasal cavity: Secondary | ICD-10-CM | POA: Diagnosis not present

## 2012-02-06 DIAGNOSIS — J342 Deviated nasal septum: Secondary | ICD-10-CM | POA: Diagnosis not present

## 2012-02-10 ENCOUNTER — Ambulatory Visit (INDEPENDENT_AMBULATORY_CARE_PROVIDER_SITE_OTHER): Payer: Medicare Other | Admitting: Cardiovascular Disease

## 2012-02-10 ENCOUNTER — Encounter: Payer: Self-pay | Admitting: Cardiovascular Disease

## 2012-02-10 VITALS — BP 142/76 | HR 66 | Ht 65.0 in | Wt 128.1 lb

## 2012-02-10 DIAGNOSIS — I10 Essential (primary) hypertension: Secondary | ICD-10-CM | POA: Diagnosis not present

## 2012-02-10 DIAGNOSIS — I214 Non-ST elevation (NSTEMI) myocardial infarction: Secondary | ICD-10-CM | POA: Diagnosis not present

## 2012-02-10 DIAGNOSIS — R0989 Other specified symptoms and signs involving the circulatory and respiratory systems: Secondary | ICD-10-CM | POA: Diagnosis not present

## 2012-02-10 NOTE — Assessment & Plan Note (Signed)
Stable with no angina and good activity level.  Continue medical Rx Ok to stop Plavix for ENT surgery

## 2012-02-10 NOTE — Progress Notes (Signed)
Patient ID: Victoria Short, female   DOB: 11/03/1924, 78 y.o.   MRN: [id number] Patient of Dr Andee Lineman and Kirke Corin . She has known history of coronary artery disease status post angioplasty and drug-eluting stent placement to the LAD in November of 2012. She also has history of simultaneous stress-induced cardiomyopathy. Her ejection fraction subsequently normalized.She was last seen here in clinic in 5/13  She has some dyspnea with Brillinta and changed to Plavix.  with recommendation to continue DAPT until November 2013. Mild carotid disease by duplex 6/13    She has since been diagnosed with nasal polyps, requiring surgical treatment. This has not yet been scheduled, .She has already been off her Plavix for 5 days for colonoscopy with no bad effects.  Dyspnea from mouth breathing and really needs her polpys removed as her obstructive symptoms have been limiting for over a year.  ROS: Denies fever, malais, weight loss, blurry vision, decreased visual acuity, cough, sputum, SOB, hemoptysis, pleuritic pain, palpitaitons, heartburn, abdominal pain, melena, lower extremity edema, claudication, or rash.  All other systems reviewed and negative  General: Affect appropriate Healthy:  appears stated age HEENT: normal obstructed nares with polyps Neck supple with no adenopathy JVP normal no bruits no thyromegaly Lungs clear with no wheezing and good diaphragmatic motion Heart:  S1/S2 no murmur, no rub, gallop or click PMI normal Abdomen: benighn, BS positve, no tenderness, no AAA no bruit.  No HSM or HJR Distal pulses intact with no bruits No edema Neuro non-focal Skin warm and dry No muscular weakness   Current Outpatient Prescriptions  Medication Sig Dispense Refill  . ascorbic acid (VITAMIN C) 1000 MG tablet Take 1,000 mg by mouth daily.      Marland Kitchen aspirin EC 81 MG EC tablet Take 1 tablet (81 mg total) by mouth daily.  30 tablet  11  . clopidogrel (PLAVIX) 75 MG tablet Take 1 tablet (75 mg total) by  mouth daily. *start once brilinta is finished*  30 tablet  6  . Docusate Sodium (PHILLIPS STOOL SOFTENER PO) Take by mouth.      . Loratadine (CLARITIN PO) Take by mouth.      . metoprolol succinate (TOPROL XL) 25 MG 24 hr tablet Take 1 tablet (25 mg total) by mouth daily.  30 tablet  11  . Multiple Vitamins-Minerals (MULTIVITAMIN PO) Take by mouth.      . nitroGLYCERIN (NITROSTAT) 0.3 MG SL tablet Place 1 tablet (0.3 mg total) under the tongue every 5 (five) minutes as needed for chest pain.  25 tablet  3  . NON FORMULARY Take 1 tablet by mouth 2 (two) times daily. Vegaetarin Capsules; hair, skin, and nail vitamin.      . Polyethylene Glycol 3350 (MIRALAX PO) Take by mouth.      . simvastatin (ZOCOR) 40 MG tablet Take 1 tablet (40 mg total) by mouth at bedtime.  30 tablet  11  . Sodium Chloride (NASAL MIST IN) Inhale 2 sprays into the lungs as needed.      . vitamin B-12 (CYANOCOBALAMIN) 1000 MCG tablet Take 1,000 mcg by mouth daily.      . vitamin E 1000 UNIT capsule Take 1,000 Units by mouth daily.      . Zinc 50 MG TABS Take by mouth.        Allergies  Iodine and Shellfish allergy  Electrocardiogram:  Assessment and Plan

## 2012-02-10 NOTE — Patient Instructions (Signed)
Your physician recommends that you schedule a follow-up appointment in: 6 months in [place]

## 2012-02-10 NOTE — Assessment & Plan Note (Signed)
Well controlled.  Continue current medications and low sodium Dash type diet.    

## 2012-02-10 NOTE — Assessment & Plan Note (Signed)
F/U duplex 6/14  No bruit heard on exam today and mild disease 6/13  ASA

## 2012-03-29 DIAGNOSIS — Z23 Encounter for immunization: Secondary | ICD-10-CM | POA: Diagnosis not present

## 2012-04-04 DIAGNOSIS — I10 Essential (primary) hypertension: Secondary | ICD-10-CM | POA: Diagnosis not present

## 2012-04-04 DIAGNOSIS — D509 Iron deficiency anemia, unspecified: Secondary | ICD-10-CM | POA: Diagnosis not present

## 2012-04-04 DIAGNOSIS — R7301 Impaired fasting glucose: Secondary | ICD-10-CM | POA: Diagnosis not present

## 2012-04-04 DIAGNOSIS — K5909 Other constipation: Secondary | ICD-10-CM | POA: Diagnosis not present

## 2012-04-04 DIAGNOSIS — E782 Mixed hyperlipidemia: Secondary | ICD-10-CM | POA: Diagnosis not present

## 2012-04-10 DIAGNOSIS — I252 Old myocardial infarction: Secondary | ICD-10-CM | POA: Diagnosis not present

## 2012-04-10 DIAGNOSIS — I2510 Atherosclerotic heart disease of native coronary artery without angina pectoris: Secondary | ICD-10-CM | POA: Diagnosis not present

## 2012-04-10 DIAGNOSIS — Z91041 Radiographic dye allergy status: Secondary | ICD-10-CM | POA: Diagnosis not present

## 2012-04-10 DIAGNOSIS — J330 Polyp of nasal cavity: Secondary | ICD-10-CM | POA: Diagnosis not present

## 2012-04-10 DIAGNOSIS — J339 Nasal polyp, unspecified: Secondary | ICD-10-CM | POA: Diagnosis not present

## 2012-04-10 DIAGNOSIS — Z79899 Other long term (current) drug therapy: Secondary | ICD-10-CM | POA: Diagnosis not present

## 2012-04-10 DIAGNOSIS — I10 Essential (primary) hypertension: Secondary | ICD-10-CM | POA: Diagnosis not present

## 2012-04-10 DIAGNOSIS — K219 Gastro-esophageal reflux disease without esophagitis: Secondary | ICD-10-CM | POA: Diagnosis not present

## 2012-04-10 DIAGNOSIS — Z9109 Other allergy status, other than to drugs and biological substances: Secondary | ICD-10-CM | POA: Diagnosis not present

## 2012-04-10 DIAGNOSIS — Z91013 Allergy to seafood: Secondary | ICD-10-CM | POA: Diagnosis not present

## 2012-04-10 DIAGNOSIS — Z9861 Coronary angioplasty status: Secondary | ICD-10-CM | POA: Diagnosis not present

## 2012-04-11 DIAGNOSIS — I10 Essential (primary) hypertension: Secondary | ICD-10-CM | POA: Diagnosis not present

## 2012-04-11 DIAGNOSIS — R1084 Generalized abdominal pain: Secondary | ICD-10-CM | POA: Diagnosis not present

## 2012-04-11 DIAGNOSIS — R7301 Impaired fasting glucose: Secondary | ICD-10-CM | POA: Diagnosis not present

## 2012-04-11 DIAGNOSIS — E782 Mixed hyperlipidemia: Secondary | ICD-10-CM | POA: Diagnosis not present

## 2012-04-11 DIAGNOSIS — K5909 Other constipation: Secondary | ICD-10-CM | POA: Diagnosis not present

## 2012-04-11 DIAGNOSIS — Z86010 Personal history of colonic polyps: Secondary | ICD-10-CM | POA: Diagnosis not present

## 2012-04-11 DIAGNOSIS — I252 Old myocardial infarction: Secondary | ICD-10-CM | POA: Diagnosis not present

## 2012-04-11 DIAGNOSIS — D509 Iron deficiency anemia, unspecified: Secondary | ICD-10-CM | POA: Diagnosis not present

## 2012-04-24 DIAGNOSIS — J310 Chronic rhinitis: Secondary | ICD-10-CM | POA: Diagnosis not present

## 2012-04-24 DIAGNOSIS — J342 Deviated nasal septum: Secondary | ICD-10-CM | POA: Diagnosis not present

## 2012-05-09 ENCOUNTER — Telehealth: Payer: Self-pay | Admitting: Cardiology

## 2012-05-09 NOTE — Telephone Encounter (Signed)
DOES SHE NEED TO CONTINUE TAKING PLAVIX   SHE HAS 4 PILLS LEFT    [phone] [place]

## 2012-05-10 NOTE — Telephone Encounter (Signed)
Husband [name]) notified of below.

## 2012-05-10 NOTE — Telephone Encounter (Signed)
Yes she can stop Plavix

## 2012-05-10 NOTE — Telephone Encounter (Signed)
Please clarify - is patient okay to stop Plavix this November?  Last OV note does state discontinue dual antiplatelet therapy.  Just want to make sure this is correct & she should continue to take the Aspirin 81mg .

## 2012-06-05 ENCOUNTER — Other Ambulatory Visit (HOSPITAL_COMMUNITY): Payer: Self-pay | Admitting: Physician Assistant

## 2012-06-06 DIAGNOSIS — H26499 Other secondary cataract, unspecified eye: Secondary | ICD-10-CM | POA: Diagnosis not present

## 2012-06-06 DIAGNOSIS — H43399 Other vitreous opacities, unspecified eye: Secondary | ICD-10-CM | POA: Diagnosis not present

## 2012-06-08 ENCOUNTER — Other Ambulatory Visit (HOSPITAL_COMMUNITY): Payer: Self-pay | Admitting: Physician Assistant

## 2012-06-08 NOTE — Telephone Encounter (Signed)
rx sent to pharmacy by e-script  

## 2012-06-12 DIAGNOSIS — I252 Old myocardial infarction: Secondary | ICD-10-CM | POA: Diagnosis not present

## 2012-06-12 DIAGNOSIS — Z888 Allergy status to other drugs, medicaments and biological substances status: Secondary | ICD-10-CM | POA: Diagnosis not present

## 2012-06-12 DIAGNOSIS — Z7982 Long term (current) use of aspirin: Secondary | ICD-10-CM | POA: Diagnosis not present

## 2012-06-12 DIAGNOSIS — Z7902 Long term (current) use of antithrombotics/antiplatelets: Secondary | ICD-10-CM | POA: Diagnosis not present

## 2012-06-12 DIAGNOSIS — Z79899 Other long term (current) drug therapy: Secondary | ICD-10-CM | POA: Diagnosis not present

## 2012-06-12 DIAGNOSIS — I10 Essential (primary) hypertension: Secondary | ICD-10-CM | POA: Diagnosis not present

## 2012-06-12 DIAGNOSIS — R42 Dizziness and giddiness: Secondary | ICD-10-CM | POA: Diagnosis not present

## 2012-06-12 DIAGNOSIS — D649 Anemia, unspecified: Secondary | ICD-10-CM | POA: Diagnosis not present

## 2012-06-12 DIAGNOSIS — D509 Iron deficiency anemia, unspecified: Secondary | ICD-10-CM | POA: Diagnosis not present

## 2012-06-12 DIAGNOSIS — Z91013 Allergy to seafood: Secondary | ICD-10-CM | POA: Diagnosis not present

## 2012-06-12 DIAGNOSIS — J438 Other emphysema: Secondary | ICD-10-CM | POA: Diagnosis not present

## 2012-06-12 DIAGNOSIS — J9819 Other pulmonary collapse: Secondary | ICD-10-CM | POA: Diagnosis not present

## 2012-06-13 DIAGNOSIS — D509 Iron deficiency anemia, unspecified: Secondary | ICD-10-CM | POA: Diagnosis not present

## 2012-06-13 DIAGNOSIS — D649 Anemia, unspecified: Secondary | ICD-10-CM | POA: Diagnosis not present

## 2012-06-13 DIAGNOSIS — R42 Dizziness and giddiness: Secondary | ICD-10-CM | POA: Diagnosis not present

## 2012-06-14 DIAGNOSIS — D649 Anemia, unspecified: Secondary | ICD-10-CM | POA: Diagnosis not present

## 2012-06-15 DIAGNOSIS — D649 Anemia, unspecified: Secondary | ICD-10-CM | POA: Diagnosis not present

## 2012-06-18 DIAGNOSIS — D649 Anemia, unspecified: Secondary | ICD-10-CM | POA: Diagnosis not present

## 2012-07-20 DIAGNOSIS — I10 Essential (primary) hypertension: Secondary | ICD-10-CM | POA: Diagnosis not present

## 2012-07-20 DIAGNOSIS — E782 Mixed hyperlipidemia: Secondary | ICD-10-CM | POA: Diagnosis not present

## 2012-07-20 DIAGNOSIS — D509 Iron deficiency anemia, unspecified: Secondary | ICD-10-CM | POA: Diagnosis not present

## 2012-07-23 ENCOUNTER — Encounter: Payer: Self-pay | Admitting: *Deleted

## 2012-07-23 ENCOUNTER — Telehealth: Payer: Self-pay | Admitting: Cardiovascular Disease

## 2012-07-23 NOTE — Telephone Encounter (Signed)
Patient was sent her by Dr Leandrew Koyanagi to see if he Metroplol could be reduced.  Labs are scanned in from Dr Leandrew Koyanagi  07/20/12.

## 2012-07-24 NOTE — Telephone Encounter (Signed)
Patient notified

## 2012-07-24 NOTE — Telephone Encounter (Signed)
She is on a very small dose. I prefer that she stays on same dose until she is evaluated.

## 2012-07-24 NOTE — Telephone Encounter (Signed)
Discussed below with patient.  Patient states that Dr. Leandrew Koyanagi would like to decrease her Metoprolol.  She is having problems with low iron & has had to have 4 IV Iron infusions recently.  PMD would like to see if this would help.  Advised her that she does have OV scheduled for 2/24 with Dr. Kirke Corin & this may need to be discussed at OV since not seen since August 2013.  Patient verbalized understanding.

## 2012-08-04 HISTORY — PX: CARDIAC CATHETERIZATION: SHX172

## 2012-08-18 DIAGNOSIS — I2510 Atherosclerotic heart disease of native coronary artery without angina pectoris: Secondary | ICD-10-CM | POA: Diagnosis not present

## 2012-08-18 DIAGNOSIS — Z91013 Allergy to seafood: Secondary | ICD-10-CM | POA: Diagnosis not present

## 2012-08-18 DIAGNOSIS — R011 Cardiac murmur, unspecified: Secondary | ICD-10-CM | POA: Diagnosis not present

## 2012-08-18 DIAGNOSIS — Z87891 Personal history of nicotine dependence: Secondary | ICD-10-CM | POA: Diagnosis not present

## 2012-08-18 DIAGNOSIS — Z888 Allergy status to other drugs, medicaments and biological substances status: Secondary | ICD-10-CM | POA: Diagnosis not present

## 2012-08-18 DIAGNOSIS — R079 Chest pain, unspecified: Secondary | ICD-10-CM | POA: Diagnosis not present

## 2012-08-18 DIAGNOSIS — Z8249 Family history of ischemic heart disease and other diseases of the circulatory system: Secondary | ICD-10-CM | POA: Diagnosis not present

## 2012-08-18 DIAGNOSIS — R0602 Shortness of breath: Secondary | ICD-10-CM | POA: Diagnosis not present

## 2012-08-18 DIAGNOSIS — I252 Old myocardial infarction: Secondary | ICD-10-CM | POA: Diagnosis not present

## 2012-08-18 DIAGNOSIS — Z7982 Long term (current) use of aspirin: Secondary | ICD-10-CM | POA: Diagnosis not present

## 2012-08-18 DIAGNOSIS — I10 Essential (primary) hypertension: Secondary | ICD-10-CM | POA: Diagnosis not present

## 2012-08-18 DIAGNOSIS — R072 Precordial pain: Secondary | ICD-10-CM | POA: Diagnosis not present

## 2012-08-18 DIAGNOSIS — D509 Iron deficiency anemia, unspecified: Secondary | ICD-10-CM | POA: Diagnosis not present

## 2012-08-18 DIAGNOSIS — Z79899 Other long term (current) drug therapy: Secondary | ICD-10-CM | POA: Diagnosis not present

## 2012-08-19 DIAGNOSIS — R079 Chest pain, unspecified: Secondary | ICD-10-CM | POA: Diagnosis not present

## 2012-08-20 ENCOUNTER — Observation Stay (HOSPITAL_COMMUNITY)
Admission: AD | Admit: 2012-08-20 | Discharge: 2012-08-21 | Disposition: A | Discharge status: Home / Self Care | Payer: Medicare Other | Source: Other Acute Inpatient Hospital | Attending: Cardiology | Admitting: Cardiology

## 2012-08-20 ENCOUNTER — Other Ambulatory Visit: Payer: Self-pay | Admitting: Physician Assistant

## 2012-08-20 ENCOUNTER — Encounter (HOSPITAL_COMMUNITY)
Admission: AD | Disposition: A | Discharge status: Home / Self Care | Payer: Self-pay | Source: Other Acute Inpatient Hospital | Attending: Cardiology

## 2012-08-20 ENCOUNTER — Encounter (HOSPITAL_COMMUNITY): Payer: Self-pay | Admitting: *Deleted

## 2012-08-20 DIAGNOSIS — E785 Hyperlipidemia, unspecified: Secondary | ICD-10-CM

## 2012-08-20 DIAGNOSIS — G4730 Sleep apnea, unspecified: Secondary | ICD-10-CM | POA: Insufficient documentation

## 2012-08-20 DIAGNOSIS — I10 Essential (primary) hypertension: Secondary | ICD-10-CM | POA: Insufficient documentation

## 2012-08-20 DIAGNOSIS — I2510 Atherosclerotic heart disease of native coronary artery without angina pectoris: Secondary | ICD-10-CM | POA: Diagnosis present

## 2012-08-20 DIAGNOSIS — Z9861 Coronary angioplasty status: Secondary | ICD-10-CM | POA: Diagnosis not present

## 2012-08-20 DIAGNOSIS — I658 Occlusion and stenosis of other precerebral arteries: Secondary | ICD-10-CM | POA: Insufficient documentation

## 2012-08-20 DIAGNOSIS — I6529 Occlusion and stenosis of unspecified carotid artery: Secondary | ICD-10-CM | POA: Diagnosis not present

## 2012-08-20 DIAGNOSIS — I200 Unstable angina: Secondary | ICD-10-CM

## 2012-08-20 DIAGNOSIS — I422 Other hypertrophic cardiomyopathy: Secondary | ICD-10-CM | POA: Diagnosis not present

## 2012-08-20 DIAGNOSIS — R7309 Other abnormal glucose: Secondary | ICD-10-CM | POA: Diagnosis not present

## 2012-08-20 DIAGNOSIS — K219 Gastro-esophageal reflux disease without esophagitis: Secondary | ICD-10-CM | POA: Diagnosis not present

## 2012-08-20 DIAGNOSIS — D509 Iron deficiency anemia, unspecified: Secondary | ICD-10-CM | POA: Insufficient documentation

## 2012-08-20 DIAGNOSIS — I252 Old myocardial infarction: Secondary | ICD-10-CM | POA: Diagnosis not present

## 2012-08-20 DIAGNOSIS — R079 Chest pain, unspecified: Secondary | ICD-10-CM

## 2012-08-20 HISTORY — DX: Peripheral vascular disease, unspecified: I73.9

## 2012-08-20 HISTORY — PX: LEFT HEART CATHETERIZATION WITH CORONARY ANGIOGRAM: SHX5451

## 2012-08-20 HISTORY — DX: Cardiogenic shock: R57.0

## 2012-08-20 HISTORY — DX: Disorder of arteries and arterioles, unspecified: I77.9

## 2012-08-20 HISTORY — DX: Nasal polyp, unspecified: J33.9

## 2012-08-20 SURGERY — LEFT HEART CATHETERIZATION WITH CORONARY ANGIOGRAM
Anesthesia: LOCAL

## 2012-08-20 MED ORDER — HEPARIN (PORCINE) IN NACL 2-0.9 UNIT/ML-% IJ SOLN
INTRAMUSCULAR | Status: AC
  Filled 2012-08-20: qty 1000

## 2012-08-20 MED ORDER — DIPHENHYDRAMINE HCL 50 MG/ML IJ SOLN
INTRAMUSCULAR | Status: AC
  Filled 2012-08-20: qty 1

## 2012-08-20 MED ORDER — NITROGLYCERIN 0.4 MG SL SUBL: 0.4000 mg | SUBLINGUAL_TABLET | SUBLINGUAL | Status: DC | PRN

## 2012-08-20 MED ORDER — METHYLPREDNISOLONE SODIUM SUCC 125 MG IJ SOLR
INTRAMUSCULAR | Status: AC
  Filled 2012-08-20: qty 2

## 2012-08-20 MED ORDER — ASPIRIN EC 81 MG PO TBEC: 81.0000 mg | DELAYED_RELEASE_TABLET | Freq: Every day | ORAL | Status: DC

## 2012-08-20 MED ORDER — ATORVASTATIN CALCIUM 80 MG PO TABS
80.0000 mg | ORAL_TABLET | Freq: Every day | ORAL | Status: DC
  Filled 2012-08-20: qty 1

## 2012-08-20 MED ORDER — METOPROLOL SUCCINATE ER 25 MG PO TB24
25.0000 mg | ORAL_TABLET | Freq: Every day | ORAL | Status: DC
  Filled 2012-08-20: qty 1

## 2012-08-20 MED ORDER — SIMVASTATIN 40 MG PO TABS
40.0000 mg | ORAL_TABLET | Freq: Every day | ORAL | Status: DC
  Administered 2012-08-20: 40 mg via ORAL
  Filled 2012-08-20 (×3): qty 1

## 2012-08-20 MED ORDER — NITROGLYCERIN 0.3 MG SL SUBL
0.3000 mg | SUBLINGUAL_TABLET | SUBLINGUAL | Status: DC | PRN
  Filled 2012-08-20: qty 100

## 2012-08-20 MED ORDER — ACETAMINOPHEN 325 MG PO TABS: 650.0000 mg | ORAL_TABLET | ORAL | Status: DC | PRN

## 2012-08-20 MED ORDER — ONDANSETRON HCL 4 MG/2ML IJ SOLN: 4.0000 mg | Freq: Four times a day (QID) | INTRAMUSCULAR | Status: DC | PRN

## 2012-08-20 MED ORDER — SODIUM CHLORIDE 0.9 % IV SOLN: INTRAVENOUS | Status: DC

## 2012-08-20 MED ORDER — ASPIRIN 81 MG PO CHEW: 324.0000 mg | CHEWABLE_TABLET | ORAL | Status: DC

## 2012-08-20 MED ORDER — CLOPIDOGREL BISULFATE 75 MG PO TABS
75.0000 mg | ORAL_TABLET | Freq: Every day | ORAL | Status: DC
  Administered 2012-08-20 – 2012-08-21 (×2): 75 mg via ORAL
  Filled 2012-08-20 (×2): qty 1

## 2012-08-20 MED ORDER — METOPROLOL SUCCINATE ER 25 MG PO TB24
25.0000 mg | ORAL_TABLET | Freq: Every day | ORAL | Status: DC
  Administered 2012-08-21: 25 mg via ORAL
  Filled 2012-08-20: qty 1

## 2012-08-20 MED ORDER — SODIUM CHLORIDE 0.9 % IJ SOLN: 3.0000 mL | INTRAMUSCULAR | Status: DC | PRN

## 2012-08-20 MED ORDER — HYDRALAZINE HCL 20 MG/ML IJ SOLN: 10.0000 mg | INTRAMUSCULAR | Status: DC | PRN

## 2012-08-20 MED ORDER — SODIUM CHLORIDE 0.9 % IV SOLN: 250.0000 mL | INTRAVENOUS | Status: DC | PRN

## 2012-08-20 MED ORDER — SODIUM CHLORIDE 0.9 % IV SOLN: INTRAVENOUS | Status: AC

## 2012-08-20 MED ORDER — LIDOCAINE HCL (PF) 1 % IJ SOLN
INTRAMUSCULAR | Status: AC
  Filled 2012-08-20: qty 30

## 2012-08-20 MED ORDER — SODIUM CHLORIDE 0.9 % IJ SOLN: 3.0000 mL | Freq: Two times a day (BID) | INTRAMUSCULAR | Status: DC

## 2012-08-20 MED ORDER — PANTOPRAZOLE SODIUM 40 MG PO TBEC: 40.0000 mg | DELAYED_RELEASE_TABLET | Freq: Every day | ORAL | Status: DC

## 2012-08-20 MED ORDER — NITROGLYCERIN 2 % TD OINT
0.5000 [in_us] | TOPICAL_OINTMENT | Freq: Four times a day (QID) | TRANSDERMAL | Status: DC
  Filled 2012-08-20: qty 30

## 2012-08-20 MED ORDER — METOPROLOL SUCCINATE ER 25 MG PO TB24: 25.0000 mg | ORAL_TABLET | Freq: Every day | ORAL | Status: DC

## 2012-08-20 MED ORDER — FAMOTIDINE IN NACL 20-0.9 MG/50ML-% IV SOLN
INTRAVENOUS | Status: AC
  Filled 2012-08-20: qty 50

## 2012-08-20 NOTE — CV Procedure (Signed)
   Cardiac Catheterization Operative Report  Victoria Short [id number] 2/17/20145:51 PM Juliette Alcide, MD  Procedure Performed:  1. Left Heart Catheterization 2. Selective Coronary Angiography 3. Left ventricular angiogram 4. Mynx Femoral Artery Closure Device Right femoral artery  Operator: Verne Carrow, MD  Indication: 79 yo female with history of CAD with stent LAD in 2012 transferred from [facility] for cardiac cath with recent c/o chest pains concerning for unstable angina.                                        Procedure Details: The risks, benefits, complications, treatment options, and expected outcomes were discussed with the patient. The patient and/or family concurred with the proposed plan, giving informed consent. The patient was brought to the cath lab after IV hydration was begun and oral premedication was given. The patient was not sedated. The right groin was prepped and draped in the usual manner. Using the modified Seldinger access technique, a 5 French sheath was placed in the right femoral artery. Standard diagnostic catheters were used to perform selective coronary angiography. A pigtail catheter was used to perform a left ventricular angiogram. Mynx right femoral artery.   There were no immediate complications. The patient was taken to the recovery area in stable condition.   Hemodynamic Findings: Central aortic pressure: 158/60 Left ventricular pressure: 172/2/16  Angiographic Findings:  Left main: Normal in size with mild plaque distally.   Left Anterior Descending Artery: Moderate caliber vessel that courses to the apex. The proximal vessel has diffuse 20% stenosis. There is a patent stent in the mid vessel with no restenosis. Just before the stent, there is a moderate caliber diagonal branch with no disease noted. The distal LAD has mild plaque disease.   Circumflex Artery: Large caliber vessel with moderate sized obtuse marginal branch. There is a  30% mid stenosis in the marginal branch.   Right Coronary Artery: Large caliber, dominant vessel with diffuse 30% stenosis in the mid segment. The PDA and posterolateral segments are moderate in size and free of disease. Mild calcification in the proximal and mid vessel.   Left Ventricular Angiogram: LVEF=55-60%.   Impression: 1. Stable single vessel CAD with patent stent mid LAD 2. Mild non-obstructive disease in the RCA and Circumflex 3. Normal LV systolic function 4. Non-cardiac chest pain  Recommendations: Continue medical management. Monitor overnight. D/C home in am if stable.        Complications:  None. The patient tolerated the procedure well.

## 2012-08-20 NOTE — Interval H&P Note (Signed)
History and Physical Interval Note:  08/20/2012 5:16 PM  Victoria Short  has presented today for cardiac cath  with the diagnosis of Chest pain  The various methods of treatment have been discussed with the patient and family. After consideration of risks, benefits and other options for treatment, the patient has consented to  Procedure(s): LEFT HEART CATHETERIZATION WITH CORONARY ANGIOGRAM (N/A) as a surgical intervention .  The patient's history has been reviewed, patient examined, no change in status, stable for surgery.  I have reviewed the patient's chart and labs.  Questions were answered to the patient's satisfaction.     Willford Rabideau

## 2012-08-20 NOTE — H&P (Signed)
   See full History of physical in the shadow chart and full progress note today from [facility] by Dr. Shirlee Latch. Recurrent chest pain. Negative enzymes. Cardiac cath today.   Victoria Short 5:15 PM 08/20/2012

## 2012-08-20 NOTE — Progress Notes (Signed)
Patient's SBP 170-180's post cardiac cath.  Ward Givens NP paged and notified.  New orders received.  Will continue to monitor.  Colman Cater

## 2012-08-21 ENCOUNTER — Encounter (HOSPITAL_COMMUNITY): Payer: Self-pay | Admitting: Physician Assistant

## 2012-08-21 DIAGNOSIS — I10 Essential (primary) hypertension: Secondary | ICD-10-CM | POA: Diagnosis not present

## 2012-08-21 DIAGNOSIS — I2510 Atherosclerotic heart disease of native coronary artery without angina pectoris: Secondary | ICD-10-CM

## 2012-08-21 DIAGNOSIS — E785 Hyperlipidemia, unspecified: Secondary | ICD-10-CM

## 2012-08-21 DIAGNOSIS — I422 Other hypertrophic cardiomyopathy: Secondary | ICD-10-CM | POA: Diagnosis not present

## 2012-08-21 DIAGNOSIS — I200 Unstable angina: Secondary | ICD-10-CM

## 2012-08-21 DIAGNOSIS — I658 Occlusion and stenosis of other precerebral arteries: Secondary | ICD-10-CM | POA: Diagnosis not present

## 2012-08-21 DIAGNOSIS — I6529 Occlusion and stenosis of unspecified carotid artery: Secondary | ICD-10-CM | POA: Diagnosis not present

## 2012-08-21 DIAGNOSIS — R7309 Other abnormal glucose: Secondary | ICD-10-CM | POA: Diagnosis not present

## 2012-08-21 MED ORDER — NITROGLYCERIN 0.4 MG SL SUBL: 0.4000 mg | SUBLINGUAL_TABLET | SUBLINGUAL | Status: AC | PRN

## 2012-08-21 NOTE — Progress Notes (Addendum)
PROGRESS NOTE  Subjective:   Pt is an 79 yo with hx of CAD ( [place]) , transferred from [facility] for CP.  Cath yesterday afternoon looked fine.  She feels well this am   Objective:    Vital Signs:   Temp:  [98.1 F (36.7 C)-98.3 F (36.8 C)] 98.3 F (36.8 C) (02/18 0435) Pulse Rate:  [59-85] 85 (02/18 0435) Resp:  [18] 18 (02/18 0435) BP: (121-185)/(42-77) 135/75 mmHg (02/18 0435) SpO2:  [92 %] 92 % (02/18 0435) Weight:  [132 lb 11.2 oz (60.192 kg)-135 lb 14.4 oz (61.644 kg)] 132 lb 11.2 oz (60.192 kg) (02/18 0435)  Last BM Date: 08/18/12   24-hour weight change: Weight change:   Weight trends: Filed Weights   08/20/12 1430 08/21/12 0435  Weight: 135 lb 14.4 oz (61.644 kg) 132 lb 11.2 oz (60.192 kg)    Intake/Output:  02/17 0701 - 02/18 0700 In: 50 [I.V.:50] Out: 500 [Urine:500]     Physical Exam: BP 135/75  Pulse 85  Temp(Src) 98.3 F (36.8 C) (Oral)  Resp 18  Ht 5\' 6"  (1.676 m)  Wt 132 lb 11.2 oz (60.192 kg)  BMI 21.43 kg/m2  SpO2 92%  General: Vital signs reviewed and noted.    Head: Normocephalic, atraumatic.  Eyes: conjunctivae/corneas clear.  EOM's intact.   Throat: normal  Neck:  normal  Lungs:    clear  Heart:  RR, soft murmur  Abdomen:  Soft, non-tender, non-distended    Extremities:  cath site looks ok   Neurologic: A&O X3, CN II - XII are grossly intact.   Psych: Normal     Labs: BMET: No results found for this basename: NA, K, CL, CO2, GLUCOSE, BUN, CREATININE, CALCIUM, MG, PHOS,  in the last 72 hours  Liver function tests: No results found for this basename: AST, ALT, ALKPHOS, BILITOT, PROT, ALBUMIN,  in the last 72 hours No results found for this basename: LIPASE, AMYLASE,  in the last 72 hours  CBC: No results found for this basename: WBC, NEUTROABS, HGB, HCT, MCV, PLT,  in the last 72 hours  Cardiac Enzymes: No results found for this basename: CKTOTAL, CKMB, TROPONINI,  in the last 72 hours  Coagulation  Studies: No results found for this basename: LABPROT, INR,  in the last 72 hours  Other: No components found with this basename: POCBNP,  No results found for this basename: DDIMER,  in the last 72 hours No results found for this basename: HGBA1C,  in the last 72 hours No results found for this basename: CHOL, HDL, LDLCALC, TRIG, CHOLHDL,  in the last 72 hours No results found for this basename: TSH, T4TOTAL, FREET3, T3FREE, THYROIDAB,  in the last 72 hours No results found for this basename: VITAMINB12, FOLATE, FERRITIN, TIBC, IRON, RETICCTPCT,  in the last 72 hours   Other results:  NSR  Medications:    Infusions:    Scheduled Medications: . clopidogrel  75 mg Oral Daily  . metoprolol succinate  25 mg Oral Daily  . simvastatin  40 mg Oral q1800    Assessment/ Plan:    1. CAD:  Stent looks OK.  Will DC today on home medications + ASA.  She was started on Plavix but she has a hx of anemia.  Her stent looks OK and she has been on Plavix for a year.   Dr. Kirke Corin will see her up in [place] and can decide about future treatment with Plavix  2. HTN: better  Disposition:  dc to home today.  Length of Stay: 1  Vesta Mixer, Montez Hageman., MD, [facility] 08/21/2012, 9:49 AM Office [phone] Pager [phone]

## 2012-08-21 NOTE — Care Management Note (Signed)
    Page 1 of 1   08/21/2012     11:40:06 AM   CARE MANAGEMENT NOTE 08/21/2012  Patient:  Victoria Short,Victoria Short   Account Number:  [id number]  Date Initiated:  08/21/2012  Documentation initiated by:  GRAVES-BIGELOW,Elena Davia  Subjective/Objective Assessment:   Pt admitted with cp from [facility] s/p cath and plan for d/c today.     Action/Plan:   No needs from CM at this time.   Anticipated DC Date:  08/21/2012   Anticipated DC Plan:  HOME/SELF CARE      DC Planning Services  CM consult      Choice offered to / List presented to:             Status of service:  Completed, signed off Medicare Important Message given?   (If response is "NO", the following Medicare IM given date fields will be blank) Date Medicare IM given:   Date Additional Medicare IM given:    Discharge Disposition:  HOME/SELF CARE  Per UR Regulation:  Reviewed for med. necessity/level of care/duration of stay  If discussed at Long Length of Stay Meetings, dates discussed:    Comments:

## 2012-08-21 NOTE — Discharge Summary (Signed)
Discharge Summary   Patient ID: Victoria Short MRN: [id number], DOB/AGE: [id number] 77 y.o. Admit date: 08/20/2012 D/C date:     08/21/2012  Primary Cardiologist: Kirke Corin in [place]  Primary Discharge Diagnoses:  1. CAD - THIS ADMISSION: negative enzymes, cardiac cath 08/20/12 patent stent and mild nonobstructive CAD - history: a) NSTEMI 05/2011 s/p DES to LAD, complicated by cardiogenic shock with diagnosis of Takotsubo cardiomyopathy (given appearance of LV angiography & did not correlate with LAD distribution), b) EKG changes/NSTEMI 06/2011 suspected due to spasm with normalization of EF by cath at that time - history of dyspnea with Brilinta & was unable to afford - Plavix PRU 54 05/2011, 232 in 06/2011 - discontinued 02/2012 2. HTN 3. Hypertrophic cardiomyopathy - echo 08/2012: focal basal septal hypertrophy, mildly elevated gradient across LVOT 4. Hyperglycemia - instructed to f/u PCP 5. H/o anemia - Hgb 7.8 in 06/2012 (per patient: received 2 units of blood & iron infusion with colonoscopy/EGD unrevealing for source) with f/u Hgb 11.9 in 07/2012, was 9.4 this admission - instructed to contact PCP  Secondary Discharge Diagnoses:  1. Nasal polyp 2. Carotid disease - 0-39% RICA, 40-59% LICA 12/2011 3. Iron deficiency, hx iron infusion 4. H/o pneumonia 5. Sleep apnea 7. GERD 8. Pneumonia  Hospital Course: Victoria Short is an 79 y/o F with history of CAD and HTN. Back in 05/2011, she had echo showing mild LVOT obstruction with normal EF and normal stress testing. She returned to the hospital one day later with NSTEMI and subsequent cath with DES to LAD for 80% stenosis. LV angiography showed moderately reduced LV systolic function with severe hypokinesis of the distal anterior, apical and distal inferior wall. The appearance was suggestive of stress-induced cardiomyopathy and did not correlate with the LAD distribution which did not supply the inferior wall. Her course was complicated by  cardiogenic shock requiring pressor therapy. She returned 06/2011 with CP/EKG changes/elevated CE's, and cath demonstrated normalized EF and no obstructive CAD thus there was suspicion for coronary spasm. She was switched from Plavix to Brilinta given suboptimal platelet inhibition at that time (Plavix PRU 54 05/2011, 232 in 06/2011). However, she could not afford Brilinta/had dyspnea as a side effect, so was switched back to Plavix in January 2013. This was ceased in 02/2012 for ENT surgery - because it was going to be stopped in November 2013 anyway, it was not resumed post-surgery. She has been maintained on ASA 81mg  daily since.  She presented to [facility] 08/18/12 with complaints of chest pain. It radiated to her left shoulder similar to her prior anginal pain. She took 1 SL NTG with relief after several minutes. She continued her ADLs for several hours until she had another episode, so she came to the ER. She denied palpitations, dyspnea, syncope, orthopnea, or PND. BNP was 48. CE's were negative x 3. EKG showed NSR without any acute abnormalities. Because symptoms were similar to prior angina and she had h/o false negative myoview, cardiac cath was recommended. 2D echo on 08/20/12: mild LVH, focal basal septal hypertrophy, c/w sigmoid septum, EF 60-65%, calcified nodule on a mitral chord with chordal SAM but no valvular SAM, mild MR, mildly elevated gradient across LVOT. No AS. Mild pulm HTN.  Cardiac cath 2/17 showed stable disease with patent stent in the mid LAD and mild nonobstructive CAD in the RCA/Cx, with normal LV function EF 55-60%. Her chest pain was felt noncardiac by Dr. Elease Hashimoto. Today she is doing well. Of note, glucose at  [facility] was 187 & 117 and she was instructed to f/u PCP with regards to this. She was also anemic with Hgb 9.4 (was 7.8 in 06/2012 -- s/p 2 u PRBC & iron infusion with neg colonoscopy/EGD/no obvious source of bleeding per patient, f/u Hgb 11.9 in 07/2012).  Discussed with Dr. Elease Hashimoto - we will instruct her to contact PCP for f/u Hgb later this week. She also has appt with Dr. Leandrew Koyanagi on 2/24. The patient denies any sources of bleeding. Plavix was initially added after cath, but will be discontinued due to lack of obstructive CAD, normal cardiac enzymes, and aforementioned anemia per discussion with Dr. Elease Hashimoto. Dr. Elease Hashimoto has seen and examined her today and feels she is stable for discharge.  Discharge Vitals: Blood pressure 135/75, pulse 85, temperature 98.3 F (36.8 C), temperature source Oral, resp. rate 18, height 5\' 6"  (1.676 m), weight 132 lb 11.2 oz (60.192 kg), SpO2 92.00%.  Labs: 2/15: WBC 5.0, Hgb 11.2, Hct 34.1, Plt 236  2/16: WBC 5.3, Hgb 9.4, Hct 29.4, Plt 203 INR 0.9 BNP 48 2/15: Na 135, K 3.4, Cl 99, CO2 29, BUN 16, Cr 0.62, Glu 187 -- on 2/16, repeat K 4.4 and Glu 117  Diagnostic Studies/Procedures   1. Cardiac catheterization this admission, please see full report and above for summary.  2. Discussed results with Dr. Myrtis Ser on the phone due to lack of [facility] access here at [facility]: 2D echo on 08/20/12: mild LVH, focal basal septal hypertrophy, c/w sigmoid septum, EF 60-65%, calcified nodule on a mitral chord with chordal SAM but no valvular SAM, mild MR, mildly elevated gradient across LVOT. No AS. Mild pulm HTN.  3.CXR: no evidence of acute CP dz. Cardiomegaly and large hiatal hernia.   Discharge Medications     Medication List    TAKE these medications       aspirin EC 81 MG tablet  Take 81 mg by mouth daily.     metoprolol succinate 25 MG 24 hr tablet  Commonly known as:  TOPROL-XL  TAKE ONE TABLET BY MOUTH ONCE EVERY DAY     nitroGLYCERIN 0.4 MG SL tablet  Commonly known as:  NITROSTAT  Place 1 tablet (0.4 mg total) under the tongue every 5 (five) minutes as needed for chest pain (up to 3 doses).     simvastatin 40 MG tablet  Commonly known as:  ZOCOR  TAKE 1 TABLET (40 MG TOTAL) BY MOUTH AT BEDTIME.      vitamin B-12 1000 MCG tablet  Commonly known as:  CYANOCOBALAMIN  Take 1,000 mcg by mouth daily.     vitamin E 1000 UNIT capsule  Take 1,000 Units by mouth daily.        Disposition   The patient will be discharged in stable condition to home. Discharge Orders   Future Appointments Provider Department Dept Phone   09/13/2012 2:30 PM Iran Ouch, MD [facility] (near [place]) [phone]   Future Orders Complete By Expires     Diet - low sodium heart healthy  As directed     Increase activity slowly  As directed     Comments:      No driving for 2 days. No lifting over 5 lbs for 1 week. No sexual activity for 1 week. Keep procedure site clean & dry. If you notice increased pain, swelling, bleeding or pus, call/return!  You may shower, but no soaking baths/hot tubs/pools for 1 week.      Follow-up Information  Follow up with Juliette Alcide, MD. (Please call primary care doctor to have your hemoglobin re-checked this week. Your blood sugar was also mildly elevated - please see PCP for evaluation. )    Contact information:   [address] [place] [address] [phone]       Follow up with Lorine Bears, MD. (Your followup appointment was moved to 09/13/12 at 2:30pm)    Contact information:   [address] [place] [address] [phone]          Duration of Discharge Encounter: Greater than 30 minutes including physician and PA time.  Signed, Ronie Spies PA-C 08/21/2012, 12:14 PM  Attending Note:   The patient was seen and examined.  Agree with assessment and plan as noted above.  Changes made to the above note as needed. See my note from earlier today  Alvia Grove., MD, [facility] 08/21/2012, 9:46 PM

## 2012-08-22 ENCOUNTER — Telehealth: Payer: Self-pay | Admitting: *Deleted

## 2012-08-22 NOTE — Telephone Encounter (Signed)
TOC management pc to pt. LMTCB 08/22/12.

## 2012-08-23 NOTE — Telephone Encounter (Signed)
**Note De-Identified Reneshia Zuccaro Obfuscation** Spoke with pts husband, [name], who states that the pt is doing well since her cath and discharge from hosp. He states that they plan to be out of town the month of March and that they are aware of eph f/u appt with Gene Serpe, PAC on 10/08/12 at our [place] office. [name] also states that the pt has all of her medications. Pts husband given this office phone number to call if they have any questions or concerns, he verbalized understanding.

## 2012-08-27 ENCOUNTER — Ambulatory Visit: Payer: Medicare Other | Admitting: Cardiovascular Disease

## 2012-08-27 ENCOUNTER — Ambulatory Visit (INDEPENDENT_AMBULATORY_CARE_PROVIDER_SITE_OTHER): Payer: Medicare Other | Admitting: Cardiovascular Disease

## 2012-08-27 ENCOUNTER — Encounter: Payer: Self-pay | Admitting: Cardiovascular Disease

## 2012-08-27 VITALS — BP 148/63 | HR 80 | Ht 65.0 in | Wt 133.0 lb

## 2012-08-27 DIAGNOSIS — I2510 Atherosclerotic heart disease of native coronary artery without angina pectoris: Secondary | ICD-10-CM

## 2012-08-27 DIAGNOSIS — J310 Chronic rhinitis: Secondary | ICD-10-CM | POA: Diagnosis not present

## 2012-08-27 DIAGNOSIS — E785 Hyperlipidemia, unspecified: Secondary | ICD-10-CM | POA: Diagnosis not present

## 2012-08-27 DIAGNOSIS — I10 Essential (primary) hypertension: Secondary | ICD-10-CM | POA: Diagnosis not present

## 2012-08-27 NOTE — Assessment & Plan Note (Signed)
Continue treatment with simvastatin. 

## 2012-08-27 NOTE — Patient Instructions (Addendum)
Continue same medications.  Cancel appointment in April.  Follow up in 6 months.

## 2012-08-27 NOTE — Progress Notes (Signed)
HPI  This is an 79 year old female who is here today for a followup visit. She has known history of coronary artery disease status post angioplasty and drug-eluting stent placement to the LAD in November of 2012. She also has history of simultaneous stress-induced cardiomyopathy. Her ejection fraction subsequently normalized. She presented recently with chest pain similar to her previous myocardial infarction. Interestingly, the chest pain happened after she was upset about waiting too long in a physician's office. She was transferred to [facility] where she underwent cardiac catheterization which showed patent LAD stent with no evidence of obstructive disease. Ejection fraction was normal. The catheterization was done via the right femoral artery with a Mynx closure device placed. The patient noted a knot in the right groin since then. There is no discomfort or ecchymosis. She has no further chest pain.  Allergies  Allergen Reactions  . Brilinta (Ticagrelor)     H/o dyspnea with this  . Iodine Nausea And Vomiting  . Other     streptomycin  . Shellfish Allergy     Unsteady gait - No anaphylaxis     Current Outpatient Prescriptions on File Prior to Visit  Medication Sig Dispense Refill  . aspirin EC 81 MG tablet Take 81 mg by mouth daily.      . metoprolol succinate (TOPROL-XL) 25 MG 24 hr tablet TAKE ONE TABLET BY MOUTH ONCE EVERY DAY  30 tablet  5  . nitroGLYCERIN (NITROSTAT) 0.4 MG SL tablet Place 1 tablet (0.4 mg total) under the tongue every 5 (five) minutes as needed for chest pain (up to 3 doses).  25 tablet  3  . simvastatin (ZOCOR) 40 MG tablet TAKE 1 TABLET (40 MG TOTAL) BY MOUTH AT BEDTIME.  30 tablet  3  . vitamin B-12 (CYANOCOBALAMIN) 1000 MCG tablet Take 1,000 mcg by mouth daily.      . vitamin E 1000 UNIT capsule Take 1,000 Units by mouth daily.       No current facility-administered medications on file prior to visit.     Past Medical History  Diagnosis Date   . Hypertension   . Sleep apnea   . Blood transfusion   . GERD (gastroesophageal reflux disease)   . Pneumonia   . Anemia   . Stress-induced cardiomyopathy 05/2011    a. ?Takotubso cardiomyopathy - distribution was different than that of LAD stenosis.  . Hyperlipidemia   . Coronary artery disease     a. NSTEMI 05/2011 s/p DES to LAD, with ?Takotsubo distribution of cardiomyopathy at that time with cardiogenic shock. b. NSTEMI 06/2011 ?spasm - stable CAD, normal EF. c. Patent LAD stent, nonobst CAD by cath 08/20/12 with normal EF. d. Hx dyspnea with Brilinta.  . Hypertrophic cardiomyopathy     a. echo 08/2012: focal basal septal hypertrophy, mildly elevated gradient across LVOT.  . Iron deficiency     a. Hx iron infusion.  . Nasal polyp   . Carotid artery disease     a. 0-39% RICA, 40-59% LICA 12/2011.  . Cardiogenic shock     a. 05/2011 requiring pressor therapy.  . Mitral regurgitation      Past Surgical History  Procedure Laterality Date  . Appendectomy    . Partial hysterectomy    . Abdominal hysterectomy    . Wisdom tooth extraction    . Cardiac catheterization  05/2011    80% mid LAD, 40% LCX. EF: 35-40%.   . Coronary angioplasty with stent placement  05/2011  LAD: 2.5 X 16 mm Promus DES  . Cardiac catheterization  06/22/2011    patent LAD stent. Normal EF  . Cardiac catheterization  08/2012    Patent LAD stent with normal ejection fraction.     Family History  Problem Relation Age of Onset  . Heart attack Mother 36  . Diabetic kidney disease Father 23  . Prostate cancer Father      History   Social History  . Marital Status: Married    Spouse Name: N/A    Number of Children: N/A  . Years of Education: N/A   Occupational History  . Not on file.   Social History Main Topics  . Smoking status: Former Smoker -- 1.00 packs/day for 18 years    Types: Cigarettes    Quit date: 05/11/1979  . Smokeless tobacco: Never Used  . Alcohol Use: No  . Drug  Use: No  . Sexually Active: Yes    Birth Control/ Protection: Post-menopausal   Other Topics Concern  . Not on file   Social History Narrative  . No narrative on file     PHYSICAL EXAM   BP 148/63  Pulse 80  Ht 5\' 5"  (1.651 m)  Wt 133 lb (60.328 kg)  BMI 22.13 kg/m2  Constitutional: She is oriented to person, place, and time. She appears well-developed and well-nourished. No distress.  HENT: No nasal discharge.  Head: Normocephalic and atraumatic.  Eyes: Pupils are equal and round. Right eye exhibits no discharge. Left eye exhibits no discharge.  Neck: Normal range of motion. Neck supple. No JVD present. No thyromegaly present.  Cardiovascular: Normal rate, regular rhythm, normal heart sounds. Exam reveals no gallop and no friction rub. No murmur heard.  Pulmonary/Chest: Effort normal and breath sounds normal. No stridor. No respiratory distress. She has no wheezes. She has no rales. She exhibits no tenderness.  Abdominal: Soft. Bowel sounds are normal. She exhibits no distension. There is no tenderness. There is no rebound and no guarding.  Musculoskeletal: Normal range of motion. She exhibits no edema and no tenderness.  Neurological: She is alert and oriented to person, place, and time. Coordination normal.  Skin: Skin is warm and dry. No rash noted. She is not diaphoretic. No erythema. No pallor.  Psychiatric: She has a normal mood and affect. Her behavior is normal. Judgment and thought content normal.  Right groin: There is a small scar tissue I suspect related to the collagen plug of the Mynx device. There is no tenderness and no bruit. No ecchymosis is noted.    ASSESSMENT AND PLAN

## 2012-08-27 NOTE — Assessment & Plan Note (Signed)
Blood pressure is reasonably controlled. 

## 2012-08-27 NOTE — Assessment & Plan Note (Signed)
Recent cardiac catheterization showed patent LAD stent with no other obstructive disease. Continue medical therapy. Continue aspirin daily without Plavix. She has a small knot in the right groin which seems to be related to the collagen plug of the Mynx closure device. There is no tenderness and no evidence of pseudoaneurysm. I reassured her about this and asked her to update [place] if there is any worsening.

## 2012-09-13 ENCOUNTER — Ambulatory Visit: Payer: Medicare Other | Admitting: Cardiovascular Disease

## 2012-10-08 ENCOUNTER — Ambulatory Visit: Payer: Medicare Other | Admitting: Physician Assistant

## 2012-10-23 DIAGNOSIS — D649 Anemia, unspecified: Secondary | ICD-10-CM | POA: Diagnosis not present

## 2012-10-23 DIAGNOSIS — I10 Essential (primary) hypertension: Secondary | ICD-10-CM | POA: Diagnosis not present

## 2012-10-23 DIAGNOSIS — D509 Iron deficiency anemia, unspecified: Secondary | ICD-10-CM | POA: Diagnosis not present

## 2012-10-23 DIAGNOSIS — R7301 Impaired fasting glucose: Secondary | ICD-10-CM | POA: Diagnosis not present

## 2012-10-23 DIAGNOSIS — E782 Mixed hyperlipidemia: Secondary | ICD-10-CM | POA: Diagnosis not present

## 2012-10-26 DIAGNOSIS — D649 Anemia, unspecified: Secondary | ICD-10-CM | POA: Diagnosis not present

## 2012-10-30 DIAGNOSIS — I10 Essential (primary) hypertension: Secondary | ICD-10-CM | POA: Diagnosis not present

## 2012-10-30 DIAGNOSIS — I252 Old myocardial infarction: Secondary | ICD-10-CM | POA: Diagnosis not present

## 2012-10-30 DIAGNOSIS — D509 Iron deficiency anemia, unspecified: Secondary | ICD-10-CM | POA: Diagnosis not present

## 2012-10-30 DIAGNOSIS — R42 Dizziness and giddiness: Secondary | ICD-10-CM | POA: Diagnosis not present

## 2012-10-30 DIAGNOSIS — R7301 Impaired fasting glucose: Secondary | ICD-10-CM | POA: Diagnosis not present

## 2012-10-30 DIAGNOSIS — D649 Anemia, unspecified: Secondary | ICD-10-CM | POA: Diagnosis not present

## 2012-10-30 DIAGNOSIS — E782 Mixed hyperlipidemia: Secondary | ICD-10-CM | POA: Diagnosis not present

## 2012-10-31 DIAGNOSIS — D649 Anemia, unspecified: Secondary | ICD-10-CM | POA: Diagnosis not present

## 2012-10-31 DIAGNOSIS — R0602 Shortness of breath: Secondary | ICD-10-CM | POA: Diagnosis not present

## 2012-11-08 DIAGNOSIS — Z79899 Other long term (current) drug therapy: Secondary | ICD-10-CM | POA: Diagnosis not present

## 2012-11-08 DIAGNOSIS — I252 Old myocardial infarction: Secondary | ICD-10-CM | POA: Diagnosis not present

## 2012-11-08 DIAGNOSIS — R42 Dizziness and giddiness: Secondary | ICD-10-CM | POA: Diagnosis not present

## 2012-11-08 DIAGNOSIS — I10 Essential (primary) hypertension: Secondary | ICD-10-CM | POA: Diagnosis not present

## 2012-11-08 DIAGNOSIS — Z7982 Long term (current) use of aspirin: Secondary | ICD-10-CM | POA: Diagnosis not present

## 2012-11-08 DIAGNOSIS — Z862 Personal history of diseases of the blood and blood-forming organs and certain disorders involving the immune mechanism: Secondary | ICD-10-CM | POA: Diagnosis not present

## 2012-11-08 DIAGNOSIS — D509 Iron deficiency anemia, unspecified: Secondary | ICD-10-CM | POA: Diagnosis not present

## 2012-12-19 ENCOUNTER — Other Ambulatory Visit: Payer: Self-pay | Admitting: *Deleted

## 2012-12-19 DIAGNOSIS — I2510 Atherosclerotic heart disease of native coronary artery without angina pectoris: Secondary | ICD-10-CM

## 2012-12-21 DIAGNOSIS — R5381 Other malaise: Secondary | ICD-10-CM | POA: Diagnosis not present

## 2012-12-21 DIAGNOSIS — D509 Iron deficiency anemia, unspecified: Secondary | ICD-10-CM | POA: Diagnosis not present

## 2012-12-21 DIAGNOSIS — D649 Anemia, unspecified: Secondary | ICD-10-CM | POA: Diagnosis not present

## 2012-12-21 DIAGNOSIS — E782 Mixed hyperlipidemia: Secondary | ICD-10-CM | POA: Diagnosis not present

## 2012-12-26 ENCOUNTER — Encounter (INDEPENDENT_AMBULATORY_CARE_PROVIDER_SITE_OTHER): Payer: Medicare Other

## 2012-12-26 DIAGNOSIS — I6529 Occlusion and stenosis of unspecified carotid artery: Secondary | ICD-10-CM | POA: Diagnosis not present

## 2012-12-26 DIAGNOSIS — I2510 Atherosclerotic heart disease of native coronary artery without angina pectoris: Secondary | ICD-10-CM

## 2012-12-28 DIAGNOSIS — E782 Mixed hyperlipidemia: Secondary | ICD-10-CM | POA: Diagnosis not present

## 2012-12-28 DIAGNOSIS — L989 Disorder of the skin and subcutaneous tissue, unspecified: Secondary | ICD-10-CM | POA: Diagnosis not present

## 2012-12-28 DIAGNOSIS — R42 Dizziness and giddiness: Secondary | ICD-10-CM | POA: Diagnosis not present

## 2012-12-28 DIAGNOSIS — I10 Essential (primary) hypertension: Secondary | ICD-10-CM | POA: Diagnosis not present

## 2012-12-28 DIAGNOSIS — D509 Iron deficiency anemia, unspecified: Secondary | ICD-10-CM | POA: Diagnosis not present

## 2012-12-28 DIAGNOSIS — R0602 Shortness of breath: Secondary | ICD-10-CM | POA: Diagnosis not present

## 2012-12-28 DIAGNOSIS — R7301 Impaired fasting glucose: Secondary | ICD-10-CM | POA: Diagnosis not present

## 2012-12-28 DIAGNOSIS — I252 Old myocardial infarction: Secondary | ICD-10-CM | POA: Diagnosis not present

## 2013-01-02 DIAGNOSIS — R42 Dizziness and giddiness: Secondary | ICD-10-CM | POA: Diagnosis not present

## 2013-01-02 DIAGNOSIS — R220 Localized swelling, mass and lump, head: Secondary | ICD-10-CM | POA: Diagnosis not present

## 2013-01-02 DIAGNOSIS — R269 Unspecified abnormalities of gait and mobility: Secondary | ICD-10-CM | POA: Diagnosis not present

## 2013-01-03 ENCOUNTER — Telehealth: Payer: Self-pay | Admitting: *Deleted

## 2013-01-03 DIAGNOSIS — D443 Neoplasm of uncertain behavior of pituitary gland: Secondary | ICD-10-CM | POA: Diagnosis not present

## 2013-01-03 DIAGNOSIS — G9389 Other specified disorders of brain: Secondary | ICD-10-CM | POA: Diagnosis not present

## 2013-01-03 NOTE — Telephone Encounter (Signed)
Message copied by Lesle Chris on Thu Jan 03, 2013 10:42 AM ------      Message from: Lorine Bears A      Created: Mon Dec 31, 2012 10:03 AM       Inform patient that carotid doppler showed no significant change since last year. Recommend repeat study in 2 year. ------

## 2013-01-03 NOTE — Telephone Encounter (Signed)
Notes Recorded by Lesle Chris, LPN on 07/09/1094 at 10:42 AM Patient notified and verbalized understanding.   Has 6 mo follow up scheduled for 02/27/2013 with Dr. Purvis Sheffield.

## 2013-01-10 DIAGNOSIS — D352 Benign neoplasm of pituitary gland: Secondary | ICD-10-CM | POA: Diagnosis not present

## 2013-01-10 DIAGNOSIS — D353 Benign neoplasm of craniopharyngeal duct: Secondary | ICD-10-CM | POA: Diagnosis not present

## 2013-01-10 DIAGNOSIS — R42 Dizziness and giddiness: Secondary | ICD-10-CM | POA: Diagnosis not present

## 2013-01-15 DIAGNOSIS — D352 Benign neoplasm of pituitary gland: Secondary | ICD-10-CM | POA: Diagnosis not present

## 2013-01-25 DIAGNOSIS — L821 Other seborrheic keratosis: Secondary | ICD-10-CM | POA: Diagnosis not present

## 2013-01-25 DIAGNOSIS — L820 Inflamed seborrheic keratosis: Secondary | ICD-10-CM | POA: Diagnosis not present

## 2013-01-25 DIAGNOSIS — D485 Neoplasm of uncertain behavior of skin: Secondary | ICD-10-CM | POA: Diagnosis not present

## 2013-01-30 DIAGNOSIS — D352 Benign neoplasm of pituitary gland: Secondary | ICD-10-CM | POA: Diagnosis not present

## 2013-01-30 DIAGNOSIS — D353 Benign neoplasm of craniopharyngeal duct: Secondary | ICD-10-CM | POA: Diagnosis not present

## 2013-02-27 ENCOUNTER — Ambulatory Visit (INDEPENDENT_AMBULATORY_CARE_PROVIDER_SITE_OTHER): Payer: Medicare Other | Admitting: Cardiovascular Disease

## 2013-02-27 VITALS — BP 128/68 | HR 69 | Ht 66.0 in | Wt 139.8 lb

## 2013-02-27 DIAGNOSIS — E785 Hyperlipidemia, unspecified: Secondary | ICD-10-CM

## 2013-02-27 DIAGNOSIS — I658 Occlusion and stenosis of other precerebral arteries: Secondary | ICD-10-CM

## 2013-02-27 DIAGNOSIS — I2510 Atherosclerotic heart disease of native coronary artery without angina pectoris: Secondary | ICD-10-CM

## 2013-02-27 DIAGNOSIS — I10 Essential (primary) hypertension: Secondary | ICD-10-CM | POA: Diagnosis not present

## 2013-02-27 DIAGNOSIS — I5181 Takotsubo syndrome: Secondary | ICD-10-CM | POA: Diagnosis not present

## 2013-02-27 DIAGNOSIS — I6523 Occlusion and stenosis of bilateral carotid arteries: Secondary | ICD-10-CM

## 2013-02-27 DIAGNOSIS — I6529 Occlusion and stenosis of unspecified carotid artery: Secondary | ICD-10-CM

## 2013-02-27 NOTE — Patient Instructions (Signed)
Continue all current medications. Your physician wants you to follow up in:  1 year.  You will receive a reminder letter in the mail one-two months in advance.  If you don't receive a letter, please call our office to schedule the follow up appointment   

## 2013-02-27 NOTE — Progress Notes (Signed)
Patient ID: Victoria Short, female   DOB: 1924/11/09, 79 y.o.   MRN: [id number]    SUBJECTIVE: Victoria Short is an 79 year old female who is here today for a followup visit. She has a known history of coronary artery disease status post angioplasty and drug-eluting stent placement to the LAD in November of 2012. She also has history of simultaneous stress-induced cardiomyopathy. Her ejection fraction subsequently normalized. Earlier this year, she presented with chest pain similar to her previous myocardial infarction. Interestingly, the chest pain happened after she was upset about waiting too long in a physician's office. She was transferred to [facility] where she underwent cardiac catheterization which showed patent LAD stent with no evidence of obstructive disease. Ejection fraction was normal.  She denies chest pain and shortness of breath. She's been having dizzy spells, but feels well today. It has been attributed to inner ear disease. She denies palpitations and syncope. Her BP hasn't been low at her doctor's appointments. She's been receiving IV iron for anemia.   Past Medical History   Diagnosis  Date   .  Hypertension    .  Sleep apnea    .  Blood transfusion    .  GERD (gastroesophageal reflux disease)    .  Pneumonia    .  Anemia    .  Stress-induced cardiomyopathy  05/2011     a. ?Takotubso cardiomyopathy - distribution was different than that of LAD stenosis.   .  Hyperlipidemia    .  Coronary artery disease      a. NSTEMI 05/2011 s/p DES to LAD, with ?Takotsubo distribution of cardiomyopathy at that time with cardiogenic shock. b. NSTEMI 06/2011 ?spasm - stable CAD, normal EF. c. Patent LAD stent, nonobst CAD by cath 08/20/12 with normal EF. d. Hx dyspnea with Brilinta.   .  Hypertrophic cardiomyopathy      a. echo 08/2012: focal basal septal hypertrophy, mildly elevated gradient across LVOT.   .  Iron deficiency      a. Hx iron infusion.   .  Nasal polyp    .   Carotid artery disease      a. 0-39% RICA, 40-59% LICA 12/2011.   .  Cardiogenic shock      a. 05/2011 requiring pressor therapy.   .  Mitral regurgitation     Past Surgical History   Procedure  Laterality  Date   .  Appendectomy     .  Partial hysterectomy     .  Abdominal hysterectomy     .  Wisdom tooth extraction     .  Cardiac catheterization   05/2011     80% mid LAD, 40% LCX. EF: 35-40%.   .  Coronary angioplasty with stent placement   05/2011     LAD: 2.5 X 16 mm Promus DES   .  Cardiac catheterization   06/22/2011     patent LAD stent. Normal EF   .  Cardiac catheterization   08/2012     Patent LAD stent with normal ejection fraction.    BP: 128/68  Pulse: 69   PHYSICAL EXAM General: NAD Neck: No JVD, no thyromegaly or thyroid nodule.  Lungs: Clear to auscultation bilaterally with normal respiratory effort. CV: Nondisplaced PMI.  Heart regular S1/S2, no S3/S4, II/VI pansystolic murmur.  No peripheral edema.  No carotid bruit.  Normal pedal pulses.  Abdomen: Soft, nontender, no hepatosplenomegaly, no distention.  Neurologic: Alert and oriented x 3.  Psych: Normal affect.  Extremities: No clubbing or cyanosis.     LABS: Basic Metabolic Panel: No results found for this basename: NA, K, CL, CO2, GLUCOSE, BUN, CREATININE, CALCIUM, MG, PHOS,  in the last 72 hours Liver Function Tests: No results found for this basename: AST, ALT, ALKPHOS, BILITOT, PROT, ALBUMIN,  in the last 72 hours No results found for this basename: LIPASE, AMYLASE,  in the last 72 hours CBC: No results found for this basename: WBC, NEUTROABS, HGB, HCT, MCV, PLT,  in the last 72 hours Cardiac Enzymes: No results found for this basename: CKTOTAL, CKMB, CKMBINDEX, TROPONINI,  in the last 72 hours BNP: No components found with this basename: POCBNP,  D-Dimer: No results found for this basename: DDIMER,  in the last 72 hours Hemoglobin A1C: No results found for this basename: HGBA1C,  in the last  72 hours Fasting Lipid Panel: No results found for this basename: CHOL, HDL, LDLCALC, TRIG, CHOLHDL, LDLDIRECT,  in the last 72 hours Thyroid Function Tests: No results found for this basename: TSH, T4TOTAL, FREET3, T3FREE, THYROIDAB,  in the last 72 hours Anemia Panel: No results found for this basename: VITAMINB12, FOLATE, FERRITIN, TIBC, IRON, RETICCTPCT,  in the last 72 hours  RADIOLOGY: No results found.    ASSESSMENT AND PLAN: 1. CAD: asymptomatic and stable. No changes in current therapy. 2. HTN: controlled on current therapy. 3. Hyperlipidemia: managed by Dr. Leandrew Koyanagi (PCP). 4. Carotid artery stenosis: mild bilaterally (1-39%) in June 2014. Repeat in one year.   Prentice Docker, M.D., F.A.C.C.

## 2013-04-18 DIAGNOSIS — Z23 Encounter for immunization: Secondary | ICD-10-CM | POA: Diagnosis not present

## 2013-06-11 ENCOUNTER — Other Ambulatory Visit (HOSPITAL_COMMUNITY): Payer: Self-pay | Admitting: Neurosurgery

## 2013-06-11 DIAGNOSIS — D352 Benign neoplasm of pituitary gland: Secondary | ICD-10-CM

## 2013-06-14 ENCOUNTER — Ambulatory Visit (HOSPITAL_COMMUNITY)
Admission: RE | Admit: 2013-06-14 | Discharge: 2013-06-14 | Disposition: A | Discharge status: Home / Self Care | Payer: Medicare Other | Source: Ambulatory Visit | Attending: Neurosurgery | Admitting: Neurosurgery

## 2013-06-14 DIAGNOSIS — R220 Localized swelling, mass and lump, head: Secondary | ICD-10-CM | POA: Diagnosis not present

## 2013-06-14 DIAGNOSIS — E237 Disorder of pituitary gland, unspecified: Secondary | ICD-10-CM | POA: Insufficient documentation

## 2013-06-14 DIAGNOSIS — D352 Benign neoplasm of pituitary gland: Secondary | ICD-10-CM

## 2013-06-14 LAB — POCT I-STAT, CHEM 8
BUN: 12 mg/dL (ref 6–23)
Calcium, Ion: 1.24 mmol/L (ref 1.13–1.30)
Chloride: 100 mEq/L (ref 96–112)
Creatinine, Ser: 0.7 mg/dL (ref 0.50–1.10)
Glucose, Bld: 80 mg/dL (ref 70–99)
HCT: 30 % — ABNORMAL LOW (ref 36.0–46.0)

## 2013-06-14 MED ORDER — GADOBENATE DIMEGLUMINE 529 MG/ML IV SOLN
7.0000 mL | Freq: Once | INTRAVENOUS | Status: AC | PRN
  Administered 2013-06-14: 7 mL via INTRAVENOUS

## 2013-06-17 DIAGNOSIS — R7301 Impaired fasting glucose: Secondary | ICD-10-CM | POA: Diagnosis not present

## 2013-06-17 DIAGNOSIS — H8110 Benign paroxysmal vertigo, unspecified ear: Secondary | ICD-10-CM | POA: Diagnosis not present

## 2013-06-17 DIAGNOSIS — D509 Iron deficiency anemia, unspecified: Secondary | ICD-10-CM | POA: Diagnosis not present

## 2013-06-17 DIAGNOSIS — E538 Deficiency of other specified B group vitamins: Secondary | ICD-10-CM | POA: Diagnosis not present

## 2013-06-17 DIAGNOSIS — E782 Mixed hyperlipidemia: Secondary | ICD-10-CM | POA: Diagnosis not present

## 2013-06-17 DIAGNOSIS — I10 Essential (primary) hypertension: Secondary | ICD-10-CM | POA: Diagnosis not present

## 2013-06-17 DIAGNOSIS — R42 Dizziness and giddiness: Secondary | ICD-10-CM | POA: Diagnosis not present

## 2013-06-17 DIAGNOSIS — I252 Old myocardial infarction: Secondary | ICD-10-CM | POA: Diagnosis not present

## 2013-07-01 DIAGNOSIS — R0602 Shortness of breath: Secondary | ICD-10-CM | POA: Diagnosis not present

## 2013-07-01 DIAGNOSIS — D649 Anemia, unspecified: Secondary | ICD-10-CM | POA: Diagnosis not present

## 2013-07-23 DIAGNOSIS — E782 Mixed hyperlipidemia: Secondary | ICD-10-CM | POA: Diagnosis not present

## 2013-07-23 DIAGNOSIS — D509 Iron deficiency anemia, unspecified: Secondary | ICD-10-CM | POA: Diagnosis not present

## 2013-07-23 DIAGNOSIS — R5381 Other malaise: Secondary | ICD-10-CM | POA: Diagnosis not present

## 2013-07-23 DIAGNOSIS — R5383 Other fatigue: Secondary | ICD-10-CM | POA: Diagnosis not present

## 2013-07-23 DIAGNOSIS — I10 Essential (primary) hypertension: Secondary | ICD-10-CM | POA: Diagnosis not present

## 2013-07-29 DIAGNOSIS — D509 Iron deficiency anemia, unspecified: Secondary | ICD-10-CM | POA: Diagnosis not present

## 2013-07-29 DIAGNOSIS — H8110 Benign paroxysmal vertigo, unspecified ear: Secondary | ICD-10-CM | POA: Diagnosis not present

## 2013-07-29 DIAGNOSIS — R5381 Other malaise: Secondary | ICD-10-CM | POA: Diagnosis not present

## 2013-07-29 DIAGNOSIS — I10 Essential (primary) hypertension: Secondary | ICD-10-CM | POA: Diagnosis not present

## 2013-07-29 DIAGNOSIS — E782 Mixed hyperlipidemia: Secondary | ICD-10-CM | POA: Diagnosis not present

## 2013-07-29 DIAGNOSIS — E119 Type 2 diabetes mellitus without complications: Secondary | ICD-10-CM | POA: Diagnosis not present

## 2013-07-29 DIAGNOSIS — I252 Old myocardial infarction: Secondary | ICD-10-CM | POA: Diagnosis not present

## 2013-07-29 DIAGNOSIS — R42 Dizziness and giddiness: Secondary | ICD-10-CM | POA: Diagnosis not present

## 2013-07-29 DIAGNOSIS — R5383 Other fatigue: Secondary | ICD-10-CM | POA: Diagnosis not present

## 2013-10-22 DIAGNOSIS — D509 Iron deficiency anemia, unspecified: Secondary | ICD-10-CM | POA: Diagnosis not present

## 2013-10-22 DIAGNOSIS — E782 Mixed hyperlipidemia: Secondary | ICD-10-CM | POA: Diagnosis not present

## 2013-10-22 DIAGNOSIS — I10 Essential (primary) hypertension: Secondary | ICD-10-CM | POA: Diagnosis not present

## 2013-10-22 DIAGNOSIS — E119 Type 2 diabetes mellitus without complications: Secondary | ICD-10-CM | POA: Diagnosis not present

## 2013-10-23 DIAGNOSIS — H43399 Other vitreous opacities, unspecified eye: Secondary | ICD-10-CM | POA: Diagnosis not present

## 2013-10-23 DIAGNOSIS — B029 Zoster without complications: Secondary | ICD-10-CM | POA: Diagnosis not present

## 2013-10-29 DIAGNOSIS — I10 Essential (primary) hypertension: Secondary | ICD-10-CM | POA: Diagnosis not present

## 2013-10-29 DIAGNOSIS — I252 Old myocardial infarction: Secondary | ICD-10-CM | POA: Diagnosis not present

## 2013-10-29 DIAGNOSIS — E782 Mixed hyperlipidemia: Secondary | ICD-10-CM | POA: Diagnosis not present

## 2013-10-29 DIAGNOSIS — H8110 Benign paroxysmal vertigo, unspecified ear: Secondary | ICD-10-CM | POA: Diagnosis not present

## 2013-10-29 DIAGNOSIS — D509 Iron deficiency anemia, unspecified: Secondary | ICD-10-CM | POA: Diagnosis not present

## 2013-10-29 DIAGNOSIS — R42 Dizziness and giddiness: Secondary | ICD-10-CM | POA: Diagnosis not present

## 2013-10-29 DIAGNOSIS — E119 Type 2 diabetes mellitus without complications: Secondary | ICD-10-CM | POA: Diagnosis not present

## 2013-10-29 DIAGNOSIS — R5381 Other malaise: Secondary | ICD-10-CM | POA: Diagnosis not present

## 2013-12-23 ENCOUNTER — Other Ambulatory Visit (HOSPITAL_COMMUNITY): Payer: Self-pay | Admitting: Neurosurgery

## 2013-12-23 DIAGNOSIS — D353 Benign neoplasm of craniopharyngeal duct: Principal | ICD-10-CM

## 2013-12-23 DIAGNOSIS — D352 Benign neoplasm of pituitary gland: Secondary | ICD-10-CM

## 2013-12-30 ENCOUNTER — Ambulatory Visit (HOSPITAL_COMMUNITY)
Admission: RE | Admit: 2013-12-30 | Discharge: 2013-12-30 | Disposition: A | Discharge status: Home / Self Care | Payer: Medicare Other | Source: Ambulatory Visit | Attending: Neurosurgery | Admitting: Neurosurgery

## 2013-12-30 DIAGNOSIS — E237 Disorder of pituitary gland, unspecified: Secondary | ICD-10-CM | POA: Diagnosis not present

## 2013-12-30 DIAGNOSIS — D352 Benign neoplasm of pituitary gland: Secondary | ICD-10-CM | POA: Diagnosis not present

## 2013-12-30 DIAGNOSIS — D353 Benign neoplasm of craniopharyngeal duct: Principal | ICD-10-CM

## 2013-12-30 LAB — POCT I-STAT CREATININE: CREATININE: 0.7 mg/dL (ref 0.50–1.10)

## 2013-12-30 MED ORDER — GADOBENATE DIMEGLUMINE 529 MG/ML IV SOLN
6.0000 mL | Freq: Once | INTRAVENOUS | Status: AC | PRN
  Administered 2013-12-30: 6 mL via INTRAVENOUS

## 2013-12-31 DIAGNOSIS — D353 Benign neoplasm of craniopharyngeal duct: Secondary | ICD-10-CM | POA: Diagnosis not present

## 2013-12-31 DIAGNOSIS — D352 Benign neoplasm of pituitary gland: Secondary | ICD-10-CM | POA: Diagnosis not present

## 2013-12-31 DIAGNOSIS — I10 Essential (primary) hypertension: Secondary | ICD-10-CM | POA: Diagnosis not present

## 2014-02-14 ENCOUNTER — Encounter: Payer: Self-pay | Admitting: Cardiovascular Disease

## 2014-02-14 ENCOUNTER — Ambulatory Visit (INDEPENDENT_AMBULATORY_CARE_PROVIDER_SITE_OTHER): Payer: Medicare Other | Admitting: Cardiovascular Disease

## 2014-02-14 VITALS — BP 135/64 | HR 65 | Wt 134.0 lb

## 2014-02-14 DIAGNOSIS — E785 Hyperlipidemia, unspecified: Secondary | ICD-10-CM

## 2014-02-14 DIAGNOSIS — I10 Essential (primary) hypertension: Secondary | ICD-10-CM

## 2014-02-14 DIAGNOSIS — I6529 Occlusion and stenosis of unspecified carotid artery: Secondary | ICD-10-CM | POA: Diagnosis not present

## 2014-02-14 DIAGNOSIS — I658 Occlusion and stenosis of other precerebral arteries: Secondary | ICD-10-CM | POA: Diagnosis not present

## 2014-02-14 DIAGNOSIS — I2510 Atherosclerotic heart disease of native coronary artery without angina pectoris: Secondary | ICD-10-CM

## 2014-02-14 DIAGNOSIS — I5181 Takotsubo syndrome: Secondary | ICD-10-CM | POA: Diagnosis not present

## 2014-02-14 DIAGNOSIS — D508 Other iron deficiency anemias: Secondary | ICD-10-CM

## 2014-02-14 DIAGNOSIS — I6523 Occlusion and stenosis of bilateral carotid arteries: Secondary | ICD-10-CM

## 2014-02-14 NOTE — Patient Instructions (Signed)

## 2014-02-14 NOTE — Progress Notes (Signed)
Patient ID: Victoria Short, female   DOB: 03/26/1925, 80 y.o.   MRN: [id number]      SUBJECTIVE: Mrs. Slider is an 80 year old female who is here for a routine followup visit. She has a history of coronary artery disease status post angioplasty and drug-eluting stent placement to the LAD in November of 2012. She also has history of stress-induced cardiomyopathy. Her ejection fraction subsequently normalized. In 2014, she presented with chest pain similar to her previous myocardial infarction. Interestingly, the chest pain happened after she was upset about waiting too long in a physician's office. She was transferred to [facility] where she underwent cardiac catheterization which showed a patent LAD stent with no evidence of obstructive disease. Ejection fraction was normal.  She denies chest pain and shortness of breath. She has a history of iron deficiency anemia of uncertain etiology. She said a colonoscopy was normal 2 years ago. She denies hematochezia and melena. She has not tolerated tablets as they cause her to be constipated. She receives intravenous infusions approximately every 4 months. When her iron levels become low, she craves ice. She tells me she was diagnosed with a pituitary gland tumor which is not malignant.  ECG performed in the office today demonstrates normal sinus rhythm.  Soc: She and her husband, who is also my patient, moved here from [place] in 2003.   Review of Systems: As per "subjective", otherwise negative.  Allergies  Allergen Reactions  . Brilinta [Ticagrelor]     H/o dyspnea with this  . Iodine Nausea And Vomiting  . Other     streptomycin  . Shellfish Allergy     Unsteady gait - No anaphylaxis    Current Outpatient Prescriptions  Medication Sig Dispense Refill  . aspirin EC 81 MG tablet Take 81 mg by mouth daily.      . diphenhydrAMINE (BENADRYL) 25 mg capsule Take 25 mg by mouth daily.      . metoprolol succinate (TOPROL-XL) 25 MG 24 hr  tablet TAKE ONE TABLET BY MOUTH ONCE EVERY DAY  30 tablet  5  . simvastatin (ZOCOR) 40 MG tablet TAKE 1 TABLET (40 MG TOTAL) BY MOUTH AT BEDTIME.  30 tablet  3   No current facility-administered medications for this visit.    Past Medical History  Diagnosis Date  . Hypertension   . Sleep apnea   . Blood transfusion   . GERD (gastroesophageal reflux disease)   . Pneumonia   . Anemia   . Stress-induced cardiomyopathy 05/2011    a. ?Takotubso cardiomyopathy - distribution was different than that of LAD stenosis.  . Hyperlipidemia   . Coronary artery disease     a. NSTEMI 05/2011 s/p DES to LAD, with ?Takotsubo distribution of cardiomyopathy at that time with cardiogenic shock. b. NSTEMI 06/2011 ?spasm - stable CAD, normal EF. c. Patent LAD stent, nonobst CAD by cath 08/20/12 with normal EF. d. Hx dyspnea with Brilinta.  . Hypertrophic cardiomyopathy     a. echo 08/2012: focal basal septal hypertrophy, mildly elevated gradient 12mmHg across LVOT.  . Iron deficiency     a. Hx iron infusion.  . Nasal polyp   . Carotid artery disease     a. 0-26% RICA, 37-85% LICA 02/8501.  . Cardiogenic shock     a. 05/2011 requiring pressor therapy.  . Mitral regurgitation     Past Surgical History  Procedure Laterality Date  . Appendectomy    . Partial hysterectomy    . Abdominal hysterectomy    .  Wisdom tooth extraction    . Cardiac catheterization  05/2011    80% mid LAD, 40% LCX. EF: 35-40%.   . Coronary angioplasty with stent placement  05/2011    LAD: 2.5 X 16 mm Promus DES  . Cardiac catheterization  06/22/2011    patent LAD stent. Normal EF  . Cardiac catheterization  08/2012    Patent LAD stent with normal ejection fraction.    History   Social History  . Marital Status: Married    Spouse Name: N/A    Number of Children: N/A  . Years of Education: N/A   Occupational History  . Not on file.   Social History Main Topics  . Smoking status: Former Smoker -- 1.00 packs/day for  18 years    Types: Cigarettes    Quit date: 05/11/1979  . Smokeless tobacco: Never Used  . Alcohol Use: No  . Drug Use: No  . Sexual Activity: Yes    Birth Control/ Protection: Post-menopausal   Other Topics Concern  . Not on file   Social History Narrative  . No narrative on file     Filed Vitals:   02/14/14 1054  BP: 135/64  Pulse: 65  Weight: 134 lb (60.782 kg)  SpO2: 96%    PHYSICAL EXAM General: NAD  Neck: No JVD, no thyromegaly or thyroid nodule.  Lungs: Clear to auscultation bilaterally with normal respiratory effort.  CV: Nondisplaced PMI. Heart regular S1/S2, no S3/S4, I/VI soft pansystolic murmur. No peripheral edema. No carotid bruit. Normal pedal pulses.  Abdomen: Soft, nontender, no hepatosplenomegaly, no distention.  Neurologic: Alert and oriented x 3.  Psych: Normal affect.  Extremities: No clubbing or cyanosis.   ECG: reviewed and available in electronic records.      ASSESSMENT AND PLAN: 1. CAD: Stable ischemic heart disease. No changes in current therapy, which includes ASA, beta blocker and statin. 2. Essential HTN: Well controlled on current therapy.  3. Hyperlipidemia: Managed by Dr. Pleas Koch (PCP).  4. Carotid artery stenosis: mild bilaterally (1-39%) in June 2014. Repeat in June 2016. No bruits appreciated. 5. Iron deficiency anemia: Given that her colonoscopy was normal, this may be a function of either poor absorption or decreased production. She may be seeing a hematologist in the near future.  Dispo: f/u 1 year.  [name], M.D., F.A.C.C.

## 2014-02-18 DIAGNOSIS — K625 Hemorrhage of anus and rectum: Secondary | ICD-10-CM | POA: Diagnosis not present

## 2014-02-18 DIAGNOSIS — K644 Residual hemorrhoidal skin tags: Secondary | ICD-10-CM | POA: Diagnosis not present

## 2014-02-18 DIAGNOSIS — Z7982 Long term (current) use of aspirin: Secondary | ICD-10-CM | POA: Diagnosis not present

## 2014-02-18 DIAGNOSIS — K219 Gastro-esophageal reflux disease without esophagitis: Secondary | ICD-10-CM | POA: Diagnosis not present

## 2014-02-18 DIAGNOSIS — Z87891 Personal history of nicotine dependence: Secondary | ICD-10-CM | POA: Diagnosis not present

## 2014-02-18 DIAGNOSIS — I10 Essential (primary) hypertension: Secondary | ICD-10-CM | POA: Diagnosis not present

## 2014-02-18 DIAGNOSIS — Z79899 Other long term (current) drug therapy: Secondary | ICD-10-CM | POA: Diagnosis not present

## 2014-02-18 DIAGNOSIS — K449 Diaphragmatic hernia without obstruction or gangrene: Secondary | ICD-10-CM | POA: Diagnosis not present

## 2014-02-18 DIAGNOSIS — I252 Old myocardial infarction: Secondary | ICD-10-CM | POA: Diagnosis not present

## 2014-02-18 DIAGNOSIS — K649 Unspecified hemorrhoids: Secondary | ICD-10-CM | POA: Diagnosis not present

## 2014-02-18 DIAGNOSIS — K645 Perianal venous thrombosis: Secondary | ICD-10-CM | POA: Diagnosis not present

## 2014-02-18 DIAGNOSIS — K5730 Diverticulosis of large intestine without perforation or abscess without bleeding: Secondary | ICD-10-CM | POA: Diagnosis not present

## 2014-02-20 DIAGNOSIS — D509 Iron deficiency anemia, unspecified: Secondary | ICD-10-CM | POA: Diagnosis not present

## 2014-02-20 DIAGNOSIS — E119 Type 2 diabetes mellitus without complications: Secondary | ICD-10-CM | POA: Diagnosis not present

## 2014-02-20 DIAGNOSIS — I10 Essential (primary) hypertension: Secondary | ICD-10-CM | POA: Diagnosis not present

## 2014-02-20 DIAGNOSIS — R42 Dizziness and giddiness: Secondary | ICD-10-CM | POA: Diagnosis not present

## 2014-02-20 DIAGNOSIS — I252 Old myocardial infarction: Secondary | ICD-10-CM | POA: Diagnosis not present

## 2014-02-20 DIAGNOSIS — R5381 Other malaise: Secondary | ICD-10-CM | POA: Diagnosis not present

## 2014-02-20 DIAGNOSIS — H8110 Benign paroxysmal vertigo, unspecified ear: Secondary | ICD-10-CM | POA: Diagnosis not present

## 2014-02-20 DIAGNOSIS — E782 Mixed hyperlipidemia: Secondary | ICD-10-CM | POA: Diagnosis not present

## 2014-02-20 DIAGNOSIS — R5383 Other fatigue: Secondary | ICD-10-CM | POA: Diagnosis not present

## 2014-02-25 DIAGNOSIS — D649 Anemia, unspecified: Secondary | ICD-10-CM | POA: Diagnosis not present

## 2014-03-07 DIAGNOSIS — D518 Other vitamin B12 deficiency anemias: Secondary | ICD-10-CM | POA: Diagnosis not present

## 2014-03-07 DIAGNOSIS — D508 Other iron deficiency anemias: Secondary | ICD-10-CM | POA: Diagnosis not present

## 2014-03-13 DIAGNOSIS — Z23 Encounter for immunization: Secondary | ICD-10-CM | POA: Diagnosis not present

## 2014-03-20 ENCOUNTER — Encounter (INDEPENDENT_AMBULATORY_CARE_PROVIDER_SITE_OTHER): Payer: Self-pay | Admitting: *Deleted

## 2014-04-16 ENCOUNTER — Encounter (INDEPENDENT_AMBULATORY_CARE_PROVIDER_SITE_OTHER): Payer: Self-pay | Admitting: Internal Medicine

## 2014-04-16 ENCOUNTER — Ambulatory Visit (INDEPENDENT_AMBULATORY_CARE_PROVIDER_SITE_OTHER): Payer: Medicare Other | Admitting: Internal Medicine

## 2014-04-16 VITALS — BP 138/52 | HR 72 | Temp 97.3°F | Ht 66.0 in | Wt 134.9 lb

## 2014-04-16 DIAGNOSIS — D509 Iron deficiency anemia, unspecified: Secondary | ICD-10-CM | POA: Diagnosis not present

## 2014-04-16 DIAGNOSIS — B029 Zoster without complications: Secondary | ICD-10-CM | POA: Diagnosis not present

## 2014-04-16 DIAGNOSIS — D649 Anemia, unspecified: Secondary | ICD-10-CM

## 2014-04-16 DIAGNOSIS — R531 Weakness: Secondary | ICD-10-CM | POA: Diagnosis not present

## 2014-04-16 DIAGNOSIS — I6523 Occlusion and stenosis of bilateral carotid arteries: Secondary | ICD-10-CM | POA: Diagnosis not present

## 2014-04-16 MED ORDER — VALACYCLOVIR HCL 1 G PO TABS: 1000.0000 mg | ORAL_TABLET | Freq: Three times a day (TID) | ORAL | Status: DC

## 2014-04-16 NOTE — Progress Notes (Signed)
Subjective:    Patient ID: Victoria Short, female    DOB: 29-Jul-1924, 80 y.o.   MRN: [id number]  HPI Referred to our by Dr. Jacquiline Doe. Hx of iron deficiency anemia. She tells me about every 6 weeks she has to have an iron infusion. She has been receiving iron about 2 yrs. Last iron infusion 02/28/2014. She tells me her stools are not dark.   She says she is unsteady on her feet. She has not fell however.  She is intolerant of iron pills, they constipate her.  She underwent a EGD/Colonoscopy in 2013 by Dr. Britta Mccreedy for iron deficiency anemia.  EGD: A hiatal hernia was found. Mild gastritis was found, atrophic.  Colonoscopy: Scattered diverticula were found thru out the colon. Three polyps were found, TC, DC and sigmoid colon, all removed with a snare and sent to pathology. Biopsy: Tubular adenoma and inflammatory polyp.  Appetite is not good. No weight loss. No abdominal pain. Sometimes she will have some pain between her rt ovary and gallbladder. She usually has a BM every 2-3 days. No melena  She also states she has a open lesions to her rt buttock area which she thinks is shingles.   Presented to [facility] 3 weeks ago for rectal bleeding. She says she had a bleeding hemorrhoid and they placed one suture in the hemorrhoid. No lab work drawn.   Received the shingle shot in the 80's in [place]. 03/07/2014  Iron 274, Transferrin 220, TIBC 319, % Saturation 86, Hemoglobin 11.2, Hematocrit 36.4, MCV 82.1, retic ct 6.52, Ferritin 394.7 Slide review: After manuel of the slide there are no interferences for retic reporting from: [employer] bodies, NRBC's, basophilic stipping, macrothrombocytes, megakaryocyte fragments, platelet clumps, or other intracellular organisms. Erythrocyte agglutination, leukocytosis and thrombocytosis.are absent.   Review of Systems     Past Medical History  Diagnosis Date  . Hypertension   . Sleep apnea   . Blood transfusion   . GERD (gastroesophageal reflux disease)    . Pneumonia   . Anemia   . Stress-induced cardiomyopathy 05/2011    a. ?Takotubso cardiomyopathy - distribution was different than that of LAD stenosis.  . Hyperlipidemia   . Coronary artery disease     a. NSTEMI 05/2011 s/p DES to LAD, with ?Takotsubo distribution of cardiomyopathy at that time with cardiogenic shock. b. NSTEMI 06/2011 ?spasm - stable CAD, normal EF. c. Patent LAD stent, nonobst CAD by cath 08/20/12 with normal EF. d. Hx dyspnea with Brilinta.  . Hypertrophic cardiomyopathy     a. echo 08/2012: focal basal septal hypertrophy, mildly elevated gradient 59mmHg across LVOT.  . Iron deficiency     a. Hx iron infusion.  . Nasal polyp   . Carotid artery disease     a. 4-19% RICA, 37-90% LICA 08/4095.  . Cardiogenic shock     a. 05/2011 requiring pressor therapy.  . Mitral regurgitation     Past Surgical History  Procedure Laterality Date  . Appendectomy    . Partial hysterectomy    . Abdominal hysterectomy    . Wisdom tooth extraction    . Cardiac catheterization  05/2011    80% mid LAD, 40% LCX. EF: 35-40%.   . Coronary angioplasty with stent placement  05/2011    LAD: 2.5 X 16 mm Promus DES  . Cardiac catheterization  06/22/2011    patent LAD stent. Normal EF  . Cardiac catheterization  08/2012    Patent LAD stent with normal ejection fraction.  Allergies  Allergen Reactions  . Brilinta [Ticagrelor]     H/o dyspnea with this  . Iodine Nausea And Vomiting  . Other     streptomycin  . Shellfish Allergy     Unsteady gait - No anaphylaxis    Current Outpatient Prescriptions on File Prior to Visit  Medication Sig Dispense Refill  . diphenhydrAMINE (BENADRYL) 25 mg capsule Take 25 mg by mouth as needed.       . metoprolol succinate (TOPROL-XL) 25 MG 24 hr tablet TAKE ONE TABLET BY MOUTH ONCE EVERY DAY  30 tablet  5  . simvastatin (ZOCOR) 40 MG tablet TAKE 1 TABLET (40 MG TOTAL) BY MOUTH AT BEDTIME.  30 tablet  3  . aspirin EC 81 MG tablet Take 81 mg by  mouth daily.       No current facility-administered medications on file prior to visit.        Objective:   Physical Exam  Filed Vitals:   04/16/14 1445  BP: 138/52  Pulse: 72  Temp: 97.3 F (36.3 C)  Height: 5\' 6"  (1.676 m)  Weight: 134 lb 14.4 oz (61.19 kg)   Alert and oriented. Skin warm and dry. Oral mucosa is moist.   . Sclera anicteric, conjunctivae is pink. Thyroid not enlarged. No cervical lymphadenopathy. Lungs clear. Heart regular rate and rhythm.  Abdomen is soft. Bowel sounds are positive. No hepatomegaly. No abdominal masses felt. No tenderness.  No edema to lower extremities. Multiple open dry lesions to rt buttock which appear to be shingles.  She denies any pain. Stool brown and guaiac negative.        Assessment & Plan:  Anemia. Receives iron transfusion about every 6 weeks. Last transfusion 8.28/2015. Will get iron studies today to see if they have dropped since her previous transfusion in August..  OV in 1 month I discussed with Dr. Laural Golden. Valtrex 1gm TID x 7 day for her shingles.

## 2014-04-16 NOTE — Patient Instructions (Addendum)
Iron studies. Further recommendations to follow. OV in 1 month

## 2014-04-17 LAB — CBC WITH DIFFERENTIAL/PLATELET
Basophils Absolute: 0 10*3/uL (ref 0.0–0.1)
Basophils Relative: 0 % (ref 0–1)
Eosinophils Absolute: 0.2 10*3/uL (ref 0.0–0.7)
Eosinophils Relative: 3 % (ref 0–5)
HCT: 44.5 % (ref 36.0–46.0)
Hemoglobin: 14.5 g/dL (ref 12.0–15.0)
LYMPHS ABS: 1.7 10*3/uL (ref 0.7–4.0)
LYMPHS PCT: 32 % (ref 12–46)
MCH: 29.4 pg (ref 26.0–34.0)
MCHC: 32.6 g/dL (ref 30.0–36.0)
MCV: 90.1 fL (ref 78.0–100.0)
Monocytes Absolute: 0.4 10*3/uL (ref 0.1–1.0)
Monocytes Relative: 8 % (ref 3–12)
NEUTROS ABS: 3.1 10*3/uL (ref 1.7–7.7)
NEUTROS PCT: 57 % (ref 43–77)
PLATELETS: 208 10*3/uL (ref 150–400)
RBC: 4.94 MIL/uL (ref 3.87–5.11)
WBC: 5.4 10*3/uL (ref 4.0–10.5)

## 2014-04-17 LAB — IRON AND TIBC
%SAT: 50 % (ref 20–55)
IRON: 133 ug/dL (ref 42–145)
TIBC: 265 ug/dL (ref 250–470)
UIBC: 132 ug/dL (ref 125–400)

## 2014-04-17 LAB — FERRITIN: FERRITIN: 45 ng/mL (ref 10–291)

## 2014-04-24 ENCOUNTER — Telehealth (INDEPENDENT_AMBULATORY_CARE_PROVIDER_SITE_OTHER): Payer: Self-pay | Admitting: *Deleted

## 2014-04-24 NOTE — Telephone Encounter (Signed)
addressed

## 2014-04-24 NOTE — Telephone Encounter (Signed)
Victoria Short is returning Terri's call. Her return phone number is [phone].

## 2014-04-30 DIAGNOSIS — D500 Iron deficiency anemia secondary to blood loss (chronic): Secondary | ICD-10-CM | POA: Diagnosis not present

## 2014-04-30 DIAGNOSIS — E039 Hypothyroidism, unspecified: Secondary | ICD-10-CM | POA: Diagnosis not present

## 2014-04-30 DIAGNOSIS — D509 Iron deficiency anemia, unspecified: Secondary | ICD-10-CM | POA: Diagnosis not present

## 2014-05-04 DIAGNOSIS — Z8719 Personal history of other diseases of the digestive system: Secondary | ICD-10-CM

## 2014-05-04 HISTORY — DX: Personal history of other diseases of the digestive system: Z87.19

## 2014-05-05 ENCOUNTER — Telehealth (INDEPENDENT_AMBULATORY_CARE_PROVIDER_SITE_OTHER): Payer: Self-pay | Admitting: *Deleted

## 2014-05-05 NOTE — Telephone Encounter (Signed)
   Diagnosis:    Result(s)   Card 1: Positive     Card 2: Positive   Card 3: Positive    Completed by:  Dillan Candela,LPN  HEMOCCULT SENSA DEVELOPER: LOT#:  [id number] EXPIRATION DATE: 2016-05   HEMOCCULT SENSA CARD:  LOT#:  [id number] EXPIRATION DATE: 10/2014   CARD CONTROL RESULTS:  POSITIVE: Positive NEGATIVE: Negative    ADDITIONAL COMMENTS:

## 2014-05-06 NOTE — Telephone Encounter (Signed)
Message left again at home

## 2014-05-06 NOTE — Telephone Encounter (Signed)
Results given to patient.   Ann, Needs a Given's capsule study

## 2014-05-06 NOTE — Telephone Encounter (Signed)
Message left at home .needs Given Capsule.

## 2014-05-07 ENCOUNTER — Other Ambulatory Visit (INDEPENDENT_AMBULATORY_CARE_PROVIDER_SITE_OTHER): Payer: Self-pay | Admitting: *Deleted

## 2014-05-07 DIAGNOSIS — R195 Other fecal abnormalities: Secondary | ICD-10-CM

## 2014-05-07 DIAGNOSIS — D509 Iron deficiency anemia, unspecified: Secondary | ICD-10-CM

## 2014-05-07 NOTE — Telephone Encounter (Signed)
GIVENS sch'd 07/12/13 @ 730 (700), npo after 8 pm evening before, stop asa 2 days before, left detailed message for patient

## 2014-05-12 ENCOUNTER — Ambulatory Visit (HOSPITAL_COMMUNITY)
Admission: RE | Admit: 2014-05-12 | Discharge: 2014-05-12 | Disposition: A | Discharge status: Home / Self Care | Payer: Medicare Other | Source: Ambulatory Visit | Attending: Internal Medicine | Admitting: Internal Medicine

## 2014-05-12 ENCOUNTER — Encounter (HOSPITAL_COMMUNITY)
Admission: RE | Disposition: A | Discharge status: Home / Self Care | Payer: Self-pay | Source: Ambulatory Visit | Attending: Internal Medicine

## 2014-05-12 DIAGNOSIS — D509 Iron deficiency anemia, unspecified: Secondary | ICD-10-CM | POA: Insufficient documentation

## 2014-05-12 DIAGNOSIS — E785 Hyperlipidemia, unspecified: Secondary | ICD-10-CM | POA: Insufficient documentation

## 2014-05-12 DIAGNOSIS — K31819 Angiodysplasia of stomach and duodenum without bleeding: Secondary | ICD-10-CM

## 2014-05-12 DIAGNOSIS — K2211 Ulcer of esophagus with bleeding: Secondary | ICD-10-CM

## 2014-05-12 DIAGNOSIS — K219 Gastro-esophageal reflux disease without esophagitis: Secondary | ICD-10-CM | POA: Insufficient documentation

## 2014-05-12 DIAGNOSIS — Q2733 Arteriovenous malformation of digestive system vessel: Secondary | ICD-10-CM | POA: Diagnosis not present

## 2014-05-12 DIAGNOSIS — K922 Gastrointestinal hemorrhage, unspecified: Secondary | ICD-10-CM

## 2014-05-12 DIAGNOSIS — I10 Essential (primary) hypertension: Secondary | ICD-10-CM | POA: Insufficient documentation

## 2014-05-12 DIAGNOSIS — I2510 Atherosclerotic heart disease of native coronary artery without angina pectoris: Secondary | ICD-10-CM | POA: Diagnosis not present

## 2014-05-12 DIAGNOSIS — Z87891 Personal history of nicotine dependence: Secondary | ICD-10-CM | POA: Insufficient documentation

## 2014-05-12 DIAGNOSIS — I422 Other hypertrophic cardiomyopathy: Secondary | ICD-10-CM | POA: Insufficient documentation

## 2014-05-12 HISTORY — PX: GIVENS CAPSULE STUDY: SHX5432

## 2014-05-12 SURGERY — IMAGING PROCEDURE, GI TRACT, INTRALUMINAL, VIA CAPSULE

## 2014-05-12 NOTE — H&P (Signed)
Victoria Short is an 80 y.o. female.   Chief Complaint:  Victoria Short is here for small bowel given capsule study. HPI: Victoria Short is 81 year old Caucasian female with multiple medical problems including coronary artery disease and cardiomyopathy who has iron deficiency anemia with history of transfusion and now needing frequent iron infusion. He has undergone EGD and colonoscopy by Dr. Britta Mccreedy in [facility] and no bleeding source identified. There was no history of melena or rectal bleeding. We checked Hemoccults and all 3 are positive. Victoria Short is presently on low-dose aspirin. In the past she has been on anticoagulants. She is therefore undergoing further evaluation with small bowel given capsule study.  Past Medical History  Diagnosis Date  . Hypertension   . Sleep apnea   . Blood transfusion   . GERD (gastroesophageal reflux disease)   . Pneumonia   . Anemia   . Stress-induced cardiomyopathy 05/2011    a. ?Takotubso cardiomyopathy - distribution was different than that of LAD stenosis.  . Hyperlipidemia   . Coronary artery disease     a. NSTEMI 05/2011 s/p DES to LAD, with ?Takotsubo distribution of cardiomyopathy at that time with cardiogenic shock. b. NSTEMI 06/2011 ?spasm - stable CAD, normal EF. c. Patent LAD stent, nonobst CAD by cath 08/20/12 with normal EF. d. Hx dyspnea with Brilinta.  . Hypertrophic cardiomyopathy     a. echo 08/2012: focal basal septal hypertrophy, mildly elevated gradient 46mmHg across LVOT.  . Iron deficiency     a. Hx iron infusion.  . Nasal polyp   . Carotid artery disease     a. 5-46% RICA, 27-03% LICA 11/91.  . Cardiogenic shock     a. 05/2011 requiring pressor therapy.  . Mitral regurgitation     Past Surgical History  Procedure Laterality Date  . Appendectomy    . Partial hysterectomy    . Abdominal hysterectomy    . Wisdom tooth extraction    . Cardiac catheterization  05/2011    80% mid LAD, 40% LCX. EF: 35-40%.   . Coronary angioplasty  with stent placement  05/2011    LAD: 2.5 X 16 mm Promus DES  . Cardiac catheterization  06/22/2011    patent LAD stent. Normal EF  . Cardiac catheterization  08/2012    Patent LAD stent with normal ejection fraction.    Family History  Problem Relation Age of Onset  . Heart attack Mother 39  . Diabetic kidney disease Father 52  . Prostate cancer Father    Social History:  reports that she quit smoking about 35 years ago. Her smoking use included Cigarettes. She has a 18 pack-year smoking history. She has never used smokeless tobacco. She reports that she does not drink alcohol or use illicit drugs.  Allergies:  Allergies  Allergen Reactions  . Brilinta [Ticagrelor]     H/o dyspnea with this  . Iodine Nausea And Vomiting  . Other     streptomycin  . Shellfish Allergy     Unsteady gait - No anaphylaxis    No prescriptions prior to admission    No results found for this or any previous visit (from the past 48 hour(s)). No results found.  ROS  Height 5\' 4"  (1.626 m), weight 130 lb (58.968 kg). Physical Exam   Assessment/Plan Iron deficiency anemia secondary to occult GI bleed negative EGD and colonoscopy. Victoria Short suspected to be losing blood from small boweland therefore undergoing small bowel given capsule study.  Story Conti U 05/12/2014, 2:03 PM

## 2014-05-14 ENCOUNTER — Encounter (HOSPITAL_COMMUNITY): Payer: Self-pay | Admitting: Internal Medicine

## 2014-05-15 NOTE — Op Note (Signed)
Small Bowel Givens Capsule Study Procedure date:  05/12/2014  Referring Provider:  Audree Camel, MD PCP:  Dr. Curlene Labrum, MD Procedure date; 05/12/2014.  Indication for procedure:   Patient is 80 year old Caucasian female with recurrent iron deficiency anemia and need for frequent iron infusions. Previously she has received blood transfusion. Initial evaluation was in July 2013 by Dr. Doristine Mango of either [facility] when she had EGD and colonoscopy. EGD revealed mild gastritis and hiatal hernia and colonoscopy revealed pan colonic diverticulosis and 3 small polyps were removed and only one was tubular adenoma. Patient has not experienced melena or rectal bleeding but we did 3 Hemoccults and they're all positive. Patient is on low-dose aspirin.  Findings:   Patient was able to swallow Given capsule without any difficulty. Scattered erosions noted in the stomach and at least two  covered with blood. 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 Normal mucosa of small bowel. Normal appearing mucosa of ileocecal valve with polyploid appearance   First Gastric image:  34 seconds. First Duodenal image: 24 min and 30 sec First Ileo-Cecal Valve image: 1 hr 39 min and 21 sec First Cecal image: 1 hr 40 min and 18 sec Gastric Passage time: 23 min and 56 sec Small Bowel Passage time:  1 hr and 26min  Summary & Recommendations:  Scattered gastric erosions and two covered with blood. 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 Rapid transit of capsule through small bowel but mucosa is normal. Suspect chronic blood loss from upper GI tract. Consider EGD with therapeutic intervention. Findings have been reviewed with patient over the phone and will be further discussed on her next office visit on 05/19/2014.

## 2014-05-19 ENCOUNTER — Ambulatory Visit (INDEPENDENT_AMBULATORY_CARE_PROVIDER_SITE_OTHER): Payer: Medicare Other | Admitting: Internal Medicine

## 2014-05-19 ENCOUNTER — Encounter (INDEPENDENT_AMBULATORY_CARE_PROVIDER_SITE_OTHER): Payer: Self-pay | Admitting: *Deleted

## 2014-05-19 ENCOUNTER — Other Ambulatory Visit (INDEPENDENT_AMBULATORY_CARE_PROVIDER_SITE_OTHER): Payer: Self-pay | Admitting: *Deleted

## 2014-05-19 ENCOUNTER — Encounter (INDEPENDENT_AMBULATORY_CARE_PROVIDER_SITE_OTHER): Payer: Self-pay | Admitting: Internal Medicine

## 2014-05-19 VITALS — BP 138/76 | HR 72 | Temp 98.2°F | Ht 65.0 in | Wt 133.5 lb

## 2014-05-19 DIAGNOSIS — D509 Iron deficiency anemia, unspecified: Secondary | ICD-10-CM

## 2014-05-19 DIAGNOSIS — K259 Gastric ulcer, unspecified as acute or chronic, without hemorrhage or perforation: Secondary | ICD-10-CM | POA: Insufficient documentation

## 2014-05-19 DIAGNOSIS — K31819 Angiodysplasia of stomach and duodenum without bleeding: Secondary | ICD-10-CM

## 2014-05-19 DIAGNOSIS — K257 Chronic gastric ulcer without hemorrhage or perforation: Secondary | ICD-10-CM | POA: Diagnosis not present

## 2014-05-19 DIAGNOSIS — I6523 Occlusion and stenosis of bilateral carotid arteries: Secondary | ICD-10-CM

## 2014-05-19 NOTE — Patient Instructions (Signed)
EGD with APC therapy 

## 2014-05-19 NOTE — Progress Notes (Addendum)
Subjective:    Patient ID: Victoria Short, female    DOB: March 22, 1925, 80 y.o.   MRN: [id number]  HPI     Patient ID: Victoria Short, female DOB: [birth year], 80 y.o. MRN: [id number]  HPI  Here today for f/u after undergoning Givens Capsule study. Hx of iron deficiency anemia and need for frequent iron infusion. Her initial evaluation was by Dr. Doristine Mango in [facility] where she underwent and EGD and colonoscopy. (See below).  She tells me she is doing good, though she is extremely tired. Appetite is fair. No weight loss. She has actually gained 3 pounds. She is having a BM daily. Her stools are light brown in color.  CBC    Component Value Date/Time   WBC 5.4 04/16/2014 1530   RBC 4.94 04/16/2014 1530   HGB 14.5 04/16/2014 1530   HCT 44.5 04/16/2014 1530   PLT 208 04/16/2014 1530   MCV 90.1 04/16/2014 1530   MCH 29.4 04/16/2014 1530   MCHC 32.6 04/16/2014 1530   RDW 13.3 06/22/2011 1242   LYMPHSABS 1.7 04/16/2014 1530   MONOABS 0.4 04/16/2014 1530   EOSABS 0.2 04/16/2014 1530   BASOSABS 0.0 04/16/2014 1530              Filed: 05/15/2014 8:59 AM Note Time: 05/15/2014 12:21 AM Status: Signed   Editor: Rogene Houston, MD (Physician)     Expand All Collapse All  Small Bowel Givens Capsule Study Procedure date: 05/12/2014  Referring Provider: Audree Camel, MD PCP: Dr. Curlene Labrum, MD Procedure date; 05/12/2014.  Indication for procedure: Patient is 80 year old Caucasian female with recurrent iron deficiency anemia and need for frequent iron infusions. Previously she has received blood transfusion. Initial evaluation was in July 2013 by Dr. Doristine Mango of either [facility] when she had EGD and colonoscopy. EGD revealed mild gastritis and hiatal hernia and colonoscopy revealed pan colonic diverticulosis and 3 small polyps were removed and only one was tubular adenoma. Patient has not experienced melena or rectal  bleeding but we did 3 Hemoccults and they're all positive. Patient is on low-dose aspirin.  Findings:  Patient was able to swallow Given capsule without any difficulty. Scattered erosions noted in the stomach and at least two covered with blood. 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 Normal mucosa of small bowel. Normal appearing mucosa of ileocecal valve with polyploid appearance         Review of Systems Past Medical History  Diagnosis Date  . Hypertension   . Sleep apnea   . Blood transfusion   . GERD (gastroesophageal reflux disease)   . Pneumonia   . Anemia   . Stress-induced cardiomyopathy 05/2011    a. ?Takotubso cardiomyopathy - distribution was different than that of LAD stenosis.  . Hyperlipidemia   . Coronary artery disease     a. NSTEMI 05/2011 s/p DES to LAD, with ?Takotsubo distribution of cardiomyopathy at that time with cardiogenic shock. b. NSTEMI 06/2011 ?spasm - stable CAD, normal EF. c. Patent LAD stent, nonobst CAD by cath 08/20/12 with normal EF. d. Hx dyspnea with Brilinta.  . Hypertrophic cardiomyopathy     a. echo 08/2012: focal basal septal hypertrophy, mildly elevated gradient 49mmHg across LVOT.  . Iron deficiency     a. Hx iron infusion.  . Nasal polyp   . Carotid artery disease     a. 9-73% RICA, 53-29% LICA 03/2425.  . Cardiogenic shock     a. 05/2011 requiring pressor therapy.  Marland Kitchen  Mitral regurgitation     Past Surgical History  Procedure Laterality Date  . Appendectomy    . Partial hysterectomy    . Abdominal hysterectomy    . Wisdom tooth extraction    . Cardiac catheterization  05/2011    80% mid LAD, 40% LCX. EF: 35-40%.   . Coronary angioplasty with stent placement  05/2011    LAD: 2.5 X 16 mm Promus DES  . Cardiac catheterization  06/22/2011    patent LAD stent. Normal EF  . Cardiac catheterization  08/2012    Patent LAD stent with normal ejection fraction.  [name] capsule study N/A 05/12/2014    Procedure: GIVENS  CAPSULE STUDY;  Surgeon: Rogene Houston, MD;  Location: [facility] END[facility] SUITE;  Service: Endoscopy;  Laterality: N/A;  730    Allergies  Allergen Reactions  . Brilinta [Ticagrelor]     H/o dyspnea with this  . Iodine Nausea And Vomiting  . Other     streptomycin  . Shellfish Allergy     Unsteady gait - No anaphylaxis    Current Outpatient Prescriptions on File Prior to Visit  Medication Sig Dispense Refill  . aspirin EC 81 MG tablet Take 81 mg by mouth daily.    . diphenhydrAMINE (BENADRYL) 25 mg capsule Take 25 mg by mouth as needed.     . hydrocortisone (ANUSOL-HC) 2.5 % rectal cream Place 1 application rectally as needed for hemorrhoids or itching.    . metoprolol succinate (TOPROL-XL) 25 MG 24 hr tablet TAKE ONE TABLET BY MOUTH ONCE EVERY DAY 30 tablet 5  . simvastatin (ZOCOR) 40 MG tablet TAKE 1 TABLET (40 MG TOTAL) BY MOUTH AT BEDTIME. 30 tablet 3   No current facility-administered medications on file prior to visit.        Objective:   Physical Exam  Filed Vitals:   05/19/14 0929  Height: 5\' 5"  (1.651 m)  Weight: 133 lb 8 oz (60.555 kg)  Alert and oriented. Skin warm and dry. Oral mucosa is moist.   . Sclera anicteric, conjunctivae is pink. Thyroid not enlarged. No cervical lymphadenopathy. Lungs clear. Heart regular rate and rhythm. Murmur heard Abdomen is soft. Bowel sounds are positive. No hepatomegaly. No abdominal masses felt. No tenderness.  No edema to lower extremities.         Assessment & Plan:  Iron deficiency with abnormal Given's capsule. I discussed with Dr Laural Golden. EGD with [facility].

## 2014-05-22 DIAGNOSIS — L570 Actinic keratosis: Secondary | ICD-10-CM | POA: Diagnosis not present

## 2014-05-22 DIAGNOSIS — C44519 Basal cell carcinoma of skin of other part of trunk: Secondary | ICD-10-CM | POA: Diagnosis not present

## 2014-05-23 ENCOUNTER — Encounter (HOSPITAL_COMMUNITY): Payer: Self-pay | Admitting: *Deleted

## 2014-05-23 ENCOUNTER — Ambulatory Visit (HOSPITAL_COMMUNITY)
Admission: RE | Admit: 2014-05-23 | Discharge: 2014-05-23 | Disposition: A | Discharge status: Home / Self Care | Payer: Medicare Other | Source: Ambulatory Visit | Attending: Internal Medicine | Admitting: Internal Medicine

## 2014-05-23 ENCOUNTER — Encounter (HOSPITAL_COMMUNITY)
Admission: RE | Disposition: A | Discharge status: Home / Self Care | Payer: Self-pay | Source: Ambulatory Visit | Attending: Internal Medicine

## 2014-05-23 DIAGNOSIS — K31819 Angiodysplasia of stomach and duodenum without bleeding: Secondary | ICD-10-CM

## 2014-05-23 DIAGNOSIS — E785 Hyperlipidemia, unspecified: Secondary | ICD-10-CM | POA: Diagnosis not present

## 2014-05-23 DIAGNOSIS — K2900 Acute gastritis without bleeding: Secondary | ICD-10-CM | POA: Diagnosis not present

## 2014-05-23 DIAGNOSIS — Z87891 Personal history of nicotine dependence: Secondary | ICD-10-CM | POA: Insufficient documentation

## 2014-05-23 DIAGNOSIS — K254 Chronic or unspecified gastric ulcer with hemorrhage: Secondary | ICD-10-CM | POA: Insufficient documentation

## 2014-05-23 DIAGNOSIS — K259 Gastric ulcer, unspecified as acute or chronic, without hemorrhage or perforation: Secondary | ICD-10-CM

## 2014-05-23 DIAGNOSIS — Z8042 Family history of malignant neoplasm of prostate: Secondary | ICD-10-CM | POA: Insufficient documentation

## 2014-05-23 DIAGNOSIS — B9681 Helicobacter pylori [H. pylori] as the cause of diseases classified elsewhere: Secondary | ICD-10-CM | POA: Diagnosis not present

## 2014-05-23 DIAGNOSIS — K219 Gastro-esophageal reflux disease without esophagitis: Secondary | ICD-10-CM | POA: Diagnosis not present

## 2014-05-23 DIAGNOSIS — D509 Iron deficiency anemia, unspecified: Secondary | ICD-10-CM | POA: Insufficient documentation

## 2014-05-23 DIAGNOSIS — Q2733 Arteriovenous malformation of digestive system vessel: Secondary | ICD-10-CM | POA: Insufficient documentation

## 2014-05-23 DIAGNOSIS — Z7982 Long term (current) use of aspirin: Secondary | ICD-10-CM | POA: Diagnosis not present

## 2014-05-23 DIAGNOSIS — D132 Benign neoplasm of duodenum: Secondary | ICD-10-CM | POA: Insufficient documentation

## 2014-05-23 DIAGNOSIS — K921 Melena: Secondary | ICD-10-CM | POA: Diagnosis not present

## 2014-05-23 DIAGNOSIS — K449 Diaphragmatic hernia without obstruction or gangrene: Secondary | ICD-10-CM | POA: Insufficient documentation

## 2014-05-23 DIAGNOSIS — K2960 Other gastritis without bleeding: Secondary | ICD-10-CM | POA: Diagnosis not present

## 2014-05-23 DIAGNOSIS — Z79899 Other long term (current) drug therapy: Secondary | ICD-10-CM | POA: Diagnosis not present

## 2014-05-23 DIAGNOSIS — G4730 Sleep apnea, unspecified: Secondary | ICD-10-CM | POA: Insufficient documentation

## 2014-05-23 DIAGNOSIS — K317 Polyp of stomach and duodenum: Secondary | ICD-10-CM | POA: Diagnosis not present

## 2014-05-23 DIAGNOSIS — I10 Essential (primary) hypertension: Secondary | ICD-10-CM | POA: Diagnosis not present

## 2014-05-23 HISTORY — PX: ESOPHAGOGASTRODUODENOSCOPY: SHX5428

## 2014-05-23 SURGERY — EGD (ESOPHAGOGASTRODUODENOSCOPY)
Anesthesia: Moderate Sedation

## 2014-05-23 MED ORDER — MIDAZOLAM HCL 5 MG/5ML IJ SOLN
INTRAMUSCULAR | Status: DC | PRN
  Administered 2014-05-23 (×2): 1 mg via INTRAVENOUS

## 2014-05-23 MED ORDER — MEPERIDINE HCL 50 MG/ML IJ SOLN
INTRAMUSCULAR | Status: AC
  Filled 2014-05-23: qty 1

## 2014-05-23 MED ORDER — MEPERIDINE HCL 50 MG/ML IJ SOLN
INTRAMUSCULAR | Status: DC | PRN
  Administered 2014-05-23: 15 mg via INTRAVENOUS
  Administered 2014-05-23: 20 mg via INTRAVENOUS

## 2014-05-23 MED ORDER — SODIUM CHLORIDE 0.9 % IV SOLN
INTRAVENOUS | Status: DC
  Administered 2014-05-23: 12:00:00 via INTRAVENOUS

## 2014-05-23 MED ORDER — BUTAMBEN-TETRACAINE-BENZOCAINE 2-2-14 % EX AERO
INHALATION_SPRAY | CUTANEOUS | Status: DC | PRN
  Administered 2014-05-23: 2 via TOPICAL

## 2014-05-23 MED ORDER — MIDAZOLAM HCL 5 MG/5ML IJ SOLN
INTRAMUSCULAR | Status: AC
  Filled 2014-05-23: qty 10

## 2014-05-23 MED ORDER — PANTOPRAZOLE SODIUM 40 MG PO TBEC: 40.0000 mg | DELAYED_RELEASE_TABLET | Freq: Every day | ORAL | Status: DC

## 2014-05-23 NOTE — H&P (Signed)
Victoria Short is an 80 y.o. female.   Chief Complaint:  Patient is here for EGD. HPI: Patient is 80 year old Caucasian female with history of iron deficiency anemia and was initially evaluated in 2013 by Dr. Doristine Mango off [facility]. She had EGD revealing hiatal hernia and atrophic gastritis. Colonoscopy revealed pancolonic diverticulosis and 3 small polyps and one was tubular adenoma. Patient received blood transfusion on one occasion and she is requiring frequent parenteral iron infusions. She was sent over for small bowel Given study by Dr. Jacquiline Doe. She underwent small bowel given capsule study which revealed gastric erosions covered with specks of fresh blood and gastric AVM. Therefore EGD was recommended. She has constipation and uses an enema twice a week. She denies melena or rectal bleeding. She is on low-dose aspirin but does not take other NSAIDs. We did 3 Hemoccults and all were positive. She reports pain across upper abdomen and right mid abdomen which started about 2-3 weeks ago. She describes as mild pain. She has not found any triggers or  alleviating situations.  Past Medical History  Diagnosis Date  . Hypertension   . Sleep apnea   . Blood transfusion   . GERD (gastroesophageal reflux disease)   . Pneumonia   . Anemia   . Stress-induced cardiomyopathy 05/2011    a. ?Takotubso cardiomyopathy - distribution was different than that of LAD stenosis.  . Hyperlipidemia   . Coronary artery disease     a. NSTEMI 05/2011 s/p DES to LAD, with ?Takotsubo distribution of cardiomyopathy at that time with cardiogenic shock. b. NSTEMI 06/2011 ?spasm - stable CAD, normal EF. c. Patent LAD stent, nonobst CAD by cath 08/20/12 with normal EF. d. Hx dyspnea with Brilinta.  . Hypertrophic cardiomyopathy     a. echo 08/2012: focal basal septal hypertrophy, mildly elevated gradient 88mmHg across LVOT.  . Iron deficiency     a. Hx iron infusion.  . Nasal polyp   . Carotid artery  disease     a. 5-02% RICA, 77-41% LICA 08/8784.  . Cardiogenic shock     a. 05/2011 requiring pressor therapy.  . Mitral regurgitation     Past Surgical History  Procedure Laterality Date  . Appendectomy    . Partial hysterectomy    . Abdominal hysterectomy    . Wisdom tooth extraction    . Cardiac catheterization  05/2011    80% mid LAD, 40% LCX. EF: 35-40%.   . Coronary angioplasty with stent placement  05/2011    LAD: 2.5 X 16 mm Promus DES  . Cardiac catheterization  06/22/2011    patent LAD stent. Normal EF  . Cardiac catheterization  08/2012    Patent LAD stent with normal ejection fraction.  [name] capsule study N/A 05/12/2014    Procedure: GIVENS CAPSULE STUDY;  Surgeon: Rogene Houston, MD;  [facility]: [facility];  Service: Endoscopy;  Laterality: N/A;  730    Family History  Problem Relation Age of Onset  . Heart attack Mother 5  . Diabetic kidney disease Father 18  . Prostate cancer Father    Social History:  reports that she quit smoking about 35 years ago. Her smoking use included Cigarettes. She has a 18 pack-year smoking history. She has never used smokeless tobacco. She reports that she does not drink alcohol or use illicit drugs.  Allergies:  Allergies  Allergen Reactions  . Brilinta [Ticagrelor]     H/o dyspnea with this  . Iodine Nausea And Vomiting  .  Other     streptomycin  . Shellfish Allergy     Unsteady gait - No anaphylaxis    Medications Prior to Admission  Medication Sig Dispense Refill  . aspirin EC 81 MG tablet Take 81 mg by mouth daily.    . diphenhydrAMINE (BENADRYL) 25 mg capsule Take 25 mg by mouth as needed.     . metoprolol succinate (TOPROL-XL) 25 MG 24 hr tablet TAKE ONE TABLET BY MOUTH ONCE EVERY DAY 30 tablet 5  . simvastatin (ZOCOR) 40 MG tablet TAKE 1 TABLET (40 MG TOTAL) BY MOUTH AT BEDTIME. 30 tablet 3  . hydrocortisone (ANUSOL-HC) 2.5 % rectal cream Place 1 application rectally as needed for hemorrhoids or itching.       No results found for this or any previous visit (from the past 48 hour(s)). No results found.  ROS  Blood pressure 184/73, pulse 67, temperature 97.6 F (36.4 C), temperature source Oral, resp. rate 17, SpO2 99 %. Physical Exam   Assessment/Plan History of iron deficiency anemia secondary to chronic occult GI bleed and negative workup 2 years ago. Abnormal small bowel given capsule study revealing gastric erosions covered with specks of blood and AVM. EGD with therapeutic intention.  Victoria Short U 05/23/2014, 1:00 PM

## 2014-05-23 NOTE — Op Note (Signed)
EGD PROCEDURE REPORT  PATIENT:  Victoria Short  MR#:  [id number] Birthdate:  Aug 22, 1924, 80 y.o., female Endoscopist:  Dr. Rogene Houston, MD Referred By:  Dr. Rayne Du ref. provider found Procedure Date: 05/23/2014  Procedure:   EGD  Indications:  Patient is 80 year old Caucasian female with history of iron deficiency anemia secondary to chronic occult GI bleed. Initial workup was 2 years ago at [facility] and no bleeding lesion found on EGD and colonoscopy. Recent given capsule study revealed gastric erosions covered with specks of fresh blood and gastric AVM. She is therefore returning for EGD with therapeutic intervention.            Informed Consent:  The risks, benefits, alternatives & imponderables which include, but are not limited to, bleeding, infection, perforation, drug reaction and potential missed lesion have been reviewed.  The potential for biopsy, lesion removal, esophageal dilation, etc. have also been discussed.  Questions have been answered.  All parties agreeable.  Please see history & physical in medical record for more information.  Medications:  Demerol  35 mg IV Versed 2 mg IV Cetacaine spray topically for oropharyngeal anesthesia  Description of procedure:  The endoscope was introduced through the mouth and advanced to the second portion of the duodenum without difficulty or limitations. The mucosal surfaces were surveyed very carefully during advancement of the scope and upon withdrawal.  Findings:  Esophagus:  Mucosa of the esophagus was normal. Focal prominence to submucosal veins and esophagus but not meeting criterion for varices. GEJ:  35 cm Hiatus:  40 cm. moderate to large sliding hiatal hernia. Edema and erythema noted to folds at the level of hiatus. Stomach:  Stomach was empty and distended very well with insufflation. Folds in the proximal stomach were normal. Scattered erosions noted at gastric body. Antral mucosa was granular and friable but no AV malformations  identified. Duodenum:  Friable bulbar mucosa with noncritical narrowing at angle with diverticulum. Large broad-based duodenal polyp noted and second part of the duodenum with maximal diameter of 30 mm.  Therapeutic/Diagnostic Maneuvers Performed:   Piecemeal polypectomy attempted. 10 mm piece hot snared. Another piece was snared but coagulated during this process. Difficult approach due to hyperperistalsis. Still the residual polyp was coagulated with snare tip. Antral biopsy taken for routine histology.  Complications:  None  Impression: Large sliding hiatal hernia with focal gastritis at the level of hiatus. Erosive gastritis. Biopsy taken. Duodenitis with post bulbar noncritical narrowing. Large broad-based polyp involving second part of the duodenum. Piecemeal polypectomy attempted. 10 mm piece snared and most of the residual polyp was coagulated with snare tip. Polypectomy incomplete.  Comment; Suspect chronic blood loss from upper GI tract.  Recommendations:  No aspirin or NSAIDs for 10 days. Pantoprazole 40 mg by mouth every morning. I will be contacting patient with biopsy results and further recommendations.  Brindy Higginbotham U  05/23/2014  1:46 PM  CC: Dr. Curlene Labrum, MD & Dr. Rayne Du ref. provider found

## 2014-05-23 NOTE — Discharge Instructions (Signed)
No aspirin or NSAIDs for 10 days. Resume other medications as before. Pantoprazole 40 mg by mouth 30 minutes before breakfast daily. No driving for 24 hours. Physician will call with biopsy results.  Esophagogastroduodenoscopy Care After Refer to this sheet in the next few weeks. These instructions provide you with information on caring for yourself after your procedure. Your caregiver may also give you more specific instructions. Your treatment has been planned according to current medical practices, but problems sometimes occur. Call your caregiver if you have any problems or questions after your procedure.  HOME CARE INSTRUCTIONS  Do not eat or drink anything until the numbing medicine (local anesthetic) has worn off and your gag reflex has returned. You will know that the local anesthetic has worn off when you can swallow comfortably.  Do not drive for 12 hours after the procedure or as directed by your caregiver.  Only take medicines as directed by your caregiver. SEEK MEDICAL CARE IF:   You cannot stop coughing.  You are not urinating at all or less than usual. SEEK IMMEDIATE MEDICAL CARE IF:  You have difficulty swallowing.  You cannot eat or drink.  You have worsening throat or chest pain.  You have dizziness, lightheadedness, or you faint.  You have nausea or vomiting.  You have chills.  You have a fever.  You have severe abdominal pain.  You have black, tarry, or bloody stools. Document Released: 06/06/2012 Document Reviewed: 06/06/2012 [facility] Patient Information 2015 [place]. This information is not intended to replace advice given to you by your health care provider. Make sure you discuss any questions you have with your health care provider.

## 2014-05-28 ENCOUNTER — Encounter (HOSPITAL_COMMUNITY): Payer: Self-pay | Admitting: Internal Medicine

## 2014-06-04 ENCOUNTER — Encounter (INDEPENDENT_AMBULATORY_CARE_PROVIDER_SITE_OTHER): Payer: Self-pay | Admitting: *Deleted

## 2014-06-09 ENCOUNTER — Other Ambulatory Visit (INDEPENDENT_AMBULATORY_CARE_PROVIDER_SITE_OTHER): Payer: Self-pay | Admitting: Internal Medicine

## 2014-06-09 MED ORDER — BIS SUBCIT-METRONID-TETRACYC 140-125-125 MG PO CAPS: 3.0000 | ORAL_CAPSULE | Freq: Three times a day (TID) | ORAL | Status: DC

## 2014-06-10 ENCOUNTER — Telehealth (INDEPENDENT_AMBULATORY_CARE_PROVIDER_SITE_OTHER): Payer: Self-pay | Admitting: *Deleted

## 2014-06-10 NOTE — Telephone Encounter (Signed)
Pylera is to expensive. Per Dr.Rehman may takes the following: Clarithromycin 500 mg by mouth twice a day for 10 days. Amoxicillin 500 mg take by mouth twice a day for 10 days, Flagyl (Metronidazole)  Omeprazole 20 mg take by mouth twice a day,after 10 days resume taking daily. This was called to patient's pharmacy/[facility]. Both patient and husband made aware.

## 2014-06-11 ENCOUNTER — Encounter (HOSPITAL_COMMUNITY): Payer: Self-pay | Admitting: Cardiovascular Disease

## 2014-06-12 ENCOUNTER — Encounter (HOSPITAL_COMMUNITY): Payer: Self-pay | Admitting: Cardiovascular Disease

## 2014-06-18 ENCOUNTER — Encounter (INDEPENDENT_AMBULATORY_CARE_PROVIDER_SITE_OTHER): Payer: Self-pay

## 2014-07-02 ENCOUNTER — Telehealth: Payer: Self-pay | Admitting: Cardiovascular Disease

## 2014-07-02 MED ORDER — NITROGLYCERIN 0.4 MG SL SUBL: 0.4000 mg | SUBLINGUAL_TABLET | SUBLINGUAL | Status: DC | PRN

## 2014-07-02 NOTE — Telephone Encounter (Signed)
[facility] in [place]   Patient would like [employer] called in.  She just realized that she is out

## 2014-07-02 NOTE — Telephone Encounter (Signed)
Done

## 2014-07-14 DIAGNOSIS — L814 Other melanin hyperpigmentation: Secondary | ICD-10-CM | POA: Diagnosis not present

## 2014-07-14 DIAGNOSIS — L821 Other seborrheic keratosis: Secondary | ICD-10-CM | POA: Diagnosis not present

## 2014-07-14 DIAGNOSIS — D485 Neoplasm of uncertain behavior of skin: Secondary | ICD-10-CM | POA: Diagnosis not present

## 2014-07-14 DIAGNOSIS — L918 Other hypertrophic disorders of the skin: Secondary | ICD-10-CM | POA: Diagnosis not present

## 2014-07-14 DIAGNOSIS — L570 Actinic keratosis: Secondary | ICD-10-CM | POA: Diagnosis not present

## 2014-07-23 DIAGNOSIS — R1011 Right upper quadrant pain: Secondary | ICD-10-CM | POA: Diagnosis not present

## 2014-07-23 DIAGNOSIS — E782 Mixed hyperlipidemia: Secondary | ICD-10-CM | POA: Diagnosis not present

## 2014-07-23 DIAGNOSIS — I10 Essential (primary) hypertension: Secondary | ICD-10-CM | POA: Diagnosis not present

## 2014-07-23 DIAGNOSIS — B372 Candidiasis of skin and nail: Secondary | ICD-10-CM | POA: Diagnosis not present

## 2014-07-23 DIAGNOSIS — L918 Other hypertrophic disorders of the skin: Secondary | ICD-10-CM | POA: Diagnosis not present

## 2014-07-23 DIAGNOSIS — E559 Vitamin D deficiency, unspecified: Secondary | ICD-10-CM | POA: Diagnosis not present

## 2014-07-23 DIAGNOSIS — E119 Type 2 diabetes mellitus without complications: Secondary | ICD-10-CM | POA: Diagnosis not present

## 2014-07-23 DIAGNOSIS — D649 Anemia, unspecified: Secondary | ICD-10-CM | POA: Diagnosis not present

## 2014-07-30 DIAGNOSIS — E039 Hypothyroidism, unspecified: Secondary | ICD-10-CM | POA: Diagnosis not present

## 2014-07-30 DIAGNOSIS — D500 Iron deficiency anemia secondary to blood loss (chronic): Secondary | ICD-10-CM | POA: Diagnosis not present

## 2014-07-31 DIAGNOSIS — K2970 Gastritis, unspecified, without bleeding: Secondary | ICD-10-CM | POA: Diagnosis not present

## 2014-07-31 DIAGNOSIS — D638 Anemia in other chronic diseases classified elsewhere: Secondary | ICD-10-CM | POA: Diagnosis not present

## 2014-07-31 DIAGNOSIS — E611 Iron deficiency: Secondary | ICD-10-CM | POA: Diagnosis not present

## 2014-07-31 DIAGNOSIS — B9681 Helicobacter pylori [H. pylori] as the cause of diseases classified elsewhere: Secondary | ICD-10-CM | POA: Diagnosis not present

## 2014-08-01 DIAGNOSIS — D7389 Other diseases of spleen: Secondary | ICD-10-CM | POA: Diagnosis not present

## 2014-08-01 DIAGNOSIS — R1031 Right lower quadrant pain: Secondary | ICD-10-CM | POA: Diagnosis not present

## 2014-08-01 DIAGNOSIS — R1011 Right upper quadrant pain: Secondary | ICD-10-CM | POA: Diagnosis not present

## 2014-08-01 DIAGNOSIS — K824 Cholesterolosis of gallbladder: Secondary | ICD-10-CM | POA: Diagnosis not present

## 2014-08-01 DIAGNOSIS — D734 Cyst of spleen: Secondary | ICD-10-CM | POA: Diagnosis not present

## 2014-08-01 DIAGNOSIS — R935 Abnormal findings on diagnostic imaging of other abdominal regions, including retroperitoneum: Secondary | ICD-10-CM | POA: Diagnosis not present

## 2014-08-28 DIAGNOSIS — D239 Other benign neoplasm of skin, unspecified: Secondary | ICD-10-CM | POA: Diagnosis not present

## 2014-11-03 ENCOUNTER — Telehealth (INDEPENDENT_AMBULATORY_CARE_PROVIDER_SITE_OTHER): Payer: Self-pay | Admitting: *Deleted

## 2014-11-03 NOTE — Telephone Encounter (Signed)
Victoria Short has a rash behind her ears, on her toes, no energy and short of breath. The last time she had this, Dr. Laural Golden called in a cream because this was coming from a GI infection. Her return phone number is [phone] or [phone].

## 2014-11-03 NOTE — Telephone Encounter (Signed)
Talked with the patient and she states that she was treated back in December with 5 different meds because the only 1 medication would have cost her over $600.  She currently has a rash under her arm pits, that itched badly. It is behind her ears also, and her toes. She is using over the counter creams. Questions if she needs to see Dr.Rehman or PCP?  She mentions that she has not had lab work or any infusions from cancer center in a while. She believes that it is Iron.  Ask that when we call  To call [phone].

## 2014-11-04 NOTE — Telephone Encounter (Signed)
Patient was called and made aware. 

## 2014-11-04 NOTE — Telephone Encounter (Signed)
Per Dr.Rehman the patient should see her Primary Care Physician.

## 2014-11-11 DIAGNOSIS — E119 Type 2 diabetes mellitus without complications: Secondary | ICD-10-CM | POA: Diagnosis not present

## 2014-11-11 DIAGNOSIS — E559 Vitamin D deficiency, unspecified: Secondary | ICD-10-CM | POA: Diagnosis not present

## 2014-11-11 DIAGNOSIS — L219 Seborrheic dermatitis, unspecified: Secondary | ICD-10-CM | POA: Diagnosis not present

## 2014-11-11 DIAGNOSIS — D509 Iron deficiency anemia, unspecified: Secondary | ICD-10-CM | POA: Diagnosis not present

## 2014-11-11 DIAGNOSIS — E782 Mixed hyperlipidemia: Secondary | ICD-10-CM | POA: Diagnosis not present

## 2014-11-22 DIAGNOSIS — R0602 Shortness of breath: Secondary | ICD-10-CM | POA: Diagnosis not present

## 2014-11-22 DIAGNOSIS — I10 Essential (primary) hypertension: Secondary | ICD-10-CM | POA: Diagnosis not present

## 2014-11-22 DIAGNOSIS — Z7982 Long term (current) use of aspirin: Secondary | ICD-10-CM | POA: Diagnosis not present

## 2014-11-22 DIAGNOSIS — R079 Chest pain, unspecified: Secondary | ICD-10-CM | POA: Diagnosis not present

## 2014-11-22 DIAGNOSIS — K449 Diaphragmatic hernia without obstruction or gangrene: Secondary | ICD-10-CM | POA: Diagnosis not present

## 2014-11-22 DIAGNOSIS — Z79899 Other long term (current) drug therapy: Secondary | ICD-10-CM | POA: Diagnosis not present

## 2014-11-22 DIAGNOSIS — J9811 Atelectasis: Secondary | ICD-10-CM | POA: Diagnosis not present

## 2014-11-22 DIAGNOSIS — Z955 Presence of coronary angioplasty implant and graft: Secondary | ICD-10-CM | POA: Diagnosis not present

## 2014-11-22 DIAGNOSIS — K219 Gastro-esophageal reflux disease without esophagitis: Secondary | ICD-10-CM | POA: Diagnosis not present

## 2014-11-22 DIAGNOSIS — R51 Headache: Secondary | ICD-10-CM | POA: Diagnosis not present

## 2014-11-22 DIAGNOSIS — I252 Old myocardial infarction: Secondary | ICD-10-CM | POA: Diagnosis not present

## 2014-11-22 DIAGNOSIS — Z87891 Personal history of nicotine dependence: Secondary | ICD-10-CM | POA: Diagnosis not present

## 2014-11-25 ENCOUNTER — Encounter: Payer: Self-pay | Admitting: Cardiovascular Disease

## 2014-11-25 ENCOUNTER — Ambulatory Visit (INDEPENDENT_AMBULATORY_CARE_PROVIDER_SITE_OTHER): Payer: Medicare Other | Admitting: Cardiovascular Disease

## 2014-11-25 VITALS — BP 144/70 | HR 78 | Ht 67.0 in | Wt 135.0 lb

## 2014-11-25 DIAGNOSIS — I25118 Atherosclerotic heart disease of native coronary artery with other forms of angina pectoris: Secondary | ICD-10-CM

## 2014-11-25 DIAGNOSIS — E785 Hyperlipidemia, unspecified: Secondary | ICD-10-CM

## 2014-11-25 DIAGNOSIS — I10 Essential (primary) hypertension: Secondary | ICD-10-CM | POA: Diagnosis not present

## 2014-11-25 DIAGNOSIS — I5181 Takotsubo syndrome: Secondary | ICD-10-CM

## 2014-11-25 DIAGNOSIS — I209 Angina pectoris, unspecified: Secondary | ICD-10-CM | POA: Diagnosis not present

## 2014-11-25 DIAGNOSIS — Z9289 Personal history of other medical treatment: Secondary | ICD-10-CM

## 2014-11-25 DIAGNOSIS — K449 Diaphragmatic hernia without obstruction or gangrene: Secondary | ICD-10-CM

## 2014-11-25 DIAGNOSIS — R0602 Shortness of breath: Secondary | ICD-10-CM

## 2014-11-25 DIAGNOSIS — Z87898 Personal history of other specified conditions: Secondary | ICD-10-CM

## 2014-11-25 MED ORDER — NITROGLYCERIN 0.4 MG SL SUBL: 0.4000 mg | SUBLINGUAL_TABLET | SUBLINGUAL | Status: DC | PRN

## 2014-11-25 MED ORDER — ISOSORBIDE MONONITRATE ER 30 MG PO TB24: 30.0000 mg | ORAL_TABLET | Freq: Every day | ORAL | Status: DC

## 2014-11-25 NOTE — Progress Notes (Signed)
Patient ID: Essica Kiker, female   DOB: 1924/07/22, 81 y.o.   MRN: [id number]      SUBJECTIVE: Mrs. Crosby is here for a routine followup visit. She has a history of coronary artery disease status post angioplasty and drug-eluting stent placement to the LAD in November of 2012. She also has history of stress-induced cardiomyopathy. Her ejection fraction subsequently normalized. In 2014, she presented with chest pain similar to her previous myocardial infarction. Interestingly, the chest pain happened after she was upset about waiting too long in a physician's office. She was transferred to [facility] where she underwent cardiac catheterization which showed a patent LAD stent with no evidence of obstructive disease. Ejection fraction was normal.  She was evaluated on 11/22/14 at [facility] for chest pain with radiation into her left shoulder. She had been cooking dinner when the pain began and thought it was similar to her pain prior to percutaneous coronary intervention. She took 2 sublingual nitroglycerin which helped alleviate her pain. Blood pressure was markedly elevated at 177/104.   She reportedly initially refused observation secondary to insurance regions and threatened to sign out AMA. Troponin was normal. The physician wanted to hospitalize her but she refused.   Chest x-ray showed mild bilateral midlung opacities which could reflect a mild infectious process or possibly atelectasis. There was a large hiatal hernia. Basic metabolic panel was reviewed and found to be unremarkable. Troponins were negative 2. White count and hemoglobin were normal. ECG demonstrated normal sinus rhythm with no gross ischemic ST segment or T-wave abnormalities with possible old inferior infarct.  She has epigastric discomfort after eating. She was previously not told about this hiatal hernia.  She has shortness of breath both with and without activity.   Soc: She and her husband, who is also  my patient, moved here from [place] in 2003.    Review of Systems: As per "subjective", otherwise negative.  Allergies  Allergen Reactions  . Brilinta [Ticagrelor]     H/o dyspnea with this  . Iodine Nausea And Vomiting  . Other     streptomycin  . Shellfish Allergy     Unsteady gait - No anaphylaxis    Current Outpatient Prescriptions  Medication Sig Dispense Refill  . bismuth-metronidazole-tetracycline (PYLERA) 140-125-125 MG per capsule Take 3 capsules by mouth 4 (four) times daily -  before meals and at bedtime. 120 capsule 0  . diphenhydrAMINE (BENADRYL) 25 mg capsule Take 25 mg by mouth as needed.     . hydrocortisone (ANUSOL-HC) 2.5 % rectal cream Place 1 application rectally as needed for hemorrhoids or itching.    . metoprolol succinate (TOPROL-XL) 25 MG 24 hr tablet TAKE ONE TABLET BY MOUTH ONCE EVERY DAY 30 tablet 5  . nitroGLYCERIN (NITROSTAT) 0.4 MG SL tablet Place 1 tablet (0.4 mg total) under the tongue every 5 (five) minutes as needed for chest pain. 25 tablet 3  . pantoprazole (PROTONIX) 40 MG tablet Take 1 tablet (40 mg total) by mouth daily before breakfast. 30 tablet 5  . simvastatin (ZOCOR) 40 MG tablet TAKE 1 TABLET (40 MG TOTAL) BY MOUTH AT BEDTIME. 30 tablet 3   No current facility-administered medications for this visit.    Past Medical History  Diagnosis Date  . Hypertension   . Sleep apnea   . Blood transfusion   . GERD (gastroesophageal reflux disease)   . Pneumonia   . Anemia   . Stress-induced cardiomyopathy 05/2011    a. ?Takotubso cardiomyopathy - distribution was  different than that of LAD stenosis.  . Hyperlipidemia   . Coronary artery disease     a. NSTEMI 05/2011 s/p DES to LAD, with ?Takotsubo distribution of cardiomyopathy at that time with cardiogenic shock. b. NSTEMI 06/2011 ?spasm - stable CAD, normal EF. c. Patent LAD stent, nonobst CAD by cath 08/20/12 with normal EF. d. Hx dyspnea with Brilinta.  . Hypertrophic cardiomyopathy      a. echo 08/2012: focal basal septal hypertrophy, mildly elevated gradient 4mmHg across LVOT.  . Iron deficiency     a. Hx iron infusion.  . Nasal polyp   . Carotid artery disease     a. 3-01% RICA, 60-10% LICA 03/3234.  . Cardiogenic shock     a. 05/2011 requiring pressor therapy.  . Mitral regurgitation     Past Surgical History  Procedure Laterality Date  . Appendectomy    . Partial hysterectomy    . Abdominal hysterectomy    . Wisdom tooth extraction    . Cardiac catheterization  05/2011    80% mid LAD, 40% LCX. EF: 35-40%.   . Coronary angioplasty with stent placement  05/2011    LAD: 2.5 X 16 mm Promus DES  . Cardiac catheterization  06/22/2011    patent LAD stent. Normal EF  . Cardiac catheterization  08/2012    Patent LAD stent with normal ejection fraction.  [name] capsule study N/A 05/12/2014    Procedure: GIVENS CAPSULE STUDY;  Surgeon: Rogene Houston, MD;  Location: [facility];  Service: Endoscopy;  Laterality: N/A;  730  . Esophagogastroduodenoscopy N/A 05/23/2014    Procedure: ESOPHAGOGASTRODUODENOSCOPY (EGD);  Surgeon: Rogene Houston, MD;  Location: [facility];  Service: Endoscopy;  Laterality: N/A;  230 - moved to 1:05 - Ann notified pt  . Left heart catheterization with coronary angiogram N/A 05/12/2011    Procedure: LEFT HEART CATHETERIZATION WITH CORONARY ANGIOGRAM;  Surgeon: Troy Sine, MD;  Location: [facility] CATH LAB;  Service: Cardiovascular;  Laterality: N/A;  . Percutaneous coronary stent intervention (pci-s)  05/12/2011    Procedure: PERCUTANEOUS CORONARY STENT INTERVENTION (PCI-S);  Surgeon: Troy Sine, MD;  Location: [facility] CATH LAB;  Service: Cardiovascular;;  . Left heart catheterization with coronary angiogram N/A 06/22/2011    Procedure: LEFT HEART CATHETERIZATION WITH CORONARY ANGIOGRAM;  Surgeon: Wellington Hampshire, MD;  Location: [place] CATH LAB;  Service: Cardiovascular;  Laterality: N/A;  . Left heart catheterization with coronary angiogram N/A  08/20/2012    Procedure: LEFT HEART CATHETERIZATION WITH CORONARY ANGIOGRAM;  Surgeon: Burnell Blanks, MD;  Location: [facility] CATH LAB;  Service: Cardiovascular;  Laterality: N/A;    History   Social History  . Marital Status: Married    Spouse Name: N/A  . Number of Children: N/A  . Years of Education: N/A   Occupational History  . Not on file.   Social History Main Topics  . Smoking status: Former Smoker -- 1.00 packs/day for 18 years    Types: Cigarettes    Quit date: 05/11/1979  . Smokeless tobacco: Never Used  . Alcohol Use: No  . Drug Use: No  . Sexual Activity: Yes    Birth Control/ Protection: Post-menopausal   Other Topics Concern  . Not on file   Social History Narrative     Filed Vitals:   11/25/14 1412  BP: 144/70  Pulse: 78  Height: 5\' 7"  (1.702 m)  Weight: 135 lb (61.236 kg)  SpO2: 98%    PHYSICAL EXAM General: NAD  Neck: No JVD, no thyromegaly or thyroid nodule.  Lungs: Clear to auscultation bilaterally with normal respiratory effort.  CV: Nondisplaced PMI. Heart regular S1/S2, no S3/S4, I/VI soft pansystolic murmur. No peripheral edema.  Abdomen: Soft, no distention.  Neurologic: Alert and oriented x 3.  Psych: Normal affect.  Extremities: No clubbing or cyanosis.   ECG: Most recent ECG reviewed.      ASSESSMENT AND PLAN: 1. Chest pain and shortness of breath with CAD: As episode of chest pain with radiation into the left shoulder was similar to pain prior to PCI, will add Imdur 30 mg daily and obtain a Lexiscan Cardiolite stress test. Continue ASA, beta blocker and statin. I think some of her symptoms may also be attributable to her large hiatal hernia, for which she likely requires surgery.  2. Essential HTN: Reasonably controlled for age on current therapy. Will monitor given addition of Imdur.  3. Hyperlipidemia: Managed by Dr. Pleas Koch (PCP).   4. Carotid artery stenosis: mild bilaterally (1-39%) in June 2014. Repeat in  June 2016.   5. Hiatal hernia: Described as large. Has definitive post-prandial symptoms. Would recommend referral to general surgery. Will obtain stress test beforehand.  Dispo: f/u 1 month. Scheduled to see PCP next week.  Time spent: 40 minutes, of which greater than 50% was spent reviewing symptoms, relevant blood tests and studies, and discussing management plan with the patient.   [name], M.D., F.A.C.C.

## 2014-11-25 NOTE — Addendum Note (Signed)
Addended by: Barbarann Ehlers A on: 11/25/2014 02:44 PM   Modules accepted: Orders, Level of Service

## 2014-11-25 NOTE — Patient Instructions (Signed)
Your physician recommends that you schedule a follow-up appointment in:  1 month with Dr Bronson Ing    Start Imdur 30 mg daily   Your physician has requested that you have a lexiscan myoview. For further information please visit [email or web address]. Please follow instruction sheet, as given.     Thank you for choosing [facility] !

## 2014-11-26 ENCOUNTER — Encounter (HOSPITAL_COMMUNITY)
Admission: RE | Admit: 2014-11-26 | Discharge: 2014-11-26 | Disposition: A | Discharge status: Home / Self Care | Payer: Medicare Other | Source: Ambulatory Visit | Attending: Cardiovascular Disease | Admitting: Cardiovascular Disease

## 2014-11-26 ENCOUNTER — Encounter (HOSPITAL_COMMUNITY): Payer: Self-pay

## 2014-11-26 ENCOUNTER — Inpatient Hospital Stay (HOSPITAL_COMMUNITY): Admission: RE | Admit: 2014-11-26 | Payer: Medicare Other | Source: Ambulatory Visit

## 2014-11-26 DIAGNOSIS — I5181 Takotsubo syndrome: Secondary | ICD-10-CM | POA: Insufficient documentation

## 2014-11-26 DIAGNOSIS — R0602 Shortness of breath: Secondary | ICD-10-CM | POA: Insufficient documentation

## 2014-11-26 DIAGNOSIS — I209 Angina pectoris, unspecified: Secondary | ICD-10-CM | POA: Diagnosis not present

## 2014-11-26 DIAGNOSIS — I25118 Atherosclerotic heart disease of native coronary artery with other forms of angina pectoris: Secondary | ICD-10-CM | POA: Diagnosis not present

## 2014-11-26 LAB — NM MYOCAR MULTI W/SPECT W/WALL MOTION / EF
CHL CUP NUCLEAR SDS: 0
CHL CUP RESTING HR STRESS: 63 {beats}/min
CSEPPHR: 78 {beats}/min
LVDIAVOL: 71 mL
LVSYSVOL: 20 mL
Nuc Stress EF: 72 %
SRS: 2
SSS: 2
TID: 0.58

## 2014-11-26 MED ORDER — REGADENOSON 0.4 MG/5ML IV SOLN
0.4000 mg | Freq: Once | INTRAVENOUS | Status: AC | PRN
  Administered 2014-11-26: 0.4 mg via INTRAVENOUS
  Filled 2014-11-26: qty 5

## 2014-11-26 MED ORDER — TECHNETIUM TC 99M SESTAMIBI GENERIC - CARDIOLITE
30.0000 | Freq: Once | INTRAVENOUS | Status: AC | PRN
  Administered 2014-11-26: 30 via INTRAVENOUS

## 2014-11-26 MED ORDER — REGADENOSON 0.4 MG/5ML IV SOLN
INTRAVENOUS | Status: AC
  Administered 2014-11-26: 0.4 mg via INTRAVENOUS
  Filled 2014-11-26: qty 5

## 2014-11-26 MED ORDER — SODIUM CHLORIDE 0.9 % IJ SOLN
INTRAMUSCULAR | Status: AC
  Administered 2014-11-26: 10 mL via INTRAVENOUS
  Filled 2014-11-26: qty 3

## 2014-11-26 MED ORDER — SODIUM CHLORIDE 0.9 % IJ SOLN
10.0000 mL | INTRAMUSCULAR | Status: DC | PRN
  Administered 2014-11-26: 10 mL via INTRAVENOUS
  Filled 2014-11-26: qty 10

## 2014-11-26 MED ORDER — TECHNETIUM TC 99M SESTAMIBI - CARDIOLITE
10.0000 | Freq: Once | INTRAVENOUS | Status: AC | PRN
  Administered 2014-11-26: 10 via INTRAVENOUS

## 2014-12-05 DIAGNOSIS — E559 Vitamin D deficiency, unspecified: Secondary | ICD-10-CM | POA: Diagnosis not present

## 2014-12-05 DIAGNOSIS — D509 Iron deficiency anemia, unspecified: Secondary | ICD-10-CM | POA: Diagnosis not present

## 2014-12-05 DIAGNOSIS — I10 Essential (primary) hypertension: Secondary | ICD-10-CM | POA: Diagnosis not present

## 2014-12-05 DIAGNOSIS — E119 Type 2 diabetes mellitus without complications: Secondary | ICD-10-CM | POA: Diagnosis not present

## 2014-12-05 DIAGNOSIS — E782 Mixed hyperlipidemia: Secondary | ICD-10-CM | POA: Diagnosis not present

## 2014-12-05 DIAGNOSIS — D649 Anemia, unspecified: Secondary | ICD-10-CM | POA: Diagnosis not present

## 2014-12-09 DIAGNOSIS — I10 Essential (primary) hypertension: Secondary | ICD-10-CM | POA: Diagnosis not present

## 2014-12-09 DIAGNOSIS — E559 Vitamin D deficiency, unspecified: Secondary | ICD-10-CM | POA: Diagnosis not present

## 2014-12-09 DIAGNOSIS — D509 Iron deficiency anemia, unspecified: Secondary | ICD-10-CM | POA: Diagnosis not present

## 2014-12-09 DIAGNOSIS — Z1389 Encounter for screening for other disorder: Secondary | ICD-10-CM | POA: Diagnosis not present

## 2014-12-09 DIAGNOSIS — L219 Seborrheic dermatitis, unspecified: Secondary | ICD-10-CM | POA: Diagnosis not present

## 2014-12-09 DIAGNOSIS — R1011 Right upper quadrant pain: Secondary | ICD-10-CM | POA: Diagnosis not present

## 2014-12-09 DIAGNOSIS — R5383 Other fatigue: Secondary | ICD-10-CM | POA: Diagnosis not present

## 2014-12-09 DIAGNOSIS — E782 Mixed hyperlipidemia: Secondary | ICD-10-CM | POA: Diagnosis not present

## 2014-12-09 DIAGNOSIS — E119 Type 2 diabetes mellitus without complications: Secondary | ICD-10-CM | POA: Diagnosis not present

## 2014-12-09 DIAGNOSIS — R42 Dizziness and giddiness: Secondary | ICD-10-CM | POA: Diagnosis not present

## 2014-12-18 ENCOUNTER — Ambulatory Visit (INDEPENDENT_AMBULATORY_CARE_PROVIDER_SITE_OTHER): Payer: Medicare Other | Admitting: Internal Medicine

## 2014-12-18 ENCOUNTER — Encounter (INDEPENDENT_AMBULATORY_CARE_PROVIDER_SITE_OTHER): Payer: Self-pay | Admitting: Internal Medicine

## 2014-12-18 VITALS — BP 130/64 | HR 76 | Temp 98.0°F | Resp 18 | Ht 66.0 in | Wt 131.6 lb

## 2014-12-18 DIAGNOSIS — K449 Diaphragmatic hernia without obstruction or gangrene: Secondary | ICD-10-CM

## 2014-12-18 DIAGNOSIS — R0609 Other forms of dyspnea: Secondary | ICD-10-CM | POA: Diagnosis not present

## 2014-12-18 DIAGNOSIS — D132 Benign neoplasm of duodenum: Secondary | ICD-10-CM | POA: Diagnosis not present

## 2014-12-18 DIAGNOSIS — R109 Unspecified abdominal pain: Secondary | ICD-10-CM | POA: Diagnosis not present

## 2014-12-18 DIAGNOSIS — K219 Gastro-esophageal reflux disease without esophagitis: Secondary | ICD-10-CM | POA: Insufficient documentation

## 2014-12-18 DIAGNOSIS — K224 Dyskinesia of esophagus: Secondary | ICD-10-CM | POA: Diagnosis not present

## 2014-12-18 DIAGNOSIS — I25118 Atherosclerotic heart disease of native coronary artery with other forms of angina pectoris: Secondary | ICD-10-CM

## 2014-12-18 MED ORDER — DOCUSATE SODIUM 100 MG PO CAPS: 100.0000 mg | ORAL_CAPSULE | Freq: Two times a day (BID) | ORAL | Status: DC

## 2014-12-18 NOTE — Patient Instructions (Signed)
Can take Phazyme OTC or Gas-X up to 3 times a day on as-needed basis for bloating. Call with progress report in 2 weeks.

## 2014-12-18 NOTE — Progress Notes (Signed)
Presenting complaint;  Patient wants to know if exertional dyspnea is due to hiatal hernia.  GI Database;  Patient is 81 year old Caucasian female who previously has been evaluated by me for iron deficiency anemia. She had negative EGD and colonoscopy in July 2013 by Dr. Britta Mccreedy of [facility] last year. She was therefore referred for small bowel given capsule study. She had 3 are 3 heme positive stools. Patient at that time was on low-dose aspirin. She previously has been on oral anticoagulants. Small bowel given capsule study was performed on 05/12/2014 and revealed scattered gastric erosions 2 of which were covered with blood and single AV malformation at gastric antrum. Given capsule transit time through small bowel was less than 2 hours but no other abnormality was noted. She returned for esophagogastroduodenoscopy on 05/23/2014 which revealed large sliding hiatal hernia with focal gastritis in the level of hiatus as well as erosive gastritis. She also had duodenitis with post bulbar noncritical narrowing and large broad-based polyp involving second part of the duodenum. Piecemeal polypectomy was performed and residual polyp was ablated with snare tip. While most of the polyp was removed polypectomy was felt to be incomplete. Gastric biopsy revealed H pylori gastritis and duodenal polyp was an adenoma. Patient was treated with clarithromycin and amoxicillin for 10 days which she was able to complete but she developed skin rash for which she was seen by Dr. Tarri Glenn PA and is using topical medications. Patient was advised to continue PPI.  Subjective;  Patient states she's been having exertional dyspnea for the last several weeks. She was seen in emergency room at [facility] and 11/22/2014. He subsequently followed up with Dr. Bronson Ing. She was begun on Imdur 30 mg daily and underwent Lexiscan Cardiolite stress test. This study was felt to be negative. It was therefore felt that her respiratory  symptoms may be due to large hiatal hernia. Patient's primary care physician Dr. Pleas Koch called last week and arrange for this visit. I recommended doing upper GI series which was done this morning results are reviewed below. Patient has exertional dyspnea on walking a few yards. She does not have chest pain. She denies cough heartburn dysphagia nausea or vomiting. She has good appetite and eats all the time. She has occasional soreness in epigastric region but she denies melena or rectal bleeding. Her bowels Patrici Ranks move every third day. She uses an enema at least once a week. She denies melena or rectal bleeding. She states she has not required iron infusion in over a year. She has not lost any weight since her last visit her office in November 2015. She denies PND or orthopnea. She is walking on treadmill at a low speed for about 5 minutes. She states she gets short of breath. He is hoping to gradually increase to time limit.   Current Medications: Outpatient Encounter Prescriptions as of 12/18/2014  Medication Sig  . aspirin EC 81 MG tablet Take 81 mg by mouth daily.  . Benzocaine-Benzethonium (LANACANE ANTI-BACTERIAL EX) Apply topically.  . clotrimazole (LOTRIMIN) 1 % cream Apply 1 application topically.  . diazepam (VALIUM) 5 MG tablet Take 5 mg by mouth every 6 (six) hours as needed for anxiety.  . hydrocortisone cream 0.5 % Apply topically 3 (three) times daily. For rash  . isosorbide mononitrate (IMDUR) 30 MG 24 hr tablet Take 1 tablet (30 mg total) by mouth daily.  . metoprolol succinate (TOPROL-XL) 25 MG 24 hr tablet TAKE ONE TABLET BY MOUTH ONCE EVERY DAY  . nitroGLYCERIN (  NITROSTAT) 0.4 MG SL tablet Place 1 tablet (0.4 mg total) under the tongue every 5 (five) minutes as needed for chest pain.  . pantoprazole (PROTONIX) 40 MG tablet Take 1 tablet (40 mg total) by mouth daily before breakfast.  . simvastatin (ZOCOR) 40 MG tablet TAKE 1 TABLET (40 MG TOTAL) BY MOUTH AT BEDTIME.  Marland Kitchen  terbinafine (LAMISIL) 1 % cream Apply topically.  . triamcinolone lotion (KENALOG) 0.1 % Apply 1 application topically 3 (three) times daily.  Marland Kitchen docusate sodium (COLACE) 100 MG capsule Take 1 capsule (100 mg total) by mouth 2 (two) times daily.  . [DISCONTINUED] bismuth-metronidazole-tetracycline (PYLERA) 140-125-125 MG per capsule Take 3 capsules by mouth 4 (four) times daily -  before meals and at bedtime.  . [DISCONTINUED] diphenhydrAMINE (BENADRYL) 25 mg capsule Take 25 mg by mouth as needed.   . [DISCONTINUED] hydrocortisone (ANUSOL-HC) 2.5 % rectal cream Place 1 application rectally as needed for hemorrhoids or itching.   No facility-administered encounter medications on file as of 12/18/2014.   Past Medical History  Diagnosis Date  . Hypertension   . Sleep apnea   . Blood transfusion   . GERD (gastroesophageal reflux disease)   . Pneumonia   . Anemia   . Stress-induced cardiomyopathy 05/2011    a. ?Takotubso cardiomyopathy - distribution was different than that of LAD stenosis.  . Hyperlipidemia   . Coronary artery disease     a. NSTEMI 05/2011 s/p DES to LAD, with ?Takotsubo distribution of cardiomyopathy at that time with cardiogenic shock. b. NSTEMI 06/2011 ?spasm - stable CAD, normal EF. c. Patent LAD stent, nonobst CAD by cath 08/20/12 with normal EF. d. Hx dyspnea with Brilinta.  . Hypertrophic cardiomyopathy     a. echo 08/2012: focal basal septal hypertrophy, mildly elevated gradient 48mmHg across LVOT.  . Iron deficiency     a. Hx iron infusion.  . Nasal polyp   . Carotid artery disease     a. 1-54% RICA, 00-86% LICA 01/6194.  . Cardiogenic shock     a. 05/2011 requiring pressor therapy.  . Mitral regurgitation   . Cancer     Skin   Past Surgical History  Procedure Laterality Date  . Appendectomy    . Partial hysterectomy    . Abdominal hysterectomy    . Wisdom tooth extraction    . Cardiac catheterization  05/2011    80% mid LAD, 40% LCX. EF: 35-40%.   .  Coronary angioplasty with stent placement  05/2011    LAD: 2.5 X 16 mm Promus DES  . Cardiac catheterization  06/22/2011    patent LAD stent. Normal EF  . Cardiac catheterization  08/2012    Patent LAD stent with normal ejection fraction.  [name] capsule study N/A 05/12/2014    Procedure: GIVENS CAPSULE STUDY;  Surgeon: Rogene Houston, MD;  [facility];  Service: Endoscopy;  Laterality: N/A;  730  . Esophagogastroduodenoscopy N/A 05/23/2014    Procedure: ESOPHAGOGASTRODUODENOSCOPY (EGD);  Surgeon: Rogene Houston, MD;  Location: [facility];  Service: Endoscopy;  Laterality: N/A;  230 - moved to 1:05 - Ann notified pt  . Left heart catheterization with coronary angiogram N/A 05/12/2011    Procedure: LEFT HEART CATHETERIZATION WITH CORONARY ANGIOGRAM;  Surgeon: Troy Sine, MD;  Location: [facility] CATH LAB;  Service: Cardiovascular;  Laterality: N/A;  . Percutaneous coronary stent intervention (pci-s)  05/12/2011    Procedure: PERCUTANEOUS CORONARY STENT INTERVENTION (PCI-S);  Surgeon: Troy Sine, MD;  Location: [facility] CATH LAB;  Service: Cardiovascular;;  . Left heart catheterization with coronary angiogram N/A 06/22/2011    Procedure: LEFT HEART CATHETERIZATION WITH CORONARY ANGIOGRAM;  Surgeon: Wellington Hampshire, MD;  Location: [facility] CATH LAB;  Service: Cardiovascular;  Laterality: N/A;  . Left heart catheterization with coronary angiogram N/A 08/20/2012    Procedure: LEFT HEART CATHETERIZATION WITH CORONARY ANGIOGRAM;  Surgeon: Burnell Blanks, MD;  Location: [facility] CATH LAB;  Service: Cardiovascular;  Laterality: N/A;      Objective: Blood pressure 130/64, pulse 76, temperature 98 F (36.7 C), resp. rate 18, height 5\' 6"  (1.676 m), weight 131 lb 9.6 oz (59.693 kg). Patient is alert and in no acute distress.  Conjunctiva is pink. Sclera is nonicteric Oropharyngeal mucosa is normal.  No neck masses or thyromegaly noted. Cardiac exam with regular rhythm normal S1 and S2. Grade  2/6 systolic ejection murmur noted at  Lungs are clear to auscultation. Abdomen symmetrical. Bowel sounds are normal. On palpation abdomen is soft with mild midepigastric tenderness but no organomegaly or masses.  No LE edema or clubbing noted.  Labs/studies Results:   Upper GI series films reviewed with patient and her husband. This study was performed at [facility] earlier today. At least one third of the proximal stomach is intrathoracic. Tertiary contracts is noted to distal esophagus suggestive of esophageal motility disorder. 13 mm barium pill passed through esophagus without delay.   ECHO results from June 2013 studies reviewed. Moderate LVH mild focal basal hypertrophy of the septum normal systolic function EF 01-77% no regional wall motion abnormalities. Grade 1 diastolic dysfunction. Mild MR and moderate PR noted  Assessment:  #1. Exertional dyspnea. I am not convinced that this symptom is secondary to large hiatal hernia. I've asked patient to walk before and after meals to see if there is any difference. She has tendency towards constipation and she should make every effort to have daily bowel movement. Patient's ECHO was 3 years ago revealing grade 1 diastolic dysfunction. Will confer with Dr. Bronson Ing if echo should be repeated. #2. GERD. She has known large hiatal hernia noted on EGD of November 2015 and an upper GI series done earlier today. Symptoms appear to be well controlled. This hernia could be fixed laparoscopically if there is an equivocal evidence that it is causing her shortness of breath. #3. History of iron deficiency anemia. Recent H&H by Dr. Florene Route was normal and she has not required iron infusion in over a year. #4. 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 She needs follow-up EGD to treat residual or recurrent adenoma. Procedure will be scheduled once elevation for dyspnea has been completed.   Recommendations;  Patient advised to compare  walking  walking on treadmill before and after meals to determine if she is able to breathe better empty stomach. Colace 100 mg by mouth twice a day. Can can use Phazyme OTC or Gas-X OTC 3 times a day when necessary for bloating. Patient will call office with progress report in 2 weeks. Will schedule esophagogastroduodenoscopy with polypectomy when acute symptoms have resolved.

## 2014-12-24 DIAGNOSIS — D352 Benign neoplasm of pituitary gland: Secondary | ICD-10-CM | POA: Diagnosis not present

## 2014-12-26 ENCOUNTER — Encounter (INDEPENDENT_AMBULATORY_CARE_PROVIDER_SITE_OTHER): Payer: Self-pay

## 2015-01-02 ENCOUNTER — Other Ambulatory Visit (INDEPENDENT_AMBULATORY_CARE_PROVIDER_SITE_OTHER): Payer: Self-pay | Admitting: Internal Medicine

## 2015-01-02 ENCOUNTER — Telehealth (INDEPENDENT_AMBULATORY_CARE_PROVIDER_SITE_OTHER): Payer: Self-pay | Admitting: *Deleted

## 2015-01-02 NOTE — Telephone Encounter (Signed)
PR: still have a lot of SOB, appetite mediocre, she is eating smaller meals and more often as you suggested, still taking stool softener 3 tab bid, sleeping pretty good, haven't accomplished goal of 2  miles on treadmill because of SOB, please call cell# [phone]

## 2015-01-02 NOTE — Telephone Encounter (Signed)
Call returned. I told patient that I'm not convinced that her exertional dyspnea secondary to hiatal hernia. Patient reports no improvement in dyspnea with that she walks before or after meal. She has an appointment to see Dr. Bronson Ing in 10 days. Patient will call after cardiology evaluation completed

## 2015-01-06 ENCOUNTER — Other Ambulatory Visit (INDEPENDENT_AMBULATORY_CARE_PROVIDER_SITE_OTHER): Payer: Self-pay | Admitting: Internal Medicine

## 2015-01-12 ENCOUNTER — Other Ambulatory Visit: Payer: Self-pay | Admitting: Cardiovascular Disease

## 2015-01-12 ENCOUNTER — Ambulatory Visit (HOSPITAL_COMMUNITY)
Admission: RE | Admit: 2015-01-12 | Discharge: 2015-01-12 | Disposition: A | Discharge status: Home / Self Care | Payer: Medicare Other | Source: Ambulatory Visit | Attending: Cardiovascular Disease | Admitting: Cardiovascular Disease

## 2015-01-12 ENCOUNTER — Encounter: Payer: Self-pay | Admitting: Cardiovascular Disease

## 2015-01-12 ENCOUNTER — Ambulatory Visit (INDEPENDENT_AMBULATORY_CARE_PROVIDER_SITE_OTHER): Payer: Medicare Other | Admitting: Cardiovascular Disease

## 2015-01-12 ENCOUNTER — Encounter: Payer: Self-pay | Admitting: *Deleted

## 2015-01-12 VITALS — BP 164/82 | HR 71 | Ht 66.0 in | Wt 129.0 lb

## 2015-01-12 DIAGNOSIS — I10 Essential (primary) hypertension: Secondary | ICD-10-CM | POA: Diagnosis not present

## 2015-01-12 DIAGNOSIS — S2231XA Fracture of one rib, right side, initial encounter for closed fracture: Secondary | ICD-10-CM | POA: Diagnosis not present

## 2015-01-12 DIAGNOSIS — I25118 Atherosclerotic heart disease of native coronary artery with other forms of angina pectoris: Secondary | ICD-10-CM

## 2015-01-12 DIAGNOSIS — K449 Diaphragmatic hernia without obstruction or gangrene: Secondary | ICD-10-CM | POA: Insufficient documentation

## 2015-01-12 DIAGNOSIS — X58XXXA Exposure to other specified factors, initial encounter: Secondary | ICD-10-CM | POA: Insufficient documentation

## 2015-01-12 DIAGNOSIS — I6523 Occlusion and stenosis of bilateral carotid arteries: Secondary | ICD-10-CM

## 2015-01-12 DIAGNOSIS — R0609 Other forms of dyspnea: Secondary | ICD-10-CM | POA: Diagnosis not present

## 2015-01-12 DIAGNOSIS — R079 Chest pain, unspecified: Secondary | ICD-10-CM | POA: Insufficient documentation

## 2015-01-12 DIAGNOSIS — Z9289 Personal history of other medical treatment: Secondary | ICD-10-CM

## 2015-01-12 DIAGNOSIS — J439 Emphysema, unspecified: Secondary | ICD-10-CM | POA: Diagnosis not present

## 2015-01-12 DIAGNOSIS — I5181 Takotsubo syndrome: Secondary | ICD-10-CM

## 2015-01-12 DIAGNOSIS — E785 Hyperlipidemia, unspecified: Secondary | ICD-10-CM

## 2015-01-12 DIAGNOSIS — I209 Angina pectoris, unspecified: Secondary | ICD-10-CM | POA: Diagnosis not present

## 2015-01-12 DIAGNOSIS — Z87891 Personal history of nicotine dependence: Secondary | ICD-10-CM | POA: Insufficient documentation

## 2015-01-12 DIAGNOSIS — Z87898 Personal history of other specified conditions: Secondary | ICD-10-CM

## 2015-01-12 DIAGNOSIS — I25708 Atherosclerosis of coronary artery bypass graft(s), unspecified, with other forms of angina pectoris: Secondary | ICD-10-CM

## 2015-01-12 MED ORDER — AMLODIPINE BESYLATE 5 MG PO TABS: 5.0000 mg | ORAL_TABLET | Freq: Every day | ORAL | Status: DC

## 2015-01-12 NOTE — Patient Instructions (Addendum)
Your physician recommends that you schedule a follow-up appointment GB:[facility] HEART CATH  Your physician has requested that you have a cardiac catheterization. Cardiac catheterization is used to diagnose and/or treat various heart conditions. Doctors may recommend this procedure for a number of different reasons. The most common reason is to evaluate chest pain. Chest pain can be a symptom of coronary artery disease (CAD), and cardiac catheterization can show whether plaque is narrowing or blocking your heart's arteries. This procedure is also used to evaluate the valves, as well as measure the blood flow and oxygen levels in different parts of your heart. For further information please visit [email or web address]. Please follow instruction sheet, as given.  Your physician has requested that you have an echocardiogram. Echocardiography is a painless test that uses sound waves to create images of your heart. It provides your doctor with information about the size and shape of your heart and how well your heart's chambers and valves are working. This procedure takes approximately one hour. There are no restrictions for this procedure.   STOP Imdur  START Amlodipine 5 mg daily   Get Chest X-ray TODAY      Thank you for choosing [facility] !

## 2015-01-12 NOTE — Progress Notes (Signed)
Patient ID: Victoria Short, female   DOB: 1925/04/21, 81 y.o.   MRN: [id number]      SUBJECTIVE: The patient returns for follow-up after undergoing cardiovascular testing performed for the evaluation of chest pain and exertional dyspnea. Nuclear stress test was normal, LVEF 72%. She saw gastroenterology and they did not feel that her hiatal hernia was necessarily contributing to her symptoms. I started Imdur at her last visit but she does not feel it made much of a difference. She has not been able to walk on a treadmill anymore due to shortness of breath.  In summary, she has a history of coronary artery disease status post angioplasty and drug-eluting stent placement to the LAD in November of 2012. She also has history of stress-induced cardiomyopathy. Her ejection fraction subsequently normalized. In 2014, she presented with chest pain similar to her previous myocardial infarction. Interestingly, the chest pain happened after she was upset about waiting too long in a physician's office. She was transferred to [facility] where she underwent cardiac catheterization which showed a patent LAD stent with no evidence of obstructive disease. Ejection fraction was normal.  She was evaluated on 11/22/14 at [facility] for chest pain with radiation into her left shoulder. She had been cooking dinner when the pain began and thought it was similar to her pain prior to percutaneous coronary intervention. She took 2 sublingual nitroglycerin which helped alleviate her pain. Blood pressure was markedly elevated at 177/104.   She reportedly initially refused observation secondary to insurance regions and threatened to sign out AMA. Troponin was normal. The physician wanted to hospitalize her but she refused.   Chest x-ray showed mild bilateral midlung opacities which could reflect a mild infectious process or possibly atelectasis. There was a large hiatal hernia. Basic metabolic panel was reviewed  and found to be unremarkable. Troponins were negative 2. White count and hemoglobin were normal. ECG demonstrated normal sinus rhythm with no gross ischemic ST segment or T-wave abnormalities with possible old inferior infarct.   Soc: She and her husband, who is also my patient, moved here from [place] in 2003.  Review of Systems: As per "subjective", otherwise negative.  Allergies  Allergen Reactions  . Brilinta [Ticagrelor]     H/o dyspnea with this  . Iodine Nausea And Vomiting  . Other     streptomycin  . Shellfish Allergy     Unsteady gait - No anaphylaxis    Current Outpatient Prescriptions  Medication Sig Dispense Refill  . aspirin EC 81 MG tablet Take 81 mg by mouth daily.    . Benzocaine-Benzethonium (LANACANE ANTI-BACTERIAL EX) Apply topically.    . clotrimazole (LOTRIMIN) 1 % cream Apply 1 application topically.    . diazepam (VALIUM) 5 MG tablet Take 5 mg by mouth every 6 (six) hours as needed for anxiety.    . docusate sodium (COLACE) 100 MG capsule Take 1 capsule (100 mg total) by mouth 2 (two) times daily. 1 capsule 0  . hydrocortisone cream 0.5 % Apply topically 3 (three) times daily. For rash    . isosorbide mononitrate (IMDUR) 30 MG 24 hr tablet Take 1 tablet (30 mg total) by mouth daily. 90 tablet 3  . metoprolol succinate (TOPROL-XL) 25 MG 24 hr tablet TAKE ONE TABLET BY MOUTH ONCE EVERY DAY 30 tablet 5  . nitroGLYCERIN (NITROSTAT) 0.4 MG SL tablet Place 1 tablet (0.4 mg total) under the tongue every 5 (five) minutes as needed for chest pain. 25 tablet 3  .  pantoprazole (PROTONIX) 40 MG tablet TAKE 1 TABLET (40 MG TOTAL) BY MOUTH DAILY BEFORE BREAKFAST. 30 tablet 4  . simvastatin (ZOCOR) 40 MG tablet TAKE 1 TABLET (40 MG TOTAL) BY MOUTH AT BEDTIME. 30 tablet 3  . terbinafine (LAMISIL) 1 % cream Apply topically.    . triamcinolone lotion (KENALOG) 0.1 % Apply 1 application topically 3 (three) times daily.    . Ascorbic Acid (VITAMIN C) 500 MG CAPS Take 1 tablet by  mouth.     No current facility-administered medications for this visit.    Past Medical History  Diagnosis Date  . Hypertension   . Sleep apnea   . Blood transfusion   . GERD (gastroesophageal reflux disease)   . Pneumonia   . Anemia   . Stress-induced cardiomyopathy 05/2011    a. ?Takotubso cardiomyopathy - distribution was different than that of LAD stenosis.  . Hyperlipidemia   . Coronary artery disease     a. NSTEMI 05/2011 s/p DES to LAD, with ?Takotsubo distribution of cardiomyopathy at that time with cardiogenic shock. b. NSTEMI 06/2011 ?spasm - stable CAD, normal EF. c. Patent LAD stent, nonobst CAD by cath 08/20/12 with normal EF. d. Hx dyspnea with Brilinta.  . Hypertrophic cardiomyopathy     a. echo 08/2012: focal basal septal hypertrophy, mildly elevated gradient 30mmHg across LVOT.  . Iron deficiency     a. Hx iron infusion.  . Nasal polyp   . Carotid artery disease     a. 7-20% RICA, 94-70% LICA 03/6282.  . Cardiogenic shock     a. 05/2011 requiring pressor therapy.  . Mitral regurgitation   . Cancer     Skin    Past Surgical History  Procedure Laterality Date  . Appendectomy    . Partial hysterectomy    . Abdominal hysterectomy    . Wisdom tooth extraction    . Cardiac catheterization  05/2011    80% mid LAD, 40% LCX. EF: 35-40%.   . Coronary angioplasty with stent placement  05/2011    LAD: 2.5 X 16 mm Promus DES  . Cardiac catheterization  06/22/2011    patent LAD stent. Normal EF  . Cardiac catheterization  08/2012    Patent LAD stent with normal ejection fraction.  [name] capsule study N/A 05/12/2014    Procedure: GIVENS CAPSULE STUDY;  Surgeon: Rogene Houston, MD;  [facility];  Service: Endoscopy;  Laterality: N/A;  730  . Esophagogastroduodenoscopy N/A 05/23/2014    Procedure: ESOPHAGOGASTRODUODENOSCOPY (EGD);  Surgeon: Rogene Houston, MD;  [facility];  Service: Endoscopy;  Laterality: N/A;  230 - moved to 1:05 - Ann  notified pt  . Left heart catheterization with coronary angiogram N/A 05/12/2011    Procedure: LEFT HEART CATHETERIZATION WITH CORONARY ANGIOGRAM;  Surgeon: Troy Sine, MD;  Location: [facility] CATH LAB;  Service: Cardiovascular;  Laterality: N/A;  . Percutaneous coronary stent intervention (pci-s)  05/12/2011    Procedure: PERCUTANEOUS CORONARY STENT INTERVENTION (PCI-S);  Surgeon: Troy Sine, MD;  Location: [facility] CATH LAB;  Service: Cardiovascular;;  . Left heart catheterization with coronary angiogram N/A 06/22/2011    Procedure: LEFT HEART CATHETERIZATION WITH CORONARY ANGIOGRAM;  Surgeon: Wellington Hampshire, MD;  Location: [facility] CATH LAB;  Service: Cardiovascular;  Laterality: N/A;  . Left heart catheterization with coronary angiogram N/A 08/20/2012    Procedure: LEFT HEART CATHETERIZATION WITH CORONARY ANGIOGRAM;  Surgeon: Burnell Blanks, MD;  Location: [facility] CATH LAB;  Service: Cardiovascular;  Laterality: N/A;    History   Social History  . Marital Status: Married    Spouse Name: N/A  . Number of Children: N/A  . Years of Education: N/A   Occupational History  . Not on file.   Social History Main Topics  . Smoking status: Former Smoker -- 1.00 packs/day for 18 years    Types: Cigarettes    Quit date: 05/11/1979  . Smokeless tobacco: Never Used  . Alcohol Use: No  . Drug Use: No  . Sexual Activity: Yes    Birth Control/ Protection: Post-menopausal   Other Topics Concern  . Not on file   Social History Narrative     Filed Vitals:   01/12/15 1125  BP: 164/82  Pulse: 71  Height: 5\' 6"  (1.676 m)  Weight: 129 lb (58.514 kg)    PHYSICAL EXAM General: NAD  Neck: No JVD, no thyromegaly or thyroid nodule.  Lungs: Dry crackles at bases. CV: Nondisplaced PMI. Heart regular S1/S2, no S3/S4, I/VI soft pansystolic murmur. No peripheral edema.  Abdomen: Soft, no distention.  Neurologic: Alert and oriented x 3.  Psych: Normal affect.  Extremities: No clubbing or  cyanosis.   ECG: Most recent ECG reviewed.      ASSESSMENT AND PLAN: 1. Chest pain and shortness of breath with CAD: Symptoms continue to progress. Although she had a normal Lexiscan Cardiolite stress test, I am concerned about symptom progression. I will d/c Imdur and add amlodipine for both BP control and anginal relief. I will also schedule right and left heart catheterization. If there is no obstructive CAD, I will obtain PFT's. Will obtain an echocardiogram to assess if there has been an interval change in LV systolic function.  Will also obtain a chest xray. She quit smoking 20 years ago. Continue ASA, beta blocker, and statin.   2. Essential HTN: Mildly elevated. Will add amlodipine 5 mg daily.  3. Hyperlipidemia: Managed by Dr. Pleas Koch (PCP).   4. Carotid artery stenosis: Mild bilaterally (1-39%) in June 2014. Will repeat Dopplers after cath.  5. Hiatal hernia: Followed by GI.  Dispo: f/u after cath.  Time spent: 40 minutes, of which greater than 50% was spent reviewing symptoms, relevant blood tests and studies, and discussing management plan with the patient.   [name], M.D., F.A.C.C.

## 2015-01-16 ENCOUNTER — Other Ambulatory Visit (HOSPITAL_COMMUNITY): Payer: Medicare Other

## 2015-01-19 ENCOUNTER — Ambulatory Visit (HOSPITAL_COMMUNITY)
Admission: RE | Admit: 2015-01-19 | Discharge: 2015-01-19 | Disposition: A | Discharge status: Home / Self Care | Payer: Medicare Other | Source: Ambulatory Visit | Attending: Interventional Cardiology | Admitting: Interventional Cardiology

## 2015-01-19 ENCOUNTER — Encounter (HOSPITAL_COMMUNITY)
Admission: RE | Disposition: A | Discharge status: Home / Self Care | Payer: Medicare Other | Source: Ambulatory Visit | Attending: Interventional Cardiology

## 2015-01-19 ENCOUNTER — Encounter (HOSPITAL_COMMUNITY): Payer: Self-pay | Admitting: Interventional Cardiology

## 2015-01-19 DIAGNOSIS — Z955 Presence of coronary angioplasty implant and graft: Secondary | ICD-10-CM | POA: Diagnosis not present

## 2015-01-19 DIAGNOSIS — I2510 Atherosclerotic heart disease of native coronary artery without angina pectoris: Secondary | ICD-10-CM | POA: Diagnosis not present

## 2015-01-19 DIAGNOSIS — R0609 Other forms of dyspnea: Secondary | ICD-10-CM | POA: Diagnosis not present

## 2015-01-19 DIAGNOSIS — I350 Nonrheumatic aortic (valve) stenosis: Secondary | ICD-10-CM | POA: Diagnosis not present

## 2015-01-19 DIAGNOSIS — I10 Essential (primary) hypertension: Secondary | ICD-10-CM | POA: Insufficient documentation

## 2015-01-19 DIAGNOSIS — I25708 Atherosclerosis of coronary artery bypass graft(s), unspecified, with other forms of angina pectoris: Secondary | ICD-10-CM

## 2015-01-19 HISTORY — PX: CARDIAC CATHETERIZATION: SHX172

## 2015-01-19 LAB — POCT I-STAT 3, VENOUS BLOOD GAS (G3P V)
ACID-BASE EXCESS: 4 mmol/L — AB (ref 0.0–2.0)
ACID-BASE EXCESS: 4 mmol/L — AB (ref 0.0–2.0)
Acid-Base Excess: 3 mmol/L — ABNORMAL HIGH (ref 0.0–2.0)
BICARBONATE: 30.9 meq/L — AB (ref 20.0–24.0)
Bicarbonate: 29.5 mEq/L — ABNORMAL HIGH (ref 20.0–24.0)
Bicarbonate: 30.6 mEq/L — ABNORMAL HIGH (ref 20.0–24.0)
O2 Saturation: 64 %
O2 Saturation: 74 %
O2 Saturation: 80 %
PCO2 VEN: 50 mmHg (ref 45.0–50.0)
PCO2 VEN: 51.9 mmHg — AB (ref 45.0–50.0)
PCO2 VEN: 53.8 mmHg — AB (ref 45.0–50.0)
PH VEN: 7.378 — AB (ref 7.250–7.300)
TCO2: 33 mmol/L (ref 0–100)
TCO2: 31 mmol/L (ref 0–100)
TCO2: 32 mmol/L (ref 0–100)
pH, Ven: 7.368 — ABNORMAL HIGH (ref 7.250–7.300)
pH, Ven: 7.379 — ABNORMAL HIGH (ref 7.250–7.300)
pO2, Ven: 35 mmHg (ref 30.0–45.0)
pO2, Ven: 40 mmHg (ref 30.0–45.0)
pO2, Ven: 46 mmHg — ABNORMAL HIGH (ref 30.0–45.0)

## 2015-01-19 LAB — BASIC METABOLIC PANEL
ANION GAP: 7 (ref 5–15)
BUN: 10 mg/dL (ref 6–20)
CALCIUM: 9.6 mg/dL (ref 8.9–10.3)
CHLORIDE: 103 mmol/L (ref 101–111)
CO2: 33 mmol/L — ABNORMAL HIGH (ref 22–32)
Creatinine, Ser: 0.71 mg/dL (ref 0.44–1.00)
GFR calc non Af Amer: >60 mL/min (ref >60)
Glucose, Bld: 107 mg/dL — ABNORMAL HIGH (ref 65–99)
POTASSIUM: 4 mmol/L (ref 3.5–5.1)
Sodium: 143 mmol/L (ref 135–145)

## 2015-01-19 LAB — CBC
HEMATOCRIT: 44.8 % (ref 36.0–46.0)
HEMOGLOBIN: 15 g/dL (ref 12.0–15.0)
MCH: 33.4 pg (ref 26.0–34.0)
MCHC: 33.5 g/dL (ref 30.0–36.0)
MCV: 99.8 fL (ref 78.0–100.0)
Platelets: 146 10*3/uL — ABNORMAL LOW (ref 150–400)
RBC: 4.49 MIL/uL (ref 3.87–5.11)
RDW: 14 % (ref 11.5–15.5)
WBC: 4.1 10*3/uL (ref 4.0–10.5)

## 2015-01-19 LAB — POCT I-STAT 3, ART BLOOD GAS (G3+)
Acid-Base Excess: 2 mmol/L (ref 0.0–2.0)
Bicarbonate: 28.2 mEq/L — ABNORMAL HIGH (ref 20.0–24.0)
O2 SAT: 87 %
PCO2 ART: 47.8 mmHg — AB (ref 35.0–45.0)
TCO2: 30 mmol/L (ref 0–100)
pH, Arterial: 7.379 (ref 7.350–7.450)
pO2, Arterial: 55 mmHg — ABNORMAL LOW (ref 80.0–100.0)

## 2015-01-19 LAB — PROTIME-INR
INR: 1.03 (ref 0.00–1.49)
PROTHROMBIN TIME: 13.7 s (ref 11.6–15.2)

## 2015-01-19 SURGERY — RIGHT/LEFT HEART CATH AND CORONARY ANGIOGRAPHY

## 2015-01-19 MED ORDER — SODIUM CHLORIDE 0.9 % WEIGHT BASED INFUSION
3.0000 mL/kg/h | INTRAVENOUS | Status: DC
  Administered 2015-01-19: 3 mL/kg/h via INTRAVENOUS

## 2015-01-19 MED ORDER — DIPHENHYDRAMINE HCL 50 MG/ML IJ SOLN
25.0000 mg | Freq: Once | INTRAMUSCULAR | Status: AC
  Administered 2015-01-19: 25 mg via INTRAVENOUS

## 2015-01-19 MED ORDER — SODIUM CHLORIDE 0.9 % WEIGHT BASED INFUSION: 1.0000 mL/kg/h | INTRAVENOUS | Status: DC

## 2015-01-19 MED ORDER — DEXTROSE 5 % IV SOLN
20.0000 mg/h | INTRAVENOUS | Status: DC
  Filled 2015-01-19: qty 4

## 2015-01-19 MED ORDER — FENTANYL CITRATE (PF) 100 MCG/2ML IJ SOLN
INTRAMUSCULAR | Status: DC | PRN
  Administered 2015-01-19: 25 ug via INTRAVENOUS

## 2015-01-19 MED ORDER — DIPHENHYDRAMINE HCL 50 MG/ML IJ SOLN
INTRAMUSCULAR | Status: AC
  Administered 2015-01-19: 25 mg via INTRAVENOUS
  Filled 2015-01-19: qty 1

## 2015-01-19 MED ORDER — VERAPAMIL HCL 2.5 MG/ML IV SOLN
INTRAVENOUS | Status: AC
  Filled 2015-01-19: qty 2

## 2015-01-19 MED ORDER — HEPARIN (PORCINE) IN NACL 2-0.9 UNIT/ML-% IJ SOLN
INTRAMUSCULAR | Status: AC
  Filled 2015-01-19: qty 1500

## 2015-01-19 MED ORDER — FENTANYL CITRATE (PF) 100 MCG/2ML IJ SOLN
INTRAMUSCULAR | Status: AC
  Filled 2015-01-19: qty 2

## 2015-01-19 MED ORDER — SODIUM CHLORIDE 0.9 % IJ SOLN: 3.0000 mL | INTRAMUSCULAR | Status: DC | PRN

## 2015-01-19 MED ORDER — SODIUM CHLORIDE 0.9 % IJ SOLN: 3.0000 mL | Freq: Two times a day (BID) | INTRAMUSCULAR | Status: DC

## 2015-01-19 MED ORDER — ASPIRIN 81 MG PO CHEW: 81.0000 mg | CHEWABLE_TABLET | ORAL | Status: AC

## 2015-01-19 MED ORDER — SODIUM CHLORIDE 0.9 % IV SOLN: 250.0000 mL | INTRAVENOUS | Status: DC | PRN

## 2015-01-19 MED ORDER — FAMOTIDINE IN NACL 20-0.9 MG/50ML-% IV SOLN
INTRAVENOUS | Status: AC
Start: 2015-01-19 — End: 2015-01-19
  Administered 2015-01-19: 20 mg
  Filled 2015-01-19: qty 50

## 2015-01-19 MED ORDER — METHYLPREDNISOLONE SODIUM SUCC 125 MG IJ SOLR
INTRAMUSCULAR | Status: AC
  Administered 2015-01-19: 60 mg via INTRAVENOUS
  Filled 2015-01-19: qty 2

## 2015-01-19 MED ORDER — NITROGLYCERIN 1 MG/10 ML FOR IR/CATH LAB
INTRA_ARTERIAL | Status: AC
  Filled 2015-01-19: qty 10

## 2015-01-19 MED ORDER — SODIUM CHLORIDE 0.9 % WEIGHT BASED INFUSION: 3.0000 mL/kg/h | INTRAVENOUS | Status: AC

## 2015-01-19 MED ORDER — METHYLPREDNISOLONE SODIUM SUCC 125 MG IJ SOLR
60.0000 mg | Freq: Once | INTRAMUSCULAR | Status: AC
  Administered 2015-01-19: 60 mg via INTRAVENOUS

## 2015-01-19 MED ORDER — IOHEXOL 350 MG/ML SOLN
INTRAVENOUS | Status: DC | PRN
  Administered 2015-01-19: 90 mL via INTRA_ARTERIAL

## 2015-01-19 MED ORDER — HYDRALAZINE HCL 20 MG/ML IJ SOLN
10.0000 mg | INTRAMUSCULAR | Status: DC | PRN
  Administered 2015-01-19: 10 mg via INTRAVENOUS

## 2015-01-19 MED ORDER — LIDOCAINE HCL (PF) 1 % IJ SOLN
INTRAMUSCULAR | Status: AC
  Filled 2015-01-19: qty 30

## 2015-01-19 MED ORDER — SODIUM CHLORIDE 0.9 % IJ SOLN
INTRAMUSCULAR | Status: DC | PRN
  Administered 2015-01-19: 08:00:00 via INTRA_ARTERIAL

## 2015-01-19 MED ORDER — HEPARIN SODIUM (PORCINE) 1000 UNIT/ML IJ SOLN
INTRAMUSCULAR | Status: AC
  Filled 2015-01-19: qty 1

## 2015-01-19 MED ORDER — HYDRALAZINE HCL 20 MG/ML IJ SOLN
INTRAMUSCULAR | Status: AC
  Filled 2015-01-19: qty 1

## 2015-01-19 SURGICAL SUPPLY — 22 items
CATH BALLN WEDGE 5F 110CM (CATHETERS) ×3 IMPLANT
CATH INFINITI 4FR 145 PIGTAIL (CATHETERS) ×3 IMPLANT
CATH INFINITI 5 FR JL3.5 (CATHETERS) ×3 IMPLANT
CATH INFINITI 5FR ANG PIGTAIL (CATHETERS) ×3 IMPLANT
CATH INFINITI 5FR MULTPACK ANG (CATHETERS) IMPLANT
CATH INFINITI JR4 5F (CATHETERS) ×3 IMPLANT
CATH SWAN GANZ 7F STRAIGHT (CATHETERS) IMPLANT
DEVICE RAD COMP TR BAND LRG (VASCULAR PRODUCTS) ×3 IMPLANT
GLIDESHEATH SLEND SS 6F .021 (SHEATH) ×3 IMPLANT
KIT HEART LEFT (KITS) ×3 IMPLANT
KIT HEART RIGHT NAMIC (KITS) ×3 IMPLANT
PACK CARDIAC CATHETERIZATION (CUSTOM PROCEDURE TRAY) ×3 IMPLANT
SHEATH FAST CATH BRACH 5F 5CM (SHEATH) ×3 IMPLANT
SHEATH PINNACLE 5F 10CM (SHEATH) ×3 IMPLANT
SHEATH PINNACLE 7F 10CM (SHEATH) IMPLANT
SYR MEDRAD MARK V 150ML (SYRINGE) ×3 IMPLANT
TRANSDUCER W/STOPCOCK (MISCELLANEOUS) ×6 IMPLANT
TUBING CIL FLEX 10 FLL-RA (TUBING) ×3 IMPLANT
WIRE EMERALD 3MM-J .035X150CM (WIRE) IMPLANT
WIRE EMERALD ST .035X150CM (WIRE) ×3 IMPLANT
WIRE SAFE-T 1.5MM-J .035X260CM (WIRE) ×3 IMPLANT
WIRE SWAN .025X260CM (WIRE) ×3 IMPLANT

## 2015-01-19 NOTE — Progress Notes (Signed)
Site area: rt ac venous sheath Site Prior to Removal:  Level 0 Pressure Applied For:  10 minutes Manual:   yes Patient Status During Pull:  stable Post Pull Site:  Level  0 Post Pull Instructions Given:  yes Post Pull Pulses Present: yes Dressing Applied:  Small tegaderm Bedrest begins @  0930 Comments:   

## 2015-01-19 NOTE — Progress Notes (Signed)
Site area: rt fa sheath Site Prior to Removal:  Level  0 Pressure Applied For:  20 minutes Manual:   yes Patient Status During Pull:  yes Post Pull Site:  Level  0 Post Pull Instructions Given:  yes Post Pull Pulses Present:  yes Dressing Applied:  tegaderm Bedrest begins @  4680 Comments: 0. RT ac level 0

## 2015-01-19 NOTE — Discharge Instructions (Signed)
Follow post radial and femoral cath instructions.  No lifting more than 10 lbs for a week.  Angiogram, Care After Refer to this sheet in the next few weeks. These instructions provide you with information on caring for yourself after your procedure. Your health care provider may also give you more specific instructions. Your treatment has been planned according to current medical practices, but problems sometimes occur. Call your health care provider if you have any problems or questions after your procedure.  WHAT TO EXPECT AFTER THE PROCEDURE After your procedure, it is typical to have the following sensations:  Minor discomfort or tenderness and a small bump at the catheter insertion site. The bump should usually decrease in size and tenderness within 1 to 2 weeks.  Any bruising will usually fade within 2 to 4 weeks. HOME CARE INSTRUCTIONS   You may need to keep taking blood thinners if they were prescribed for you. Take medicines only as directed by your health care provider.  Do not apply powder or lotion to the site.  Do not take baths, swim, or use a hot tub until your health care provider approves.  You may shower 24 hours after the procedure. Remove the bandage (dressing) and gently wash the site with plain soap and water. Gently pat the site dry.  Inspect the site at least twice daily.  Limit your activity for the first 48 hours. Do not bend, squat, or lift anything over 20 lb (9 kg) or as directed by your health care provider.  Plan to have someone take you home after the procedure. Follow instructions about when you can drive or return to work. SEEK MEDICAL CARE IF:  You get light-headed when standing up.  You have drainage (other than a small amount of blood on the dressing).  You have chills.  You have a fever.  You have redness, warmth, swelling, or pain at the insertion site. SEEK IMMEDIATE MEDICAL CARE IF:   You develop chest pain or shortness of breath, feel  faint, or pass out.  You have bleeding, swelling larger than a walnut, or drainage from the catheter insertion site.  You develop pain, discoloration, coldness, or severe bruising in the leg or arm that held the catheter.  You develop bleeding from any other place, such as the bowels. You may see bright red blood in your urine or stools, or your stools may appear black and tarry.  You have heavy bleeding from the site. If this happens, hold pressure on the site and call 911. MAKE SURE YOU:  Understand these instructions.  Will watch your condition.  Will get help right away if you are not doing well or get worse. Document Released: 01/06/2005 Document Revised: 11/04/2013 Document Reviewed: 11/12/2012 [facility] Patient Information 2015 [place], [place]. This information is not intended to replace advice given to you by your health care provider. Make sure you discuss any questions you have with your health care provider.   Radial Site Care Refer to this sheet in the next few weeks. These instructions provide you with information on caring for yourself after your procedure. Your caregiver may also give you more specific instructions. Your treatment has been planned according to current medical practices, but problems sometimes occur. Call your caregiver if you have any problems or questions after your procedure. HOME CARE INSTRUCTIONS  You may shower the day after the procedure.Remove the bandage (dressing) and gently wash the site with plain soap and water.Gently pat the site dry.  Do not  apply powder or lotion to the site.  Do not submerge the affected site in water for 3 to 5 days.  Inspect the site at least twice daily.  Do not flex or bend the affected arm for 24 hours.  No lifting over 5 pounds (2.3 kg) for 5 days after your procedure.  Do not drive home if you are discharged the same day of the procedure. Have someone else drive you.  You may drive 24 hours after the  procedure unless otherwise instructed by your caregiver.  Do not operate machinery or power tools for 24 hours.  A responsible adult should be with you for the first 24 hours after you arrive home. What to expect:  Any bruising will usually fade within 1 to 2 weeks.  Blood that collects in the tissue (hematoma) may be painful to the touch. It should usually decrease in size and tenderness within 1 to 2 weeks. SEEK IMMEDIATE MEDICAL CARE IF:  You have unusual pain at the radial site.  You have redness, warmth, swelling, or pain at the radial site.  You have drainage (other than a small amount of blood on the dressing).  You have chills.  You have a fever or persistent symptoms for more than 72 hours.  You have a fever and your symptoms suddenly get worse.  Your arm becomes pale, cool, tingly, or numb.  You have heavy bleeding from the site. Hold pressure on the site and call 911. Document Released: 07/23/2010 Document Revised: 09/12/2011 Document Reviewed: 07/23/2010 [facility] Patient Information 2015 [place], [place]. This information is not intended to replace advice given to you by your health care provider. Make sure you discuss any questions you have with your health care provider.

## 2015-01-19 NOTE — Progress Notes (Signed)
Rt ac venous sheath-level 0

## 2015-01-20 ENCOUNTER — Other Ambulatory Visit (HOSPITAL_COMMUNITY): Payer: Self-pay | Admitting: Neurosurgery

## 2015-01-20 DIAGNOSIS — D352 Benign neoplasm of pituitary gland: Secondary | ICD-10-CM

## 2015-01-20 MED FILL — Lidocaine HCl Local Preservative Free (PF) Inj 1%: INTRAMUSCULAR | Qty: 30 | Status: AC

## 2015-01-20 MED FILL — Heparin Sodium (Porcine) 2 Unit/ML in Sodium Chloride 0.9%: INTRAMUSCULAR | Qty: 1500 | Status: AC

## 2015-01-20 MED FILL — Nitroglycerin IV Soln 100 MCG/ML in D5W: INTRA_ARTERIAL | Qty: 10 | Status: AC

## 2015-01-21 ENCOUNTER — Ambulatory Visit (HOSPITAL_COMMUNITY)
Admission: RE | Admit: 2015-01-21 | Discharge: 2015-01-21 | Disposition: A | Discharge status: Home / Self Care | Payer: Medicare Other | Source: Ambulatory Visit | Attending: Cardiovascular Disease | Admitting: Cardiovascular Disease

## 2015-01-21 DIAGNOSIS — R079 Chest pain, unspecified: Secondary | ICD-10-CM | POA: Diagnosis not present

## 2015-01-21 DIAGNOSIS — R0609 Other forms of dyspnea: Secondary | ICD-10-CM

## 2015-01-21 DIAGNOSIS — I209 Angina pectoris, unspecified: Secondary | ICD-10-CM

## 2015-01-30 ENCOUNTER — Ambulatory Visit (HOSPITAL_COMMUNITY)
Admission: RE | Admit: 2015-01-30 | Discharge: 2015-01-30 | Disposition: A | Discharge status: Home / Self Care | Payer: Medicare Other | Source: Ambulatory Visit | Attending: Neurosurgery | Admitting: Neurosurgery

## 2015-01-30 DIAGNOSIS — H539 Unspecified visual disturbance: Secondary | ICD-10-CM | POA: Insufficient documentation

## 2015-01-30 DIAGNOSIS — D352 Benign neoplasm of pituitary gland: Secondary | ICD-10-CM | POA: Insufficient documentation

## 2015-01-30 DIAGNOSIS — R42 Dizziness and giddiness: Secondary | ICD-10-CM | POA: Diagnosis not present

## 2015-01-30 MED ORDER — GADOBENATE DIMEGLUMINE 529 MG/ML IV SOLN
6.0000 mL | Freq: Once | INTRAVENOUS | Status: AC | PRN
  Administered 2015-01-30: 6 mL via INTRAVENOUS

## 2015-02-03 DIAGNOSIS — I10 Essential (primary) hypertension: Secondary | ICD-10-CM | POA: Diagnosis not present

## 2015-02-03 DIAGNOSIS — D352 Benign neoplasm of pituitary gland: Secondary | ICD-10-CM | POA: Diagnosis not present

## 2015-02-05 DIAGNOSIS — L820 Inflamed seborrheic keratosis: Secondary | ICD-10-CM | POA: Diagnosis not present

## 2015-02-05 DIAGNOSIS — L219 Seborrheic dermatitis, unspecified: Secondary | ICD-10-CM | POA: Diagnosis not present

## 2015-02-05 DIAGNOSIS — D485 Neoplasm of uncertain behavior of skin: Secondary | ICD-10-CM | POA: Diagnosis not present

## 2015-02-05 DIAGNOSIS — L821 Other seborrheic keratosis: Secondary | ICD-10-CM | POA: Diagnosis not present

## 2015-02-18 ENCOUNTER — Telehealth: Payer: Self-pay | Admitting: Cardiovascular Disease

## 2015-02-18 ENCOUNTER — Ambulatory Visit: Payer: Medicare Other | Admitting: Cardiovascular Disease

## 2015-02-18 NOTE — Telephone Encounter (Signed)
Patient would like to know when she is due to see Dr Bronson Ing again

## 2015-02-18 NOTE — Telephone Encounter (Signed)
Left message to return call 

## 2015-02-18 NOTE — Telephone Encounter (Signed)
Scheduled OV with Dr. Bronson Ing for Friday, 02/27/2015.

## 2015-02-27 ENCOUNTER — Ambulatory Visit (INDEPENDENT_AMBULATORY_CARE_PROVIDER_SITE_OTHER): Payer: Medicare Other | Admitting: Cardiovascular Disease

## 2015-02-27 ENCOUNTER — Encounter: Payer: Self-pay | Admitting: Cardiovascular Disease

## 2015-02-27 VITALS — BP 138/78 | HR 66 | Ht 67.0 in | Wt 134.0 lb

## 2015-02-27 DIAGNOSIS — R079 Chest pain, unspecified: Secondary | ICD-10-CM

## 2015-02-27 DIAGNOSIS — I10 Essential (primary) hypertension: Secondary | ICD-10-CM | POA: Diagnosis not present

## 2015-02-27 DIAGNOSIS — R0602 Shortness of breath: Secondary | ICD-10-CM

## 2015-02-27 DIAGNOSIS — Z87891 Personal history of nicotine dependence: Secondary | ICD-10-CM

## 2015-02-27 DIAGNOSIS — E785 Hyperlipidemia, unspecified: Secondary | ICD-10-CM

## 2015-02-27 DIAGNOSIS — I2510 Atherosclerotic heart disease of native coronary artery without angina pectoris: Secondary | ICD-10-CM

## 2015-02-27 DIAGNOSIS — I6523 Occlusion and stenosis of bilateral carotid arteries: Secondary | ICD-10-CM

## 2015-02-27 DIAGNOSIS — K449 Diaphragmatic hernia without obstruction or gangrene: Secondary | ICD-10-CM

## 2015-02-27 NOTE — Patient Instructions (Signed)
Your physician recommends that you continue on your current medications as directed. Please refer to the Current Medication list given to you today. Your physician has recommended that you have a pulmonary function test. Pulmonary Function Tests are a group of tests that measure how well air moves in and out of your lungs. You have been referred to a pulmonologist. Your physician recommends that you schedule a follow-up appointment in: 1 year. You will receive a reminder letter in the mail in about 10 months reminding you to call and schedule your appointment. If you don't receive this letter, please contact our office.

## 2015-02-27 NOTE — Progress Notes (Signed)
Patient ID: Victoria Short, female   DOB: 1925-03-07, 81 y.o.   MRN: [id number]      SUBJECTIVE: The patient presents for follow-up after undergoing right and left heart catheterization and coronary angiography. This demonstrated normal right heart pressures, cardiac output , and cardiac index. The LAD stent was patent. There was mild aortic stenosis with a mean gradient of 10 mmHg. Left ventricle systolic function was normal. It was suggested that she may need a pulmonary workup for her hypoxemia. Resting oxygen saturation was 87%.  Echocardiogram on 01/21/15 demonstrated vigorous left ventricular systolic function, LVEF 32-20% , grade 1 diastolic dysfunction and mildly sclerotic aortic valve with mild aortic regurgitation with no mention of stenosis. There was significant basal septal hypertrophy and systolic anterior motion of the mitral apparatus.  Continues to have chest pain and shortness of breath. Has large hiatal hernia.   Review of Systems: As per "subjective", otherwise negative.  Allergies  Allergen Reactions  . Brilinta [Ticagrelor] Nausea And Vomiting and Other (See Comments)    dyspnea  . Iodine Nausea And Vomiting  . Shellfish Allergy Nausea And Vomiting and Other (See Comments)    Unsteady gait   . Streptomycin Nausea And Vomiting  . Iodinated Diagnostic Agents Nausea Only    Current Outpatient Prescriptions  Medication Sig Dispense Refill  . amLODipine (NORVASC) 5 MG tablet Take 1 tablet (5 mg total) by mouth daily. (Patient taking differently: Take 5 mg by mouth at bedtime. ) 180 tablet 3  . Ascorbic Acid (VITAMIN C) 500 MG CAPS Take 500 mg by mouth daily.     Marland Kitchen aspirin EC 81 MG tablet Take 81 mg by mouth daily.    . Benzocaine-Benzethonium (LANACANE ANTI-BACTERIAL EX) Apply 1 application topically daily as needed (rash).     . diazepam (VALIUM) 5 MG tablet Take 5 mg by mouth every 6 (six) hours as needed for anxiety.    . docusate sodium (COLACE) 100 MG capsule Take  1 capsule (100 mg total) by mouth 2 (two) times daily. 1 capsule 0  . fluticasone (FLONASE) 50 MCG/ACT nasal spray Place 1 spray into both nostrils daily as needed for allergies or rhinitis (congestion).    . hydrocortisone cream 1 % Apply 1 application topically daily as needed for itching (rash).    . metoprolol succinate (TOPROL-XL) 25 MG 24 hr tablet TAKE ONE TABLET BY MOUTH ONCE EVERY DAY 30 tablet 5  . nitroGLYCERIN (NITROSTAT) 0.4 MG SL tablet Place 1 tablet (0.4 mg total) under the tongue every 5 (five) minutes as needed for chest pain. 25 tablet 3  . pantoprazole (PROTONIX) 40 MG tablet TAKE 1 TABLET (40 MG TOTAL) BY MOUTH DAILY BEFORE BREAKFAST. 30 tablet 4  . simvastatin (ZOCOR) 40 MG tablet TAKE 1 TABLET (40 MG TOTAL) BY MOUTH AT BEDTIME. 30 tablet 3  . terbinafine (LAMISIL) 1 % cream Apply 1 application topically daily as needed (rash).     . triamcinolone lotion (KENALOG) 0.1 % Apply 1 application topically 3 (three) times daily as needed (rash).      No current facility-administered medications for this visit.    Past Medical History  Diagnosis Date  . Hypertension   . Sleep apnea   . Blood transfusion   . GERD (gastroesophageal reflux disease)   . Pneumonia   . Anemia   . Stress-induced cardiomyopathy 05/2011    a. ?Takotubso cardiomyopathy - distribution was different than that of LAD stenosis.  . Hyperlipidemia   . Coronary artery disease  a. NSTEMI 05/2011 s/p DES to LAD, with ?Takotsubo distribution of cardiomyopathy at that time with cardiogenic shock. b. NSTEMI 06/2011 ?spasm - stable CAD, normal EF. c. Patent LAD stent, nonobst CAD by cath 08/20/12 with normal EF. d. Hx dyspnea with Brilinta.  . Hypertrophic cardiomyopathy     a. echo 08/2012: focal basal septal hypertrophy, mildly elevated gradient 58mmHg across LVOT.  . Iron deficiency     a. Hx iron infusion.  . Nasal polyp   . Carotid artery disease     a. 5-57% RICA, 32-20% LICA 08/5425.  . Cardiogenic  shock     a. 05/2011 requiring pressor therapy.  . Mitral regurgitation   . Cancer     Skin    Past Surgical History  Procedure Laterality Date  . Appendectomy    . Partial hysterectomy    . Abdominal hysterectomy    . Wisdom tooth extraction    . Cardiac catheterization  05/2011    80% mid LAD, 40% LCX. EF: 35-40%.   . Coronary angioplasty with stent placement  05/2011    LAD: 2.5 X 16 mm Promus DES  . Cardiac catheterization  06/22/2011    patent LAD stent. Normal EF  . Cardiac catheterization  08/2012    Patent LAD stent with normal ejection fraction.  [name] capsule study N/A 05/12/2014    Procedure: GIVENS CAPSULE STUDY;  Surgeon: Rogene Houston, MD;  [facility];  Service: Endoscopy;  Laterality: N/A;  730  . Esophagogastroduodenoscopy N/A 05/23/2014    Procedure: ESOPHAGOGASTRODUODENOSCOPY (EGD);  Surgeon: Rogene Houston, MD;  Location: [facility];  Service: Endoscopy;  Laterality: N/A;  230 - moved to 1:05 - Ann notified pt  . Left heart catheterization with coronary angiogram N/A 05/12/2011    Procedure: LEFT HEART CATHETERIZATION WITH CORONARY ANGIOGRAM;  Surgeon: Troy Sine, MD;  Location: [facility] CATH LAB;  Service: Cardiovascular;  Laterality: N/A;  . Percutaneous coronary stent intervention (pci-s)  05/12/2011    Procedure: PERCUTANEOUS CORONARY STENT INTERVENTION (PCI-S);  Surgeon: Troy Sine, MD;  Location: [facility] CATH LAB;  Service: Cardiovascular;;  . Left heart catheterization with coronary angiogram N/A 06/22/2011    Procedure: LEFT HEART CATHETERIZATION WITH CORONARY ANGIOGRAM;  Surgeon: Wellington Hampshire, MD;  Location: [facility] CATH LAB;  Service: Cardiovascular;  Laterality: N/A;  . Left heart catheterization with coronary angiogram N/A 08/20/2012    Procedure: LEFT HEART CATHETERIZATION WITH CORONARY ANGIOGRAM;  Surgeon: Burnell Blanks, MD;  Location: [facility] CATH LAB;  Service: Cardiovascular;  Laterality: N/A;  . Cardiac catheterization N/A  01/19/2015    Procedure: Right/Left Heart Cath and Coronary Angiography;  Surgeon: Jettie Booze, MD;  Location: [place] CV LAB;  Service: Cardiovascular;  Laterality: N/A;    Social History   Social History  . Marital Status: Married    Spouse Name: N/A  . Number of Children: N/A  . Years of Education: N/A   Occupational History  . Not on file.   Social History Main Topics  . Smoking status: Former Smoker -- 1.00 packs/day for 18 years    Types: Cigarettes    Quit date: 05/11/1979  . Smokeless tobacco: Never Used  . Alcohol Use: No  . Drug Use: No  . Sexual Activity: Yes    Birth Control/ Protection: Post-menopausal   Other Topics Concern  . Not on file   Social History Narrative     Filed Vitals:   02/27/15 1528  BP: 138/78  Pulse: 66  Height: 5\' 7"  (1.702 m)  Weight: 134 lb (60.782 kg)  SpO2: 94%    PHYSICAL EXAM General: NAD  Neck: No JVD, no thyromegaly or thyroid nodule.  Lungs: Dry crackles at bases. CV: Nondisplaced PMI. Heart regular S1/S2, no S3/S4, I/VI soft pansystolic murmur. No peripheral edema.  Abdomen: Soft, no distention.  Neurologic: Alert and oriented x 3.  Psych: Normal affect.  Extremities: No clubbing or cyanosis.   ECG: Most recent ECG reviewed.    ASSESSMENT AND PLAN: 1. Chest pain and shortness of breath with hypoxemia in context of CAD and prior LAD stent: Non-cardiac etiology of symptoms. Patent LAD stent. I will obtain PFT's and make a pulmonary referral. Quit smoking over 20 yrs ago. Continue ASA, beta blocker, and statin.  If pulmonary work up is normal, I suspect symptoms are in fact related to her large hiatal hernia.  2. Essential HTN: Controlled on amlodipine 5 mg daily. No changes.  3. Hyperlipidemia: Managed by Dr. Pleas Koch (PCP).   4. Carotid artery stenosis: Mild bilaterally (1-39%) in June 2014. Will repeat Dopplers at next visit.  5. Hiatal hernia: Followed by GI.  6. Aortic valve disease: Mild  stenosis by cath with mean gradient 10 mmHg. No stenosis by echo with mild regurgitation noted. Will monitor.  Dispo: f/u 1 year.  [name], M.D., F.A.C.C.

## 2015-03-04 ENCOUNTER — Ambulatory Visit (HOSPITAL_COMMUNITY)
Admission: RE | Admit: 2015-03-04 | Discharge: 2015-03-04 | Disposition: A | Discharge status: Home / Self Care | Payer: Medicare Other | Source: Ambulatory Visit | Attending: Cardiovascular Disease | Admitting: Cardiovascular Disease

## 2015-03-04 DIAGNOSIS — R0600 Dyspnea, unspecified: Secondary | ICD-10-CM | POA: Diagnosis not present

## 2015-03-04 DIAGNOSIS — R0602 Shortness of breath: Secondary | ICD-10-CM

## 2015-03-04 DIAGNOSIS — Z87891 Personal history of nicotine dependence: Secondary | ICD-10-CM | POA: Insufficient documentation

## 2015-03-04 LAB — PULMONARY FUNCTION TEST
DL/VA % pred: 86 %
DL/VA: 4.38 ml/min/mmHg/L
DLCO unc % pred: 44 %
DLCO unc: 11.95 ml/min/mmHg
FEF 25-75 Post: 1.93 L/sec
FEF 25-75 Pre: 1.56 L/sec
FEF2575-%CHANGE-POST: 23 %
FEF2575-%PRED-POST: 172 %
FEF2575-%Pred-Pre: 139 %
FEV1-%CHANGE-POST: 2 %
FEV1-%PRED-PRE: 78 %
FEV1-%Pred-Post: 80 %
FEV1-POST: 1.48 L
FEV1-Pre: 1.44 L
FEV1FVC-%Change-Post: 0 %
FEV1FVC-%PRED-PRE: 116 %
FEV6-%Change-Post: 0 %
FEV6-%Pred-Post: 73 %
FEV6-%Pred-Pre: 73 %
FEV6-Post: 1.73 L
FEV6-Pre: 1.72 L
FEV6FVC-%Pred-Post: 106 %
FEV6FVC-%Pred-Pre: 106 %
FVC-%Change-Post: 2 %
FVC-%PRED-POST: 70 %
FVC-%PRED-PRE: 68 %
FVC-POST: 1.75 L
FVC-Pre: 1.72 L
POST FEV1/FVC RATIO: 85 %
Post FEV6/FVC ratio: 100 %
Pre FEV1/FVC ratio: 84 %
Pre FEV6/FVC Ratio: 100 %
RV % PRED: 84 %
RV: 2.25 L
TLC % pred: 73 %
TLC: 3.96 L

## 2015-03-04 MED ORDER — ALBUTEROL SULFATE (2.5 MG/3ML) 0.083% IN NEBU
2.5000 mg | INHALATION_SOLUTION | Freq: Once | RESPIRATORY_TRACT | Status: AC
  Administered 2015-03-04: 2.5 mg via RESPIRATORY_TRACT

## 2015-03-05 ENCOUNTER — Telehealth: Payer: Self-pay | Admitting: *Deleted

## 2015-03-05 NOTE — Telephone Encounter (Signed)
-----   Message from Herminio Commons, MD sent at 03/04/2015  4:26 PM EDT ----- Needs pulmonary referral.

## 2015-03-05 NOTE — Telephone Encounter (Signed)
Notes Recorded by Laurine Blazer, LPN on 08/05/1171 at 5:67 PM Patient notified. Copy fwd to pmd. Referral to Pulmonology has already been ordered & info faxed to Dr. Luan Pulling today by our schedulers.

## 2015-03-19 DIAGNOSIS — K449 Diaphragmatic hernia without obstruction or gangrene: Secondary | ICD-10-CM | POA: Diagnosis not present

## 2015-03-19 DIAGNOSIS — R0602 Shortness of breath: Secondary | ICD-10-CM | POA: Diagnosis not present

## 2015-03-19 DIAGNOSIS — I10 Essential (primary) hypertension: Secondary | ICD-10-CM | POA: Diagnosis not present

## 2015-03-24 DIAGNOSIS — Z23 Encounter for immunization: Secondary | ICD-10-CM | POA: Diagnosis not present

## 2015-04-15 ENCOUNTER — Other Ambulatory Visit: Payer: Self-pay | Admitting: Cardiovascular Disease

## 2015-04-15 ENCOUNTER — Encounter (INDEPENDENT_AMBULATORY_CARE_PROVIDER_SITE_OTHER): Payer: Self-pay | Admitting: Internal Medicine

## 2015-04-15 ENCOUNTER — Ambulatory Visit (INDEPENDENT_AMBULATORY_CARE_PROVIDER_SITE_OTHER): Payer: Medicare Other | Admitting: Internal Medicine

## 2015-04-15 VITALS — BP 130/58 | HR 64 | Temp 98.2°F | Ht 66.0 in | Wt 134.5 lb

## 2015-04-15 DIAGNOSIS — K317 Polyp of stomach and duodenum: Secondary | ICD-10-CM | POA: Diagnosis not present

## 2015-04-15 DIAGNOSIS — K449 Diaphragmatic hernia without obstruction or gangrene: Secondary | ICD-10-CM | POA: Diagnosis not present

## 2015-04-15 DIAGNOSIS — I6523 Occlusion and stenosis of bilateral carotid arteries: Secondary | ICD-10-CM | POA: Diagnosis not present

## 2015-04-15 NOTE — Patient Instructions (Signed)
Will discuss with Dr.Rehman. 

## 2015-04-15 NOTE — Progress Notes (Addendum)
Subjective:    Patient ID: Victoria Short, female    DOB: 27-Dec-1924, 81 y.o.   MRN: [id number]  HPI Here today for f/u. She was last seen by Dr. Laural Golden in June. She tells me some days are good and some are not.  She continues to have some SOB. Has been evaluated by Dr. Luan Pulling and underwent a PFT: Spirometry shows mild ventilatory defect without airflow obstruction, no significant bronchodilator improvement. Lung volumes show mild restrictive change. Airway resistance is normal. DLCO is severely reduced, but corrects when volume is accounted for. This is consistent with mild restrictive disease.  Appetite is fair. She cannot eat large amts. She drinks Boost and eats a snack. She eats 6 times a day instead of three times. If she eats a large amt she will become SOB.  She has BM every 3-4 days with an enema. She does eat prunes. She wants her hernia repaired. She says if she does anything she becomes SOB. Has been evaluated by Cardiology. Chest pain and SOB felt to be non-cardiac etiology.  Hx of H. Pylori with treatment    Cardiac cath 01/19/2015    Normal right heart pressures. CO 4.5 L/min. CI 2.8.  Patent stent in the LAD.  Resting oxygen saturation 87%.  Mild aortic stenosis. 10 mm Hg gradiant across the valve.  Overall normal left ventricular function.  LVEDP 15 mm Hg.  The left ventricular systolic function is normal.  No infrarenal AAA. No significant right iliac artery disease.  If cardiac cath is needed in the future, would not attempt right radial access due to severe tortuosity in the right subclavian.  Consider pulmonary workup to evaluate why she is hypoxemic. THis may be the cuase of her shortness of breath.  If cardiac cath is needed in the future, would not attempt right radial access due to severe tortuosity in the right               Show images for NM Myocar Multi W/Spect W/Wall Motion / EF   11/25/2014        There was no ST segment deviation  noted during stress.  This is a low risk study.  No significant perfusion defects to indicate scar or ischemia. LVEF 72%.         05/24/2015: EGD  Indications: Patient is 81 year old Caucasian female with history of iron deficiency anemia secondary to chronic occult GI bleed. Initial workup was 2 years ago at [facility] and no bleeding lesion found on EGD and colonoscopy. Recent given capsule study revealed gastric erosions covered with specks of fresh blood and gastric AVM. She is therefore returning for EGD with therapeutic intervention.  Impression: Large sliding hiatal hernia with focal gastritis at the level of hiatus. Erosive gastritis. Biopsy taken. Duodenitis with post bulbar noncritical narrowing. Large broad-based polyp involving second part of the duodenum. Piecemeal polypectomy attempted. 10 mm piece snared and most of the residual polyp was coagulated with snare tip. Polypectomy incomplete. Biopsy: There is adenomatous epithelium having predominantly tubular growth pattern consistent with tubular adenoma if the biopsy is representative of the entire lesion. No high grade dysplasia or evidence of malignancy identified. H. Pylori positive. Review of Systems Past Medical History  Diagnosis Date  . Hypertension   . Sleep apnea   . Blood transfusion   . GERD (gastroesophageal reflux disease)   . Pneumonia   . Anemia   . Stress-induced cardiomyopathy 05/2011    a. ?Takotubso cardiomyopathy - distribution was different than  that of LAD stenosis.  . Hyperlipidemia   . Coronary artery disease     a. NSTEMI 05/2011 s/p DES to LAD, with ?Takotsubo distribution of cardiomyopathy at that time with cardiogenic shock. b. NSTEMI 06/2011 ?spasm - stable CAD, normal EF. c. Patent LAD stent, nonobst CAD by cath 08/20/12 with normal EF. d. Hx dyspnea with Brilinta.  .  Hypertrophic cardiomyopathy ([place])     a. echo 08/2012: focal basal septal hypertrophy, mildly elevated gradient 61mmHg across LVOT.  . Iron deficiency     a. Hx iron infusion.  . Nasal polyp   . Carotid artery disease ([facility])     a. 6-22% RICA, 29-79% LICA 02/9210.  . Cardiogenic shock ([place])     a. 05/2011 requiring pressor therapy.  . Mitral regurgitation   . Cancer [facility])     Skin    Past Surgical History  Procedure Laterality Date  . Appendectomy    . Partial hysterectomy    . Abdominal hysterectomy    . Wisdom tooth extraction    . Cardiac catheterization  05/2011    80% mid LAD, 40% LCX. EF: 35-40%.   . Coronary angioplasty with stent placement  05/2011    LAD: 2.5 X 16 mm Promus DES  . Cardiac catheterization  06/22/2011    patent LAD stent. Normal EF  . Cardiac catheterization  08/2012    Patent LAD stent with normal ejection fraction.  [name] capsule study N/A 05/12/2014    Procedure: GIVENS CAPSULE STUDY;  Surgeon: Rogene Houston, MD;  [facility];  Service: Endoscopy;  Laterality: N/A;  730  . Esophagogastroduodenoscopy N/A 05/23/2014    Procedure: ESOPHAGOGASTRODUODENOSCOPY (EGD);  Surgeon: Rogene Houston, MD;  [facility];  Service: Endoscopy;  Laterality: N/A;  230 - moved to 1:05 - Ann notified pt  . Left heart catheterization with coronary angiogram N/A 05/12/2011    Procedure: LEFT HEART CATHETERIZATION WITH CORONARY ANGIOGRAM;  Surgeon: Troy Sine, MD;  Location: [facility] CATH LAB;  Service: Cardiovascular;  Laterality: N/A;  . Percutaneous coronary stent intervention (pci-s)  05/12/2011    Procedure: PERCUTANEOUS CORONARY STENT INTERVENTION (PCI-S);  Surgeon: Troy Sine, MD;  Location: [facility] CATH LAB;  Service: Cardiovascular;;  . Left heart catheterization with coronary angiogram N/A 06/22/2011    Procedure: LEFT HEART CATHETERIZATION WITH CORONARY ANGIOGRAM;  Surgeon: Wellington Hampshire, MD;  Location: [facility] CATH LAB;  Service: Cardiovascular;   Laterality: N/A;  . Left heart catheterization with coronary angiogram N/A 08/20/2012    Procedure: LEFT HEART CATHETERIZATION WITH CORONARY ANGIOGRAM;  Surgeon: Burnell Blanks, MD;  Location: [facility] CATH LAB;  Service: Cardiovascular;  Laterality: N/A;  . Cardiac catheterization N/A 01/19/2015    Procedure: Right/Left Heart Cath and Coronary Angiography;  Surgeon: Jettie Booze, MD;  Location: [place] CV LAB;  Service: Cardiovascular;  Laterality: N/A;    Allergies  Allergen Reactions  . Brilinta [Ticagrelor] Nausea And Vomiting and Other (See Comments)    dyspnea  . Iodine Nausea And Vomiting  . Shellfish Allergy Nausea And Vomiting and Other (See Comments)    Unsteady gait   . Streptomycin Nausea And Vomiting  . Iodinated Diagnostic Agents Nausea Only    Current Outpatient Prescriptions on File Prior to Visit  Medication Sig Dispense Refill  . amLODipine (NORVASC) 5 MG tablet Take 1 tablet (5 mg total) by mouth daily. (Patient taking differently: Take 5 mg by mouth at bedtime. ) 180 tablet 3  .  Ascorbic Acid (VITAMIN C) 500 MG CAPS Take 500 mg by mouth daily.     Marland Kitchen aspirin EC 81 MG tablet Take 81 mg by mouth daily.    . Benzocaine-Benzethonium (LANACANE ANTI-BACTERIAL EX) Apply 1 application topically daily as needed (rash).     . diazepam (VALIUM) 5 MG tablet Take 5 mg by mouth every 6 (six) hours as needed for anxiety.    . docusate sodium (COLACE) 100 MG capsule Take 1 capsule (100 mg total) by mouth 2 (two) times daily. 1 capsule 0  . fluticasone (FLONASE) 50 MCG/ACT nasal spray Place 1 spray into both nostrils daily as needed for allergies or rhinitis (congestion).    . hydrocortisone cream 1 % Apply 1 application topically daily as needed for itching (rash).    . metoprolol succinate (TOPROL-XL) 25 MG 24 hr tablet TAKE ONE TABLET BY MOUTH ONCE EVERY DAY 30 tablet 5  . nitroGLYCERIN (NITROSTAT) 0.4 MG SL tablet Place 1 tablet (0.4 mg total) under the tongue every 5  (five) minutes as needed for chest pain. 25 tablet 3  . pantoprazole (PROTONIX) 40 MG tablet TAKE 1 TABLET (40 MG TOTAL) BY MOUTH DAILY BEFORE BREAKFAST. 30 tablet 4  . simvastatin (ZOCOR) 40 MG tablet TAKE 1 TABLET (40 MG TOTAL) BY MOUTH AT BEDTIME. 30 tablet 3  . terbinafine (LAMISIL) 1 % cream Apply 1 application topically daily as needed (rash).     . triamcinolone lotion (KENALOG) 0.1 % Apply 1 application topically 3 (three) times daily as needed (rash).      No current facility-administered medications on file prior to visit.        Objective:   Physical Exam Blood pressure 130/58, pulse 64, temperature 98.2 F (36.8 C), height 5\' 6"  (1.676 m), weight 134 lb 8 oz (61.009 kg). Alert and oriented. Skin warm and dry. Oral mucosa is moist.   . Sclera anicteric, conjunctivae is pink. Thyroid not enlarged. No cervical lymphadenopathy. Lungs clear. Heart regular rate and rhythm.  Abdomen is soft. Bowel sounds are positive. No hepatomegaly. No abdominal masses felt. No tenderness.  No edema to lower extremities.          Assessment & Plan:  Gastric polyp which was not completed removed in November of 2015.  Large Hiatal hernia. I will discuss with Dr. Laural Golden

## 2015-04-16 ENCOUNTER — Other Ambulatory Visit (INDEPENDENT_AMBULATORY_CARE_PROVIDER_SITE_OTHER): Payer: Self-pay | Admitting: Internal Medicine

## 2015-04-16 ENCOUNTER — Telehealth (INDEPENDENT_AMBULATORY_CARE_PROVIDER_SITE_OTHER): Payer: Self-pay | Admitting: *Deleted

## 2015-04-16 ENCOUNTER — Encounter (INDEPENDENT_AMBULATORY_CARE_PROVIDER_SITE_OTHER): Payer: Self-pay | Admitting: *Deleted

## 2015-04-16 DIAGNOSIS — D132 Benign neoplasm of duodenum: Secondary | ICD-10-CM

## 2015-04-16 NOTE — Telephone Encounter (Signed)
Ann, EGD with polypectomy

## 2015-04-16 NOTE — Telephone Encounter (Signed)
Patient states that she was to talk with Terri about an appointment she needs.. She will be away from her phone until 3 pm today , the patient states that she will call our office back then or tomorrow.

## 2015-04-16 NOTE — Telephone Encounter (Signed)
EGD sch'd 05/20/15, patient aware

## 2015-05-05 DIAGNOSIS — Z8719 Personal history of other diseases of the digestive system: Secondary | ICD-10-CM

## 2015-05-05 HISTORY — DX: Personal history of other diseases of the digestive system: Z87.19

## 2015-05-06 ENCOUNTER — Ambulatory Visit (INDEPENDENT_AMBULATORY_CARE_PROVIDER_SITE_OTHER): Payer: Medicare Other

## 2015-05-06 DIAGNOSIS — I6523 Occlusion and stenosis of bilateral carotid arteries: Secondary | ICD-10-CM | POA: Diagnosis not present

## 2015-05-08 ENCOUNTER — Telehealth: Payer: Self-pay | Admitting: *Deleted

## 2015-05-08 NOTE — Telephone Encounter (Signed)
Notes Recorded by Laurine Blazer, LPN on 79/07/5054 at 9:79 PM Patient notified. Copy fwd to pmd.  Notes Recorded by Herminio Commons, MD on 05/07/2015 at 10:50 AM No need to repeat.  Notes Recorded by Wellington Hampshire, MD on 05/06/2015 at 9:43 PM Victoria Short, This patient follows up with you now. I guess carotid doppler was ordered as a follow up to her previous one. Still with minimal disease. Considering her age, I don't think she needs further follow up.

## 2015-05-20 ENCOUNTER — Encounter (HOSPITAL_COMMUNITY): Payer: Self-pay | Admitting: *Deleted

## 2015-05-20 ENCOUNTER — Ambulatory Visit (HOSPITAL_COMMUNITY)
Admission: RE | Admit: 2015-05-20 | Discharge: 2015-05-20 | Disposition: A | Discharge status: Home / Self Care | Payer: Medicare Other | Source: Ambulatory Visit | Attending: Internal Medicine | Admitting: Internal Medicine

## 2015-05-20 ENCOUNTER — Encounter (HOSPITAL_COMMUNITY)
Admission: RE | Disposition: A | Discharge status: Home / Self Care | Payer: Self-pay | Source: Ambulatory Visit | Attending: Internal Medicine

## 2015-05-20 DIAGNOSIS — D132 Benign neoplasm of duodenum: Secondary | ICD-10-CM | POA: Insufficient documentation

## 2015-05-20 DIAGNOSIS — E785 Hyperlipidemia, unspecified: Secondary | ICD-10-CM | POA: Insufficient documentation

## 2015-05-20 DIAGNOSIS — Z7982 Long term (current) use of aspirin: Secondary | ICD-10-CM | POA: Diagnosis not present

## 2015-05-20 DIAGNOSIS — Z87891 Personal history of nicotine dependence: Secondary | ICD-10-CM | POA: Insufficient documentation

## 2015-05-20 DIAGNOSIS — K317 Polyp of stomach and duodenum: Secondary | ICD-10-CM | POA: Diagnosis present

## 2015-05-20 DIAGNOSIS — K219 Gastro-esophageal reflux disease without esophagitis: Secondary | ICD-10-CM | POA: Diagnosis not present

## 2015-05-20 DIAGNOSIS — K5710 Diverticulosis of small intestine without perforation or abscess without bleeding: Secondary | ICD-10-CM | POA: Diagnosis not present

## 2015-05-20 DIAGNOSIS — Z79899 Other long term (current) drug therapy: Secondary | ICD-10-CM | POA: Insufficient documentation

## 2015-05-20 DIAGNOSIS — G4730 Sleep apnea, unspecified: Secondary | ICD-10-CM | POA: Diagnosis not present

## 2015-05-20 DIAGNOSIS — I10 Essential (primary) hypertension: Secondary | ICD-10-CM | POA: Diagnosis not present

## 2015-05-20 DIAGNOSIS — I2510 Atherosclerotic heart disease of native coronary artery without angina pectoris: Secondary | ICD-10-CM | POA: Diagnosis not present

## 2015-05-20 DIAGNOSIS — K449 Diaphragmatic hernia without obstruction or gangrene: Secondary | ICD-10-CM | POA: Diagnosis not present

## 2015-05-20 HISTORY — PX: ESOPHAGOGASTRODUODENOSCOPY: SHX5428

## 2015-05-20 HISTORY — PX: POLYPECTOMY: SHX5525

## 2015-05-20 SURGERY — EGD (ESOPHAGOGASTRODUODENOSCOPY)
Anesthesia: Moderate Sedation

## 2015-05-20 MED ORDER — SODIUM CHLORIDE 0.9 % IV SOLN
INTRAVENOUS | Status: DC
  Administered 2015-05-20: 1000 mL via INTRAVENOUS

## 2015-05-20 MED ORDER — MIDAZOLAM HCL 5 MG/5ML IJ SOLN
INTRAMUSCULAR | Status: AC
  Filled 2015-05-20: qty 10

## 2015-05-20 MED ORDER — MIDAZOLAM HCL 5 MG/5ML IJ SOLN
INTRAMUSCULAR | Status: DC | PRN
  Administered 2015-05-20 (×3): 1 mg via INTRAVENOUS

## 2015-05-20 MED ORDER — MEPERIDINE HCL 50 MG/ML IJ SOLN
INTRAMUSCULAR | Status: DC | PRN
Start: 2015-05-20 — End: 2015-05-20
  Administered 2015-05-20: 25 mg via INTRAVENOUS

## 2015-05-20 MED ORDER — GLUCAGON HCL RDNA (DIAGNOSTIC) 1 MG IJ SOLR
INTRAMUSCULAR | Status: AC
  Filled 2015-05-20: qty 1

## 2015-05-20 MED ORDER — MEPERIDINE HCL 50 MG/ML IJ SOLN
INTRAMUSCULAR | Status: AC
  Filled 2015-05-20: qty 1

## 2015-05-20 MED ORDER — BUTAMBEN-TETRACAINE-BENZOCAINE 2-2-14 % EX AERO
INHALATION_SPRAY | CUTANEOUS | Status: DC | PRN
  Administered 2015-05-20: 2 via TOPICAL

## 2015-05-20 MED ORDER — GLUCAGON HCL (RDNA) 1 MG IJ SOLR
INTRAMUSCULAR | Status: DC | PRN
  Administered 2015-05-20 (×2): .5 mg via INTRAVENOUS

## 2015-05-20 NOTE — H&P (Signed)
Victoria Short is an 81 y.o. female.   Chief Complaint: Patient's here for EGD and possible polypectomy(if residual polyp found). HPI: She is 81 year old Asian female with multiple medical problems was evaluated 1 year ago for iron deficiency anemia and occult GI bleed. EGD revealed large hiatal hernia with H. pylori gastritis as well as duodenal adenoma. This polyp was removed piecemeal and residual polyp was treated with APC. She is returning for reevaluation to treat recurrent or residual polyp. She denies melena or rectal bleeding or epigastric pain. She has intermittent right rib cage pain. Since she eats 3 small meals she does not have this pain. Hemoglobin in July, 2016 was 15 g.  Past Medical History  Diagnosis Date  . Hypertension   . Sleep apnea   . Blood transfusion   . GERD (gastroesophageal reflux disease)   . Pneumonia   . Anemia   . Stress-induced cardiomyopathy 05/2011    a. ?Takotubso cardiomyopathy - distribution was different than that of LAD stenosis.  . Hyperlipidemia   . Coronary artery disease     a. NSTEMI 05/2011 s/p DES to LAD, with ?Takotsubo distribution of cardiomyopathy at that time with cardiogenic shock. b. NSTEMI 06/2011 ?spasm - stable CAD, normal EF. c. Patent LAD stent, nonobst CAD by cath 08/20/12 with normal EF. d. Hx dyspnea with Brilinta.  . Hypertrophic cardiomyopathy ([place])     a. echo 08/2012: focal basal septal hypertrophy, mildly elevated gradient 62mmHg across LVOT.  . Iron deficiency     a. Hx iron infusion.  . Nasal polyp   . Carotid artery disease ([facility])     a. [id number] RICA, [id number] LICA [id number].  . Cardiogenic shock ([place])     a. 05/2011 requiring pressor therapy.  . Mitral regurgitation   . Cancer [facility])     Skin    Past Surgical History  Procedure Laterality Date  . Appendectomy    . Partial hysterectomy    . Abdominal hysterectomy    . Wisdom tooth extraction    . Cardiac catheterization  05/2011    80% mid LAD, 40% LCX. EF: 35-40%.    . Coronary angioplasty with stent placement  05/2011    LAD: 2.5 X 16 mm Promus DES  . Cardiac catheterization  06/22/2011    patent LAD stent. Normal EF  . Cardiac catheterization  08/2012    Patent LAD stent with normal ejection fraction.  Victoria Short capsule study N/A 05/12/2014    Procedure: GIVENS CAPSULE STUDY;  Surgeon: Rogene Houston, MD;  Location[facility];  Service: Endoscopy;  Laterality: N/A;  730  . Esophagogastroduodenoscopy N/A 05/23/2014    Procedure: ESOPHAGOGASTRODUODENOSCOPY (EGD);  Surgeon: Rogene Houston, MD;  Location: [facility];  Service: Endoscopy;  Laterality: N/A;  230 - moved to 1:05 - Ann notified pt  . Left heart catheterization with coronary angiogram N/A 05/12/2011    Procedure: LEFT HEART CATHETERIZATION WITH CORONARY ANGIOGRAM;  Surgeon: Troy Sine, MD;  Location: [facility] CATH LAB;  Service: Cardiovascular;  Laterality: N/A;  . Percutaneous coronary stent intervention (pci-s)  05/12/2011    Procedure: PERCUTANEOUS CORONARY STENT INTERVENTION (PCI-S);  Surgeon: Troy Sine, MD;  Location: [facility] CATH LAB;  Service: Cardiovascular;;  . Left heart catheterization with coronary angiogram N/A 06/22/2011    Procedure: LEFT HEART CATHETERIZATION WITH CORONARY ANGIOGRAM;  Surgeon: Wellington Hampshire, MD;  Location: [place] CATH LAB;  Service: Cardiovascular;  Laterality: N/A;  . Left heart catheterization with coronary angiogram N/A 08/20/2012  Procedure: LEFT HEART CATHETERIZATION WITH CORONARY ANGIOGRAM;  Surgeon: Burnell Blanks, MD;  Location: [facility] CATH LAB;  Service: Cardiovascular;  Laterality: N/A;  . Cardiac catheterization N/A 01/19/2015    Procedure: Right/Left Heart Cath and Coronary Angiography;  Surgeon: Jettie Booze, MD;  Location: [place] CV LAB;  Service: Cardiovascular;  Laterality: N/A;    Family History  Problem Relation Age of Onset  . Heart attack Mother 59  . Diabetic kidney disease Father 28  . Prostate cancer Father    Social  History:  reports that she quit smoking about 36 years ago. Her smoking use included Cigarettes. She has a 18 pack-year smoking history. She has never used smokeless tobacco. She reports that she does not drink alcohol or use illicit drugs.  Allergies:  Allergies  Allergen Reactions  . Brilinta [Ticagrelor] Nausea And Vomiting and Other (See Comments)    dyspnea  . Iodine Nausea And Vomiting  . Shellfish Allergy Nausea And Vomiting and Other (See Comments)    Unsteady gait   . Streptomycin Nausea And Vomiting  . Iodinated Diagnostic Agents Nausea Only    Medications Prior to Admission  Medication Sig Dispense Refill  . amLODipine (NORVASC) 5 MG tablet Take 1 tablet (5 mg total) by mouth daily. (Patient taking differently: Take 5 mg by mouth at bedtime. ) 180 tablet 3  . Ascorbic Acid (VITAMIN C) 500 MG CAPS Take 500 mg by mouth daily.     Marland Kitchen aspirin EC 81 MG tablet Take 81 mg by mouth daily.    . diazepam (VALIUM) 5 MG tablet Take 2.5-5 mg by mouth every 6 (six) hours as needed for anxiety.     . docusate sodium (COLACE) 100 MG capsule Take 1 capsule (100 mg total) by mouth 2 (two) times daily. 1 capsule 0  . fluticasone (FLONASE) 50 MCG/ACT nasal spray Place 1 spray into both nostrils daily as needed for allergies or rhinitis (congestion).    . metoprolol succinate (TOPROL-XL) 25 MG 24 hr tablet TAKE ONE TABLET BY MOUTH ONCE EVERY DAY 30 tablet 5  . pantoprazole (PROTONIX) 40 MG tablet TAKE 1 TABLET (40 MG TOTAL) BY MOUTH DAILY BEFORE BREAKFAST. 30 tablet 4  . simvastatin (ZOCOR) 40 MG tablet TAKE 1 TABLET (40 MG TOTAL) BY MOUTH AT BEDTIME. 30 tablet 3  . triamcinolone lotion (KENALOG) 0.1 % Apply 1 application topically 3 (three) times daily as needed (rash).     . isosorbide dinitrate (ISORDIL) 30 MG tablet Take 30 mg by mouth 4 (four) times daily.    . metroNIDAZOLE (FLAGYL) 250 MG tablet Take 250 mg by mouth 3 (three) times daily.    . nitroGLYCERIN (NITROSTAT) 0.4 MG SL tablet Place  1 tablet (0.4 mg total) under the tongue every 5 (five) minutes as needed for chest pain. 25 tablet 3  . terbinafine (LAMISIL) 1 % cream Apply 1 application topically daily as needed (rash).       No results found for this or any previous visit (from the past 48 hour(s)). No results found.  ROS  Blood pressure 183/64, pulse 60, temperature 97.7 F (36.5 C), temperature source Oral, resp. rate 21, height 5\' 6"  (1.676 m), weight 134 lb (60.782 kg), SpO2 99 %. Physical Exam  Constitutional: She appears well-developed and well-nourished.  HENT:  Mouth/Throat: Oropharynx is clear and moist.  Eyes: Conjunctivae are normal. No scleral icterus.  Neck: No thyromegaly present.  Cardiovascular: Normal rate, regular rhythm and normal heart sounds.   Murmur:  grade 2/6 systolic ejection murmur best heard at apex and LLSB. Respiratory: Effort normal and breath sounds normal.  GI: Soft. Bowel sounds are normal. She exhibits no distension and no mass. There is no tenderness.  Musculoskeletal: She exhibits no edema.  Neurological: She is alert.  Skin: Skin is warm and dry.     Assessment/Plan History of duodenal adenoma. EGD with therapeutic intention.  Delayza Lungren U 05/20/2015, 12:30 PM

## 2015-05-20 NOTE — Discharge Instructions (Signed)
No aspirin or NSAIDs for 2 weeks. Resume other medications and diet as before. No driving for 24 hours. Physician will call with biopsy results.   Esophagogastroduodenoscopy, Care After Refer to this sheet in the next few weeks. These instructions provide you with information about caring for yourself after your procedure. Your health care provider may also give you more specific instructions. Your treatment has been planned according to current medical practices, but problems sometimes occur. Call your health care provider if you have any problems or questions after your procedure. WHAT TO EXPECT AFTER THE PROCEDURE After your procedure, it is typical to feel:  Soreness in your throat.  Pain with swallowing.  Sick to your stomach (nauseous).  Bloated.  Dizzy.  Fatigued. HOME CARE INSTRUCTIONS  Do not eat or drink anything until the numbing medicine (local anesthetic) has worn off and your gag reflex has returned. You will know that the local anesthetic has worn off when you can swallow comfortably.  Do not drive or operate machinery until directed by your health care provider.  Take medicines only as directed by your health care provider. SEEK MEDICAL CARE IF:   You cannot stop coughing.  You are not urinating at all or less than usual. SEEK IMMEDIATE MEDICAL CARE IF:  You have difficulty swallowing.  You cannot eat or drink.  You have worsening throat or chest pain.  You have dizziness or lightheadedness or you faint.  You have nausea or vomiting.  You have chills.  You have a fever.  You have severe abdominal pain.  You have black, tarry, or bloody stools.   This information is not intended to replace advice given to you by your health care provider. Make sure you discuss any questions you have with your health care provider.   Document Released: 06/06/2012 Document Revised: 07/11/2014 Document Reviewed: 06/06/2012 Elsevier Interactive Patient Education  2016 Elsevier Inc.     Colon Polyps Polyps are lumps of extra tissue growing inside the body. Polyps can grow in the large intestine (colon). Most colon polyps are noncancerous (benign). However, some colon polyps can become cancerous over time. Polyps that are larger than a pea may be harmful. To be safe, caregivers remove and test all polyps. CAUSES  Polyps form when mutations in the genes cause your cells to grow and divide even though no more tissue is needed. RISK FACTORS There are a number of risk factors that can increase your chances of getting colon polyps. They include:  Being older than 50 years.  Family history of colon polyps or colon cancer.  Long-term colon diseases, such as colitis or Crohn disease.  Being overweight.  Smoking.  Being inactive.  Drinking too much alcohol. SYMPTOMS  Most small polyps do not cause symptoms. If symptoms are present, they may include:  Blood in the stool. The stool may look dark red or black.  Constipation or diarrhea that lasts longer than 1 week. DIAGNOSIS People often do not know they have polyps until their caregiver finds them during a regular checkup. Your caregiver can use 4 tests to check for polyps:  Digital rectal exam. The caregiver wears gloves and feels inside the rectum. This test would find polyps only in the rectum.  Barium enema. The caregiver puts a liquid called barium into your rectum before taking X-rays of your colon. Barium makes your colon look white. Polyps are dark, so they are easy to see in the X-ray pictures.  Sigmoidoscopy. A thin, flexible tube (sigmoidoscope) is placed  into your rectum. The sigmoidoscope has a light and tiny camera in it. The caregiver uses the sigmoidoscope to look at the last third of your colon.  Colonoscopy. This test is like sigmoidoscopy, but the caregiver looks at the entire colon. This is the most common method for finding and removing polyps. TREATMENT  Any polyps will be  removed during a sigmoidoscopy or colonoscopy. The polyps are then tested for cancer. PREVENTION  To help lower your risk of getting more colon polyps:  Eat plenty of fruits and vegetables. Avoid eating fatty foods.  Do not smoke.  Avoid drinking alcohol.  Exercise every day.  Lose weight if recommended by your caregiver.  Eat plenty of calcium and folate. Foods that are rich in calcium include milk, cheese, and broccoli. Foods that are rich in folate include chickpeas, kidney beans, and spinach. HOME CARE INSTRUCTIONS Keep all follow-up appointments as directed by your caregiver. You may need periodic exams to check for polyps. SEEK MEDICAL CARE IF: You notice bleeding during a bowel movement.   This information is not intended to replace advice given to you by your health care provider. Make sure you discuss any questions you have with your health care provider.   Document Released: 03/16/2004 Document Revised: 07/11/2014 Document Reviewed: 08/30/2011 Elsevier Interactive Patient Education 2016 Elsevier Inc.    Hiatal Hernia A hiatal hernia occurs when part of your stomach slides above the muscle that separates your abdomen from your chest (diaphragm). You can be born with a hiatal hernia (congenital), or it may develop over time. In almost all cases of hiatal hernia, only the top part of the stomach pushes through.  Many people have a hiatal hernia with no symptoms. The larger the hernia, the more likely that you will have symptoms. In some cases, a hiatal hernia allows stomach acid to flow back into the tube that carries food from your mouth to your stomach (esophagus). This may cause heartburn symptoms. Severe heartburn symptoms may mean you have developed a condition called gastroesophageal reflux disease (GERD).  CAUSES  Hiatal hernias are caused by a weakness in the opening (hiatus) where your esophagus passes through your diaphragm to attach to the upper part of your stomach.  You may be born with a weakness in your hiatus, or a weakness can develop. RISK FACTORS Older age is a major risk factor for a hiatal hernia. Anything that increases pressure on your diaphragm can also increase your risk of a hiatal hernia. This includes:  Pregnancy.  Excess weight.  Frequent constipation. SIGNS AND SYMPTOMS  People with a hiatal hernia often have no symptoms. If symptoms develop, they are almost always caused by GERD. They may include:  Heartburn.  Belching.  Indigestion.  Trouble swallowing.  Coughing or wheezing.  Sore throat.  Hoarseness.  Chest pain. DIAGNOSIS  A hiatal hernia is sometimes found during an exam for another problem. Your health care provider may suspect a hiatal hernia if you have symptoms of GERD. Tests may be done to diagnose GERD. These may include:  X-rays of your stomach or chest.  An upper gastrointestinal (GI) series. This is an X-ray exam of your GI tract involving the use of a chalky liquid that you swallow. The liquid shows up clearly on the X-ray.  Endoscopy. This is a procedure to look into your stomach using a thin, flexible tube that has a tiny camera and light on the end of it. TREATMENT  If you have no symptoms, you may not  need treatment. If you have symptoms, treatment may include:  Dietary and lifestyle changes to help reduce GERD symptoms.  Medicines. These may include:  Over-the-counter antacids.  Medicines that make your stomach empty more quickly.  Medicines that block the production of stomach acid (H2 blockers).  Stronger medicines to reduce stomach acid (proton pump inhibitors).  You may need surgery to repair the hernia if other treatments are not helping. HOME CARE INSTRUCTIONS   Take all medicines as directed by your health care provider.  Quit smoking, if you smoke.  Try to achieve and maintain a healthy body weight.  Eat frequent small meals instead of three large meals a day. This keeps  your stomach from getting too full.  Eat slowly.  Do not lie down right after eating.  Do noteat 1-2 hours before bed.   Do not drink beverages with caffeine. These include cola, coffee, cocoa, and tea.  Do not drink alcohol.  Avoid foods that can make symptoms of GERD worse. These may include:  Fatty foods.  Citrus fruits.  Other foods and drinks that contain acid.  Avoid putting pressure on your belly. Anything that puts pressure on your belly increases the amount of acid that may be pushed up into your esophagus.   Avoid bending over, especially after eating.  Raise the head of your bed by putting blocks under the legs. This keeps your head and esophagus higher than your stomach.  Do not wear tight clothing around your chest or stomach.  Try not to strain when having a bowel movement, when urinating, or when lifting heavy objects. SEEK MEDICAL CARE IF:  Your symptoms are not controlled with medicines or lifestyle changes.  You are having trouble swallowing.  You have coughing or wheezing that will not go away. SEEK IMMEDIATE MEDICAL CARE IF:  Your pain is getting worse.  Your pain spreads to your arms, neck, jaw, teeth, or back.  You have shortness of breath.  You sweat for no reason.  You feel sick to your stomach (nauseous) or vomit.  You vomit blood.  You have bright red blood in your stools.  You have black, tarry stools.    This information is not intended to replace advice given to you by your health care provider. Make sure you discuss any questions you have with your health care provider.   Document Released: 09/10/2003 Document Revised: 07/11/2014 Document Reviewed: 06/07/2013 Elsevier Interactive Patient Education [employer].

## 2015-05-20 NOTE — Op Note (Signed)
EGD PROCEDURE REPORT  PATIENT:  Victoria Short  MR#:  [id number] Birthdate:  10-Nov-1924, 81 y.o., female Endoscopist:  Dr. Rogene Houston, MD Referred By:  Dr. Curlene Labrum, MD Procedure Date: 05/20/2015  Procedure:   EGD with snare polypectomy and APC ablation of duodenal polyp  Indications:  Patient is 81 year old Caucasian female who underwent EGD 1 year ago was of iron deficiency anemia and GI bleed. She was found to have large duodenal adenoma. Piecemeal polypectomy was performed. Patient could not come earlier because of other medical problems. She states heartburns well controlled with therapy. She denies epigastric pain melena or rectal bleeding. Hemoglobin in July 2016 was 15 g.            Informed Consent:  The risks, benefits, alternatives & imponderables which include, but are not limited to, bleeding, infection, perforation, drug reaction and potential missed lesion have been reviewed.  The potential for biopsy, lesion removal, esophageal dilation, etc. have also been discussed.  Questions have been answered.  All parties agreeable.  Please see history & physical in medical record for more information.  Medications:  Demerol 25 mg IV Versed 3 mg IV Glucagon 1 mg IV Cetacaine spray topically for oropharyngeal anesthesia  Description of procedure:  The endoscope was introduced through the mouth and advanced to the second portion of the duodenum without difficulty or limitations. The mucosal surfaces were surveyed very carefully during advancement of the scope and upon withdrawal.  Findings:  Esophagus: Mucosa of the esophagus was normal. GE junction was at 33 cm from the incisors. GEJ:  33 cm Hiatus:  40 cm Stomach:  Stomach was empty and distended very well with insufflation. Folds in the proximal stomach were normal. Examination mucosa gastric body was normal. Patchy erythema noted to antral mucosa but no erosions or ulcers present. Pyloric channel was patent. Angularis  fundus and cardia were examined by retroflexion of scope and were unremarkable. Large hernia was easily seen on this view. Duodenum:  Deformity to duodenal bulb with small diverticulum towards the medial wall. Larger duodenal polyp identified just past annular duodenum along the posterior wall. It was 4 cm long. Involved or than one third of the circumference.  Therapeutic/Diagnostic Maneuvers Performed:  Piecemeal polypectomy performed. Difficult to catch this polyp with snare because of its location. Residual polyp was treated with argon plasma coagulator.  Complications:  None  EBL: Minimal  Impression: Large sliding hiatal hernia. Deformity to duodenal bulb but no evidence of active peptic ulcer disease. Large duodenal polyp involving one third of the circumference just distal to angle of the duodenum. Piecemeal snare polypectomy performed and rest of the polyp treated with argon plasma coagulator as rest of the polyp could not be grabbed with a snare.  Recommendations:  Standard instructions given. No aspirin or NSAIDs for 2 weeks. Presuming histology reveals benign etiology she will return for repeat EGD in 2 months.   REHMAN,NAJEEB U  05/20/2015  1:22 PM  CC: Dr. Curlene Labrum, MD & Dr. Rayne Du ref. provider found

## 2015-05-26 ENCOUNTER — Encounter (HOSPITAL_COMMUNITY): Payer: Self-pay | Admitting: Internal Medicine

## 2015-05-28 ENCOUNTER — Encounter (HOSPITAL_COMMUNITY): Payer: Self-pay

## 2015-05-28 ENCOUNTER — Encounter (HOSPITAL_COMMUNITY)
Admission: EM | Disposition: A | Discharge status: Home / Self Care | Payer: Self-pay | Source: Home / Self Care | Attending: Internal Medicine

## 2015-05-28 ENCOUNTER — Inpatient Hospital Stay (HOSPITAL_COMMUNITY)
Admission: EM | Admit: 2015-05-28 | Discharge: 2015-05-30 | DRG: 920 | Disposition: A | Discharge status: Home / Self Care | Payer: Medicare Other | Attending: Internal Medicine | Admitting: Internal Medicine

## 2015-05-28 DIAGNOSIS — K922 Gastrointestinal hemorrhage, unspecified: Secondary | ICD-10-CM | POA: Diagnosis not present

## 2015-05-28 DIAGNOSIS — E785 Hyperlipidemia, unspecified: Secondary | ICD-10-CM | POA: Diagnosis not present

## 2015-05-28 DIAGNOSIS — I252 Old myocardial infarction: Secondary | ICD-10-CM | POA: Diagnosis not present

## 2015-05-28 DIAGNOSIS — I422 Other hypertrophic cardiomyopathy: Secondary | ICD-10-CM | POA: Diagnosis present

## 2015-05-28 DIAGNOSIS — Z955 Presence of coronary angioplasty implant and graft: Secondary | ICD-10-CM | POA: Diagnosis not present

## 2015-05-28 DIAGNOSIS — K91840 Postprocedural hemorrhage and hematoma of a digestive system organ or structure following a digestive system procedure: Secondary | ICD-10-CM | POA: Diagnosis not present

## 2015-05-28 DIAGNOSIS — Z87891 Personal history of nicotine dependence: Secondary | ICD-10-CM | POA: Diagnosis not present

## 2015-05-28 DIAGNOSIS — I959 Hypotension, unspecified: Secondary | ICD-10-CM

## 2015-05-28 DIAGNOSIS — Z9114 Patient's other noncompliance with medication regimen: Secondary | ICD-10-CM | POA: Diagnosis not present

## 2015-05-28 DIAGNOSIS — Z8249 Family history of ischemic heart disease and other diseases of the circulatory system: Secondary | ICD-10-CM | POA: Diagnosis not present

## 2015-05-28 DIAGNOSIS — K920 Hematemesis: Secondary | ICD-10-CM | POA: Diagnosis not present

## 2015-05-28 DIAGNOSIS — K219 Gastro-esophageal reflux disease without esophagitis: Secondary | ICD-10-CM | POA: Diagnosis present

## 2015-05-28 DIAGNOSIS — D500 Iron deficiency anemia secondary to blood loss (chronic): Secondary | ICD-10-CM | POA: Diagnosis present

## 2015-05-28 DIAGNOSIS — D649 Anemia, unspecified: Secondary | ICD-10-CM | POA: Diagnosis not present

## 2015-05-28 DIAGNOSIS — I2510 Atherosclerotic heart disease of native coronary artery without angina pectoris: Secondary | ICD-10-CM | POA: Diagnosis not present

## 2015-05-28 DIAGNOSIS — D6489 Other specified anemias: Secondary | ICD-10-CM

## 2015-05-28 DIAGNOSIS — Y838 Other surgical procedures as the cause of abnormal reaction of the patient, or of later complication, without mention of misadventure at the time of the procedure: Secondary | ICD-10-CM | POA: Diagnosis present

## 2015-05-28 DIAGNOSIS — I10 Essential (primary) hypertension: Secondary | ICD-10-CM | POA: Diagnosis present

## 2015-05-28 DIAGNOSIS — K449 Diaphragmatic hernia without obstruction or gangrene: Secondary | ICD-10-CM | POA: Diagnosis present

## 2015-05-28 HISTORY — PX: ESOPHAGOGASTRODUODENOSCOPY: SHX5428

## 2015-05-28 LAB — COMPREHENSIVE METABOLIC PANEL
ALBUMIN: 3.2 g/dL — AB (ref 3.5–5.0)
ALK PHOS: 57 U/L (ref 38–126)
ALT: 12 U/L — ABNORMAL LOW (ref 14–54)
ANION GAP: 5 (ref 5–15)
AST: 16 U/L (ref 15–41)
BUN: 30 mg/dL — ABNORMAL HIGH (ref 6–20)
CHLORIDE: 107 mmol/L (ref 101–111)
CO2: 30 mmol/L (ref 22–32)
Calcium: 9 mg/dL (ref 8.9–10.3)
Creatinine, Ser: 0.67 mg/dL (ref 0.44–1.00)
GFR calc Af Amer: >60 mL/min (ref >60)
GFR calc non Af Amer: >60 mL/min (ref >60)
GLUCOSE: 144 mg/dL — AB (ref 65–99)
POTASSIUM: 4 mmol/L (ref 3.5–5.1)
SODIUM: 142 mmol/L (ref 135–145)
Total Bilirubin: 1 mg/dL (ref 0.3–1.2)
Total Protein: 5.8 g/dL — ABNORMAL LOW (ref 6.5–8.1)

## 2015-05-28 LAB — CBC
HCT: 27.5 % — ABNORMAL LOW (ref 36.0–46.0)
HEMATOCRIT: 31.2 % — AB (ref 36.0–46.0)
Hemoglobin: 10.3 g/dL — ABNORMAL LOW (ref 12.0–15.0)
Hemoglobin: 9.3 g/dL — ABNORMAL LOW (ref 12.0–15.0)
MCH: 31 pg (ref 26.0–34.0)
MCH: 31.6 pg (ref 26.0–34.0)
MCHC: 33 g/dL (ref 30.0–36.0)
MCHC: 33.8 g/dL (ref 30.0–36.0)
MCV: 94 fL (ref 78.0–100.0)
MCV: 93.5 fL (ref 78.0–100.0)
PLATELETS: 112 10*3/uL — AB (ref 150–400)
Platelets: 116 10*3/uL — ABNORMAL LOW (ref 150–400)
RBC: 3.32 MIL/uL — ABNORMAL LOW (ref 3.87–5.11)
RBC: 2.94 MIL/uL — ABNORMAL LOW (ref 3.87–5.11)
RDW: 16.3 % — AB (ref 11.5–15.5)
RDW: 16.7 % — AB (ref 11.5–15.5)
WBC: 6.6 10*3/uL (ref 4.0–10.5)
WBC: 6.1 10*3/uL (ref 4.0–10.5)

## 2015-05-28 LAB — CBC WITH DIFFERENTIAL/PLATELET
BASOS ABS: 0 10*3/uL (ref 0.0–0.1)
BASOS PCT: 0 %
EOS ABS: 0.1 10*3/uL (ref 0.0–0.7)
Eosinophils Relative: 2 %
HEMATOCRIT: 36.2 % (ref 36.0–46.0)
HEMOGLOBIN: 11.8 g/dL — AB (ref 12.0–15.0)
LYMPHS ABS: 1.8 10*3/uL (ref 0.7–4.0)
LYMPHS PCT: 31 %
MCH: 31.9 pg (ref 26.0–34.0)
MCHC: 32.6 g/dL (ref 30.0–36.0)
MCV: 97.8 fL (ref 78.0–100.0)
MONO ABS: 0.3 10*3/uL (ref 0.1–1.0)
MONOS PCT: 5 %
NEUTROS ABS: 3.7 10*3/uL (ref 1.7–7.7)
NEUTROS PCT: 62 %
Platelets: 179 10*3/uL (ref 150–400)
RBC: 3.7 MIL/uL — ABNORMAL LOW (ref 3.87–5.11)
RDW: 14.2 % (ref 11.5–15.5)
WBC: 6 10*3/uL (ref 4.0–10.5)

## 2015-05-28 LAB — SAMPLE TO BLOOD BANK

## 2015-05-28 LAB — SURGICAL PCR SCREEN
MRSA, PCR: POSITIVE — AB
STAPHYLOCOCCUS AUREUS: POSITIVE — AB

## 2015-05-28 LAB — PROTIME-INR
INR: 1.11 (ref 0.00–1.49)
INR: 1.22 (ref 0.00–1.49)
PROTHROMBIN TIME: 14.5 s (ref 11.6–15.2)
PROTHROMBIN TIME: 15.5 s — AB (ref 11.6–15.2)

## 2015-05-28 LAB — PREPARE RBC (CROSSMATCH)

## 2015-05-28 LAB — HEMOGLOBIN AND HEMATOCRIT, BLOOD
HEMATOCRIT: 26.3 % — AB (ref 36.0–46.0)
HEMOGLOBIN: 8.7 g/dL — AB (ref 12.0–15.0)

## 2015-05-28 LAB — ABO/RH: ABO/RH(D): A POS

## 2015-05-28 LAB — POC OCCULT BLOOD, ED: Fecal Occult Bld: POSITIVE — AB

## 2015-05-28 LAB — APTT: APTT: 31 s (ref 24–37)

## 2015-05-28 SURGERY — EGD (ESOPHAGOGASTRODUODENOSCOPY)
Anesthesia: Moderate Sedation

## 2015-05-28 MED ORDER — ACETAMINOPHEN 325 MG PO TABS: 650.0000 mg | ORAL_TABLET | Freq: Four times a day (QID) | ORAL | Status: DC | PRN

## 2015-05-28 MED ORDER — CETYLPYRIDINIUM CHLORIDE 0.05 % MT LIQD
7.0000 mL | Freq: Two times a day (BID) | OROMUCOSAL | Status: DC
  Administered 2015-05-29 (×2): 7 mL via OROMUCOSAL

## 2015-05-28 MED ORDER — MIDAZOLAM HCL 5 MG/5ML IJ SOLN
INTRAMUSCULAR | Status: AC
  Filled 2015-05-28: qty 10

## 2015-05-28 MED ORDER — ATROPINE SULFATE 1 MG/ML IJ SOLN
INTRAMUSCULAR | Status: AC
  Filled 2015-05-28: qty 1

## 2015-05-28 MED ORDER — SODIUM CHLORIDE 0.9 % IV SOLN: Freq: Once | INTRAVENOUS | Status: DC

## 2015-05-28 MED ORDER — SODIUM CHLORIDE 0.9 % IV SOLN: INTRAVENOUS | Status: DC

## 2015-05-28 MED ORDER — SODIUM CHLORIDE 0.9 % IJ SOLN
3.0000 mL | Freq: Two times a day (BID) | INTRAMUSCULAR | Status: DC
  Administered 2015-05-28 – 2015-05-29 (×2): 3 mL via INTRAVENOUS

## 2015-05-28 MED ORDER — SODIUM CHLORIDE 0.9 % IV BOLUS (SEPSIS)
1000.0000 mL | Freq: Once | INTRAVENOUS | Status: AC
  Administered 2015-05-28: 1000 mL via INTRAVENOUS

## 2015-05-28 MED ORDER — ONDANSETRON HCL 4 MG/2ML IJ SOLN: 4.0000 mg | Freq: Four times a day (QID) | INTRAMUSCULAR | Status: DC | PRN

## 2015-05-28 MED ORDER — METOCLOPRAMIDE HCL 5 MG/ML IJ SOLN
5.0000 mg | Freq: Once | INTRAMUSCULAR | Status: AC
  Administered 2015-05-28: 5 mg via INTRAVENOUS
  Filled 2015-05-28: qty 2

## 2015-05-28 MED ORDER — SODIUM CHLORIDE 0.9 % IV SOLN
8.0000 mg/h | INTRAVENOUS | Status: DC
  Administered 2015-05-28 – 2015-05-29 (×2): 8 mg/h via INTRAVENOUS
  Filled 2015-05-28 (×10): qty 80

## 2015-05-28 MED ORDER — SODIUM CHLORIDE 0.9 % IV SOLN
INTRAVENOUS | Status: DC
  Administered 2015-05-28 – 2015-05-29 (×2): via INTRAVENOUS

## 2015-05-28 MED ORDER — SODIUM CHLORIDE 0.9 % IJ SOLN
PREFILLED_SYRINGE | INTRAMUSCULAR | Status: DC | PRN
  Administered 2015-05-28: 10 mL

## 2015-05-28 MED ORDER — MIDAZOLAM HCL 5 MG/5ML IJ SOLN
INTRAMUSCULAR | Status: DC | PRN
  Administered 2015-05-28: 1 mg via INTRAVENOUS
  Administered 2015-05-28: 0.5 mg via INTRAVENOUS

## 2015-05-28 MED ORDER — ONDANSETRON HCL 4 MG/2ML IJ SOLN
4.0000 mg | Freq: Once | INTRAMUSCULAR | Status: AC
  Administered 2015-05-28: 4 mg via INTRAVENOUS
  Filled 2015-05-28: qty 2

## 2015-05-28 MED ORDER — ACETAMINOPHEN 650 MG RE SUPP: 650.0000 mg | Freq: Four times a day (QID) | RECTAL | Status: DC | PRN

## 2015-05-28 MED ORDER — SUCRALFATE 1 GM/10ML PO SUSP
1.0000 g | Freq: Three times a day (TID) | ORAL | Status: DC
  Administered 2015-05-28 – 2015-05-30 (×6): 1 g via ORAL
  Filled 2015-05-28 (×6): qty 10

## 2015-05-28 MED ORDER — OXYCODONE HCL 5 MG PO TABS: 5.0000 mg | ORAL_TABLET | ORAL | Status: DC | PRN

## 2015-05-28 MED ORDER — HYDROMORPHONE HCL 1 MG/ML IJ SOLN: 0.5000 mg | INTRAMUSCULAR | Status: DC | PRN

## 2015-05-28 MED ORDER — PANTOPRAZOLE SODIUM 40 MG IV SOLR: 40.0000 mg | Freq: Two times a day (BID) | INTRAVENOUS | Status: DC

## 2015-05-28 MED ORDER — MEPERIDINE HCL 50 MG/ML IJ SOLN
INTRAMUSCULAR | Status: AC
  Filled 2015-05-28: qty 1

## 2015-05-28 MED ORDER — BUTAMBEN-TETRACAINE-BENZOCAINE 2-2-14 % EX AERO
INHALATION_SPRAY | CUTANEOUS | Status: DC | PRN
  Administered 2015-05-28 (×2): 1 via TOPICAL

## 2015-05-28 MED ORDER — SODIUM CHLORIDE 0.9 % IV SOLN: 10.0000 mL/h | Freq: Once | INTRAVENOUS | Status: DC

## 2015-05-28 MED ORDER — ONDANSETRON HCL 4 MG PO TABS: 4.0000 mg | ORAL_TABLET | Freq: Four times a day (QID) | ORAL | Status: DC | PRN

## 2015-05-28 MED ORDER — CHLORHEXIDINE GLUCONATE 0.12 % MT SOLN
15.0000 mL | Freq: Two times a day (BID) | OROMUCOSAL | Status: DC
  Administered 2015-05-28 – 2015-05-30 (×4): 15 mL via OROMUCOSAL
  Filled 2015-05-28 (×4): qty 15

## 2015-05-28 MED ORDER — SODIUM CHLORIDE 0.9 % IV BOLUS (SEPSIS)
500.0000 mL | Freq: Once | INTRAVENOUS | Status: AC
  Administered 2015-05-28: 05:00:00 via INTRAVENOUS

## 2015-05-28 MED ORDER — ONDANSETRON HCL 4 MG/2ML IJ SOLN: 4.0000 mg | Freq: Once | INTRAMUSCULAR | Status: DC

## 2015-05-28 MED ORDER — SODIUM CHLORIDE 0.9 % IV SOLN
80.0000 mg | Freq: Once | INTRAVENOUS | Status: DC
  Filled 2015-05-28: qty 80

## 2015-05-28 NOTE — Consult Note (Signed)
Referring Provider: Orvan Falconer, MD Primary Care Physician:  Curlene Labrum, MD Primary Gastroenterologist:  Dr. Laural Golden  Reason for Consultation:    Upper GI bleed.  HPI:   Patient is 81 year old Caucasian Short with multiple medical problems who lives at home with her husband in [place] was evaluated last year for history of iron deficiency anemia and need for frequent iron infusions. She had undergone EGD and colonoscopy by Dr. Britta Mccreedy of [facility] is unremarkable. She underwent given capsule study 1 year ago revealing erosions in stomach covered with blood and AVM. She was felt pleating from upper GI tract. She underwent esophagogastroduodenoscopy and found to have large broad-based duodenal polyp in the second part of the duodenum. Piecemeal polypectomy was performed. This polyp was a tubular adenoma. Patient has not able to return because of cardiac issues. She finally returned for EGD days ago. Polyp coronary large. Piecemeal polypectomy was performed at rest of the polyp was treated with APC. Once again polyp was was tubular adenoma. Patient did fine until yesterday morning when she noted her stool was black. She called our office and was advised to come to emergency room that she did not. She did take a single dose of aspirin yesterday morning before breakfast. Patient had been advised not to take aspirin for 2 weeks. She just forgot. She did find rest of the day. She woke up at 2 AM and vomited bright red lot. She's had few more bowel movements last ones revealed fresh blood. She denies shortness of breath chest pain or abdominal pain. She has noted pain in her occipital region. Hemoglobin on admission was 11.8 g and now it's 8.7.        Past Medical History  Diagnosis Date  . Hypertension   . Sleep apnea   . Blood transfusion   . GERD (gastroesophageal reflux disease)   . Pneumonia   . Anemia   . Stress-induced cardiomyopathy 05/2011    a. ?Takotubso  cardiomyopathy - distribution was different than that of LAD stenosis.  . Hyperlipidemia   . Coronary artery disease     a. NSTEMI 05/2011 s/p DES to LAD, with ?Takotsubo distribution of cardiomyopathy at that time with cardiogenic shock. b. NSTEMI 06/2011 ?spasm - stable CAD, normal EF. c. Patent LAD stent, nonobst CAD by cath 08/20/12 with normal EF. d. Hx dyspnea with Brilinta.  . Hypertrophic cardiomyopathy ([place])     a. echo 08/2012: focal basal septal hypertrophy, mildly elevated gradient 68mmHg across LVOT.  . Iron deficiency     a. Hx iron infusion.  . Nasal polyp   . Carotid artery disease (HCC)     a. [id number] RICA, 123456 LICA 0000000.  . Cardiogenic shock ([place])     a. 05/2011 requiring pressor therapy.  . Mitral regurgitation   . Cancer [facility])     Skin    Past Surgical History  Procedure Laterality Date  . Appendectomy    . Partial hysterectomy    . Abdominal hysterectomy    . Wisdom tooth extraction    . Cardiac catheterization  05/2011    80% mid LAD, 40% LCX. EF: 35-40%.   . Coronary angioplasty with stent placement  05/2011    LAD: 2.5 X 16 mm Promus DES  . Cardiac catheterization  06/22/2011    patent LAD stent. Normal EF  . Cardiac catheterization  08/2012    Patent LAD stent with normal ejection fraction.  . Givens capsule study N/A 05/12/2014    Procedure:  GIVENS CAPSULE STUDY;  Surgeon: Rogene Houston, MD;  [facility];  Service: Endoscopy;  Laterality: N/A;  730  . Esophagogastroduodenoscopy N/A 05/23/2014    Procedure: ESOPHAGOGASTRODUODENOSCOPY (EGD);  Surgeon: Rogene Houston, MD;  [facility];  Service: Endoscopy;  Laterality: N/A;  230 - moved to 1:05 - Ann notified pt  . Left heart catheterization with coronary angiogram N/A 05/12/2011    Procedure: LEFT HEART CATHETERIZATION WITH CORONARY ANGIOGRAM;  Surgeon: Troy Sine, MD;  Location: [facility] CATH LAB;  Service: Cardiovascular;  Laterality: N/A;  . Percutaneous coronary stent  intervention (pci-s)  05/12/2011    Procedure: PERCUTANEOUS CORONARY STENT INTERVENTION (PCI-S);  Surgeon: Troy Sine, MD;  Location: [facility] CATH LAB;  Service: Cardiovascular;;  . Left heart catheterization with coronary angiogram N/A 06/22/2011    Procedure: LEFT HEART CATHETERIZATION WITH CORONARY ANGIOGRAM;  Surgeon: Wellington Hampshire, MD;  Location: [facility] CATH LAB;  Service: Cardiovascular;  Laterality: N/A;  . Left heart catheterization with coronary angiogram N/A 08/20/2012    Procedure: LEFT HEART CATHETERIZATION WITH CORONARY ANGIOGRAM;  Surgeon: Burnell Blanks, MD;  Location: [facility] CATH LAB;  Service: Cardiovascular;  Laterality: N/A;  . Cardiac catheterization N/A 01/19/2015    Procedure: Right/Left Heart Cath and Coronary Angiography;  Surgeon: Jettie Booze, MD;  Location: [facility] CV LAB;  Service: Cardiovascular;  Laterality: N/A;  . Esophagogastroduodenoscopy N/A 05/20/2015    Procedure: ESOPHAGOGASTRODUODENOSCOPY (EGD);  Surgeon: Rogene Houston, MD;  [facility];  Service: Endoscopy;  Laterality: N/A;  1:00  . Polypectomy N/A 05/20/2015    Procedure: EGD with polypectomy;  Surgeon: Rogene Houston, MD;  [facility];  Service: Endoscopy;  Laterality: N/A;    Prior to Admission medications   Medication Sig Start Date End Date Taking? Authorizing Provider  amLODipine (NORVASC) 5 MG tablet Take 1 tablet (5 mg total) by mouth daily. Patient taking differently: Take 5 mg by mouth at bedtime.  01/12/15  Yes Herminio Commons, MD  Ascorbic Acid (VITAMIN C) 500 MG CAPS Take 500 mg by mouth daily.    Yes Historical Provider, MD  diazepam (VALIUM) 5 MG tablet Take 2.5-5 mg by mouth every 6 (six) hours as needed for anxiety.    Yes Historical Provider, MD  docusate sodium (COLACE) 100 MG capsule Take 1 capsule (100 mg total) by mouth 2 (two) times daily. 12/18/14  Yes Rogene Houston, MD  fluticasone (FLONASE) 50 MCG/ACT nasal spray Place 1 spray into both  nostrils daily as needed for allergies or rhinitis (congestion).   Yes Historical Provider, MD  isosorbide dinitrate (ISORDIL) 30 MG tablet Take 30 mg by mouth 4 (four) times daily.   Yes Historical Provider, MD  metoprolol succinate (TOPROL-XL) 25 MG 24 hr tablet TAKE ONE TABLET BY MOUTH ONCE EVERY DAY 06/08/12  Yes Josue Hector, MD  nitroGLYCERIN (NITROSTAT) 0.4 MG SL tablet Place 1 tablet (0.4 mg total) under the tongue every 5 (five) minutes as needed for chest pain. 11/25/14  Yes Herminio Commons, MD  pantoprazole (PROTONIX) 40 MG tablet TAKE 1 TABLET (40 MG TOTAL) BY MOUTH DAILY BEFORE BREAKFAST. 01/07/15  Yes Butch Penny, NP  simvastatin (ZOCOR) 40 MG tablet TAKE 1 TABLET (40 MG TOTAL) BY MOUTH AT BEDTIME. 06/05/12  Yes Burna Forts Serpe, PA-C  terbinafine (LAMISIL) 1 % cream Apply 1 application topically daily as needed (rash).    Yes Historical Provider, MD  triamcinolone lotion (KENALOG) 0.1 %  Apply 1 application topically 3 (three) times daily as needed (rash).    Yes Historical Provider, MD  metroNIDAZOLE (FLAGYL) 250 MG tablet Take 250 mg by mouth 3 (three) times daily.    Historical Provider, MD    Current Facility-Administered Medications  Medication Dose Route Frequency Provider Last Rate Last Dose  . 0.9 %  sodium chloride infusion   Intravenous Once Rolland Porter, MD      . 0.9 %  sodium chloride infusion   Intravenous Continuous Rolland Porter, MD 75 mL/hr at 05/28/15 0545    . 0.9 %  sodium chloride infusion  10 mL/hr Intravenous Once Rolland Porter, MD      . 0.9 %  sodium chloride infusion   Intravenous Continuous Harvette Evonnie Dawes, MD      . 0.9 %  sodium chloride infusion   Intravenous Once Rogene Houston, MD      . acetaminophen (TYLENOL) tablet 650 mg  650 mg Oral Q6H PRN Theressa Millard, MD       Or  . acetaminophen (TYLENOL) suppository 650 mg  650 mg Rectal Q6H PRN Theressa Millard, MD      . HYDROmorphone (DILAUDID) injection 0.5-1 mg  0.5-1 mg Intravenous Q3H PRN Theressa Millard, MD      . ondansetron (ZOFRAN) tablet 4 mg  4 mg Oral Q6H PRN Theressa Millard, MD       Or  . ondansetron (ZOFRAN) injection 4 mg  4 mg Intravenous Q6H PRN Harvette Evonnie Dawes, MD      . oxyCODONE (Oxy IR/ROXICODONE) immediate release tablet 5 mg  5 mg Oral Q4H PRN Theressa Millard, MD      . pantoprazole (PROTONIX) 80 mg in sodium chloride 0.9 % 100 mL IVPB  80 mg Intravenous Once Theressa Millard, MD      . pantoprazole (PROTONIX) 80 mg in sodium chloride 0.9 % 250 mL (0.32 mg/mL) infusion  8 mg/hr Intravenous Continuous Theressa Millard, MD      . Derrill Memo ON 05/31/2015] pantoprazole (PROTONIX) injection 40 mg  40 mg Intravenous Q12H Harvette C Jenkins, MD      . sodium chloride 0.9 % injection 3 mL  3 mL Intravenous Q12H Theressa Millard, MD        Allergies as of 05/28/2015 - Review Complete 05/28/2015  Allergen Reaction Noted  . Brilinta [ticagrelor] Nausea And Vomiting and Other (See Comments) 08/21/2012  . Iodine Nausea And Vomiting 05/11/2011  . Shellfish allergy Nausea And Vomiting and Other (See Comments) 05/11/2011  . Streptomycin Nausea And Vomiting 01/16/2015  . Iodinated diagnostic agents Nausea Only 01/16/2015    Family History  Problem Relation Age of Onset  . Heart attack Mother 56  . Diabetic kidney disease Father 52  . Prostate cancer Father     Social History   Social History  . Marital Status: Married    Spouse Name: N/A  . Number of Children: N/A  . Years of Education: N/A   Occupational History  . Not on file.   Social History Main Topics  . Smoking status: Former Smoker -- 1.00 packs/day for 18 years    Types: Cigarettes    Quit date: 05/11/1979  . Smokeless tobacco: Never Used  . Alcohol Use: No  . Drug Use: No  . Sexual Activity: Yes    Birth Control/ Protection: Post-menopausal   Other Topics Concern  . Not on file   Social History Narrative  Review of Systems: See HPI, otherwise normal ROS  Physical  Exam: Temp:  [97.6 F (36.4 C)-98 F (36.7 C)] 98 F (36.7 C) (11/24 0538) Pulse Rate:  [70-83] 78 (11/24 0538) Resp:  [16-24] 24 (11/24 0538) BP: (86-120)/(55-70) 94/55 mmHg (11/24 0538) SpO2:  [92 %-100 %] 100 % (11/24 0538) Weight:  [131 lb 11.2 oz (59.739 kg)-133 lb (60.328 kg)] 131 lb 11.2 oz (59.739 kg) (11/24 0538) Last BM Date: 05/28/15 Patient is alert and in no acute distress. She appears pale. Conjunctiva is pale sclerae nonicteric. Oropharyngeal mucosa is normal. No neck masses or thyromegaly noted. Cardiac exam with regular rhythm normal S1 and S2. Grade 2/6 systolic ejection murmur best heard at apex. Lungs are clear to auscultation. Abdomen is symmetrical. Bowel sounds are normal. On palpation abdomen is soft and nontender without organomegaly or masses. No peripheral edema or clubbing noted.   Lab Results:  Recent Labs  05/28/15 0320 05/28/15 0621  WBC 6.0  --   HGB 11.8* 8.7*  HCT 36.2 26.3*  PLT 179  --    BMET  Recent Labs  05/28/15 0320  NA 142  K 4.0  CL 107  CO2 30  GLUCOSE 144*  BUN 30*  CREATININE 0.67  CALCIUM 9.0   LFT  Recent Labs  05/28/15 0320  PROT 5.8*  ALBUMIN 3.2*  AST 16  ALT 12*  ALKPHOS 57  BILITOT 1.0   PT/INR  Recent Labs  05/28/15 0320  LABPROT 14.5  INR 1.11    Assessment; Patient is 81 year old Caucasian Short who underwent piecemeal polypectomy and APC therapy of large duodenal adenoma on 05/20/2015 who now presents with upper GI bleed. She is suspected to have delayed post polypectomy bleed. Hemoglobin has dropped significantly. She appears to be hemodynamically stable. Will proceed with therapeutic EGD this morning.  Anemia secondary to upper GI bleed. Hemoglobin was 15 g on 01/19/2015, 7.8 g on admission and 8.7 this morning.  Recommendations; Metoclopramide 5 mg IV 1. Will transfuse 1 unit of PRBCs as her hemoglobin is expected to drop further. Esophagogastroduodenoscopy with therapeutic  intention to be performed this morning. Patient is agreeable to proceed with EGD.   LOS: 0 days   Kentrell Guettler U  05/28/2015, 8:41 AM

## 2015-05-28 NOTE — Progress Notes (Signed)
Late entry:  0830 pt taken to endo prior to blood transfusion due to blood not ready.  Post procedure pt transferred to ICU from endo.

## 2015-05-28 NOTE — H&P (Signed)
[facility] Admission History and Physical       Victoria Short [id number] DOB: 05-12-1925 DOA: 05/28/2015   Referring physician: EDP PCP: Curlene Labrum, MD  Specialists: GI: Dr Felipe Drone  Chief Complaint: Vomiting Bright Red Blood  HPI: Victoria Short is a 81 y.o. female with a history of CAD with Stent x 1, HTN, GERD, OSA , and Anemia who presented to the ED with complaints of hematemesis yesterday afternoon, with associated symptoms of weakness and light headedness, and later she passed black stool.  She denies any ABD pain.  She was seen in the ED, and her admission Hb was found at 11, and in July 2016 her last hemoglobin was 15.    The EDp contacted GI on call Dr Felipe Drone for consultation, and he advised that patient be admitted by the Hospitalists service and he would see her this am.   OF Note:   She had an endoscopy performed by dr Laural Golden which had revealed a large duodenal polyp that was excised on November 16.       Review of Systems:  Constitutional: No Weight Loss, No Weight Gain, Night Sweats, Fevers, Chills, Dizziness, +Light Headedness, Fatigue, or Generalized Weakness HEENT: No Headaches, Difficulty Swallowing,Tooth/Dental Problems,Sore Throat,  No Sneezing, Rhinitis, Ear Ache, Nasal Congestion, or Post Nasal Drip,  Cardio-vascular:  No Chest pain, Orthopnea, PND, Edema in Lower Extremities, Anasarca, Dizziness, Palpitations  Resp: No Dyspnea, No DOE, No Productive Cough, No Non-Productive Cough, No Hemoptysis, No Wheezing.    GI: No Heartburn, Indigestion, Abdominal Pain, Nausea, Vomiting, Diarrhea, Constipation, +Hematemesis, Hematochezia, +Melena, Change in Bowel Habits,  Loss of Appetite  GU: No Dysuria, No Change in Color of Urine, No Urgency or Urinary Frequency, No Flank pain.  Musculoskeletal: No Joint Pain or Swelling, No Decreased Range of Motion, No Back Pain.  Neurologic: No Syncope, No Seizures, Muscle Weakness, Paresthesia, Vision Disturbance or Loss,  No Diplopia, No Vertigo, No Difficulty Walking,  Skin: No Rash or Lesions. Psych: No Change in Mood or Affect, No Depression or Anxiety, No Memory loss, No Confusion, or Hallucinations   Past Medical History  Diagnosis Date  . Hypertension   . Sleep apnea   . Blood transfusion   . GERD (gastroesophageal reflux disease)   . Pneumonia   . Anemia   . Stress-induced cardiomyopathy 05/2011    a. ?Takotubso cardiomyopathy - distribution was different than that of LAD stenosis.  . Hyperlipidemia   . Coronary artery disease     a. NSTEMI 05/2011 s/p DES to LAD, with ?Takotsubo distribution of cardiomyopathy at that time with cardiogenic shock. b. NSTEMI 06/2011 ?spasm - stable CAD, normal EF. c. Patent LAD stent, nonobst CAD by cath 08/20/12 with normal EF. d. Hx dyspnea with Brilinta.  . Hypertrophic cardiomyopathy ([place])     a. echo 08/2012: focal basal septal hypertrophy, mildly elevated gradient 32mmHg across LVOT.  . Iron deficiency     a. Hx iron infusion.  . Nasal polyp   . Carotid artery disease (HCC)     a. [id number] RICA, 123456 LICA 0000000.  . Cardiogenic shock ([place])     a. 05/2011 requiring pressor therapy.  . Mitral regurgitation   . Cancer [facility])     Skin     Past Surgical History  Procedure Laterality Date  . Appendectomy    . Partial hysterectomy    . Abdominal hysterectomy    . Wisdom tooth extraction    . Cardiac catheterization  05/2011    80% mid  LAD, 40% LCX. EF: 35-40%.   . Coronary angioplasty with stent placement  05/2011    LAD: 2.5 X 16 mm Promus DES  . Cardiac catheterization  06/22/2011    patent LAD stent. Normal EF  . Cardiac catheterization  08/2012    Patent LAD stent with normal ejection fraction.  [name] capsule study N/A 05/12/2014    Procedure: GIVENS CAPSULE STUDY;  Surgeon: Rogene Houston, MD;  [facility];  Service: Endoscopy;  Laterality: N/A;  730  . Esophagogastroduodenoscopy N/A 05/23/2014    Procedure:  ESOPHAGOGASTRODUODENOSCOPY (EGD);  Surgeon: Rogene Houston, MD;  [facility];  Service: Endoscopy;  Laterality: N/A;  230 - moved to 1:05 - Ann notified pt  . Left heart catheterization with coronary angiogram N/A 05/12/2011    Procedure: LEFT HEART CATHETERIZATION WITH CORONARY ANGIOGRAM;  Surgeon: Troy Sine, MD;  Location[facility] CATH LAB;  Service: Cardiovascular;  Laterality: N/A;  . Percutaneous coronary stent intervention (pci-s)  05/12/2011    Procedure: PERCUTANEOUS CORONARY STENT INTERVENTION (PCI-S);  Surgeon: Troy Sine, MD;  Location: [facility] CATH LAB;  Service: Cardiovascular;;  . Left heart catheterization with coronary angiogram N/A 06/22/2011    Procedure: LEFT HEART CATHETERIZATION WITH CORONARY ANGIOGRAM;  Surgeon: Wellington Hampshire, MD;  Location: [place] CATH LAB;  Service: Cardiovascular;  Laterality: N/A;  . Left heart catheterization with coronary angiogram N/A 08/20/2012    Procedure: LEFT HEART CATHETERIZATION WITH CORONARY ANGIOGRAM;  Surgeon: Burnell Blanks, MD;  Location: [facility] CATH LAB;  Service: Cardiovascular;  Laterality: N/A;  . Cardiac catheterization N/A 01/19/2015    Procedure: Right/Left Heart Cath and Coronary Angiography;  Surgeon: Jettie Booze, MD;  Location: [place] CV LAB;  Service: Cardiovascular;  Laterality: N/A;  . Esophagogastroduodenoscopy N/A 05/20/2015    Procedure: ESOPHAGOGASTRODUODENOSCOPY (EGD);  Surgeon: Rogene Houston, MD;  [facility];  Service: Endoscopy;  Laterality: N/A;  1:00  . Polypectomy N/A 05/20/2015    Procedure: EGD with polypectomy;  Surgeon: Rogene Houston, MD;  [facility];  Service: Endoscopy;  Laterality: N/A;      Prior to Admission medications   Medication Sig Start Date End Date Taking? Authorizing Provider  amLODipine (NORVASC) 5 MG tablet Take 1 tablet (5 mg total) by mouth daily. Patient taking differently: Take 5 mg by mouth at bedtime.  01/12/15  Yes Herminio Commons,  MD  Ascorbic Acid (VITAMIN C) 500 MG CAPS Take 500 mg by mouth daily.    Yes Historical Provider, MD  diazepam (VALIUM) 5 MG tablet Take 2.5-5 mg by mouth every 6 (six) hours as needed for anxiety.    Yes Historical Provider, MD  docusate sodium (COLACE) 100 MG capsule Take 1 capsule (100 mg total) by mouth 2 (two) times daily. 12/18/14  Yes Rogene Houston, MD  fluticasone (FLONASE) 50 MCG/ACT nasal spray Place 1 spray into both nostrils daily as needed for allergies or rhinitis (congestion).   Yes Historical Provider, MD  isosorbide dinitrate (ISORDIL) 30 MG tablet Take 30 mg by mouth 4 (four) times daily.   Yes Historical Provider, MD  metoprolol succinate (TOPROL-XL) 25 MG 24 hr tablet TAKE ONE TABLET BY MOUTH ONCE EVERY DAY 06/08/12  Yes Josue Hector, MD  nitroGLYCERIN (NITROSTAT) 0.4 MG SL tablet Place 1 tablet (0.4 mg total) under the tongue every 5 (five) minutes as needed for chest pain. 11/25/14  Yes Herminio Commons, MD  pantoprazole (PROTONIX) 40 MG  tablet TAKE 1 TABLET (40 MG TOTAL) BY MOUTH DAILY BEFORE BREAKFAST. 01/07/15  Yes Butch Penny, NP  simvastatin (ZOCOR) 40 MG tablet TAKE 1 TABLET (40 MG TOTAL) BY MOUTH AT BEDTIME. 06/05/12  Yes Burna Forts Serpe, PA-C  terbinafine (LAMISIL) 1 % cream Apply 1 application topically daily as needed (rash).    Yes Historical Provider, MD  triamcinolone lotion (KENALOG) 0.1 % Apply 1 application topically 3 (three) times daily as needed (rash).    Yes Historical Provider, MD  metroNIDAZOLE (FLAGYL) 250 MG tablet Take 250 mg by mouth 3 (three) times daily.    Historical Provider, MD     Allergies  Allergen Reactions  . Brilinta [Ticagrelor] Nausea And Vomiting and Other (See Comments)    dyspnea  . Iodine Nausea And Vomiting  . Shellfish Allergy Nausea And Vomiting and Other (See Comments)    Unsteady gait   . Streptomycin Nausea And Vomiting  . Iodinated Diagnostic Agents Nausea Only    Social History:  reports that she quit smoking  about 36 years ago. Her smoking use included Cigarettes. She has a 18 pack-year smoking history. She has never used smokeless tobacco. She reports that she does not drink alcohol or use illicit drugs.    Family History  Problem Relation Age of Onset  . Heart attack Mother 58  . Diabetic kidney disease Father 80  . Prostate cancer Father        Physical Exam:  GEN:  Pleasant Elderly Obese 81 y.o. Caucasian female examined and in no acute distress; cooperative with exam Filed Vitals:   05/28/15 0430 05/28/15 0439 05/28/15 0500 05/28/15 0538  BP: 95/56 102/70 96/59 94/55   Pulse: 70 73 73 78  Temp:    98 F (36.7 C)  TempSrc:    Oral  Resp: 17 16 23 24   Height:    5\' 6"  (1.676 m)  Weight:    59.739 kg (131 lb 11.2 oz)  SpO2: 98% 100% 99% 100%   Blood pressure 94/55, pulse 78, temperature 98 F (36.7 C), temperature source Oral, resp. rate 24, height 5\' 6"  (1.676 m), weight 59.739 kg (131 lb 11.2 oz), SpO2 100 %. PSYCH: SHe is alert and oriented x4; does not appear anxious does not appear depressed; affect is normal HEENT: Normocephalic and Atraumatic, Mucous membranes pink; PERRLA; EOM intact; Fundi:  Benign;  No scleral icterus, Nares: Patent, Oropharynx: Clear, Fair Dentition,    Neck:  FROM, No Cervical Lymphadenopathy nor Thyromegaly or Carotid Bruit; No JVD; Breasts:: Not examined CHEST WALL: No tenderness CHEST: Normal respiration, clear to auscultation bilaterally HEART: Regular rate and rhythm; no murmurs rubs or gallops BACK: No kyphosis or scoliosis; No CVA tenderness ABDOMEN: Positive Bowel Sounds,  Obese, Soft Non-Tender, No Rebound or Guarding; No Masses, No Organomegaly, No Pannus; No Intertriginous candida. Rectal Exam: Not done EXTREMITIES: No Cyanosis, Clubbing, or Edema; No Ulcerations. Genitalia: not examined PULSES: 2+ and symmetric SKIN: Normal hydration no rash or ulceration CNS:  Alert and Oriented x 4, No Focal Deficits Vascular: pulses palpable  throughout    Labs on Admission:  Basic Metabolic Panel:  Recent Labs Lab 05/28/15 0320  NA 142  K 4.0  CL 107  CO2 30  GLUCOSE 144*  BUN 30*  CREATININE 0.67  CALCIUM 9.0   Liver Function Tests:  Recent Labs Lab 05/28/15 0320  AST 16  ALT 12*  ALKPHOS 57  BILITOT 1.0  PROT 5.8*  ALBUMIN 3.2*   No results for input(s):  LIPASE, AMYLASE in the last 168 hours. No results for input(s): AMMONIA in the last 168 hours. CBC:  Recent Labs Lab 05/28/15 0320  WBC 6.0  NEUTROABS 3.7  HGB 11.8*  HCT 36.2  MCV 97.8  PLT 179   Cardiac Enzymes: No results for input(s): CKTOTAL, CKMB, CKMBINDEX, TROPONINI in the last 168 hours.  BNP (last 3 results) No results for input(s): BNP in the last 8760 hours.  ProBNP (last 3 results) No results for input(s): PROBNP in the last 8760 hours.  CBG: No results for input(s): GLUCAP in the last 168 hours.  Radiological Exams on Admission: No results found.    Assessment/Plan:      81 y.o. female with  Principal Problem:   1.       Upper GI bleed   IV Protonix Drip   NPO   GI Dr Felipe Drone to see  This AM     Active Problems:   2.      Anemia   Monitor H/Hs   Transfuse PRN      3.     Coronary arteriosclerosis  ` Cardiac Monitoring   On Metoprolol , Simvastatin, ASA 81 mg, and NTG SL PRN     4.     Essential hypertension   On Amlodipine, and Metoprolol Rx ( on Hold)   PRN IV Lopressor     5.     Hyperlipidemia   Resume Simvastatin when taking PO     6.     DVT Prophylaxis   SCDs   Code Status:     FULL CODE  Family Communication:   Husband at Bedside     Disposition Plan:    Inpatient Status        Time spent:  [place] [facility] Pager [phone]   If [place] Please Contact the Day Rounding Team MD for [facility]  If 7PM-7AM, Please Contact Night-Floor Coverage  [email or web address] Password [email or web address] 05/28/2015, 5:58 AM     ADDENDUM:   Patient was seen and examined  on 05/28/2015

## 2015-05-28 NOTE — ED Notes (Signed)
Patient given yellow non-skid socks and fall bracelet. Patient assisted to bedside commode. Patient had large black/bloody stool.

## 2015-05-28 NOTE — Progress Notes (Signed)
Pt changed over from cannula to V.Mask due to decreasing in SPO2 while sleeping because pt is a mouth breather. Pt states she does this at home when she sleep and that "they" had be fussing at her about it all day. Clayton Bibles Mask wasn't successful and MD notified of pt condition. Order to place pt on CPAP at night, CPAP started and monitored pt pt continued to have periods of very shallow breathing which I decide change machine over to BIPAP.Marland Kitchen Not much change notice between the two will inform nurse of status. Pt could benefit from a sleep study being that she doesn't have oxygen or CPAP/BIPAP for home use.

## 2015-05-28 NOTE — ED Notes (Signed)
Pt states she had a colonoscopy a week ago where Dr Laural Golden removed half of a large polyp.  Pt states tonight, she had a loose stool that was black.  Pt states when she was in the bathroom she also had one bright red emesis.  Pt denies pain

## 2015-05-28 NOTE — Progress Notes (Signed)
Hospitalist on the floor to see patient. Patient has a hemoglobin of 11.8 and an order to tranfuse 1 unit of blood. Verified this with the hospitalist. She gave verbal order to hold this unit of blood for now.

## 2015-05-28 NOTE — Progress Notes (Signed)
[facility] PROGRESS NOTE  Victoria Short [id number] DOB: 03/20/1925    PCP:   Curlene Labrum, MD   HPI: Victoria Short is an 81 y.o. female admitted yesterday for upper GI Bleed from recent polypectomy and biopsy, with Hb of 11.8 grams, was in recovery after EGD this morning by Dr Dereck Leep when she was brady (SB 40's transciently), and hypotensive.  I saw her in RR, and transferred her to ICU 1.  Dr Dereck Leep was able to clip her bleeding source, and injected the bleeding source as well.  She does have hx of CAD, s/p STEMI and stents, but good EF.  She has hx of carotid disease as well.  Currently on IV PPI drip.   Rewiew of Systems:  Constitutional: Negative for malaise, fever and chills. No significant weight loss or weight gain Eyes: Negative for eye pain, redness and discharge, diplopia, visual changes, or flashes of light. ENMT: Negative for ear pain, hoarseness, nasal congestion, sinus pressure and sore throat. No headaches; tinnitus, drooling, or problem swallowing. Cardiovascular: Negative for chest pain, palpitations, diaphoresis, dyspnea and peripheral edema. ; No orthopnea, PND Respiratory: Negative for cough, hemoptysis, wheezing and stridor. No pleuritic chestpain. Gastrointestinal: Negative for nausea, vomiting, diarrhea, constipation, abdominal pain, melena, blood in stool, hematemesis, jaundice and rectal bleeding.    Genitourinary: Negative for frequency, dysuria, incontinence,flank pain and hematuria; Musculoskeletal: Negative for back pain and neck pain. Negative for swelling and trauma.;  Skin: . Negative for pruritus, rash, abrasions, bruising and skin lesion.; ulcerations Neuro: Negative for headache, lightheadedness and neck stiffness. Negative for weakness, altered level of consciousness , altered mental status, extremity weakness, burning feet, involuntary movement, seizure and syncope.  Psych: negative for anxiety, depression, insomnia, tearfulness, panic  attacks, hallucinations, paranoia, suicidal or homicidal ideation    Past Medical History  Diagnosis Date  . Hypertension   . Sleep apnea   . Blood transfusion   . GERD (gastroesophageal reflux disease)   . Pneumonia   . Anemia   . Stress-induced cardiomyopathy 05/2011    a. ?Takotubso cardiomyopathy - distribution was different than that of LAD stenosis.  . Hyperlipidemia   . Coronary artery disease     a. NSTEMI 05/2011 s/p DES to LAD, with ?Takotsubo distribution of cardiomyopathy at that time with cardiogenic shock. b. NSTEMI 06/2011 ?spasm - stable CAD, normal EF. c. Patent LAD stent, nonobst CAD by cath 08/20/12 with normal EF. d. Hx dyspnea with Brilinta.  . Hypertrophic cardiomyopathy ([place])     a. echo 08/2012: focal basal septal hypertrophy, mildly elevated gradient 9mmHg across LVOT.  . Iron deficiency     a. Hx iron infusion.  . Nasal polyp   . Carotid artery disease ([facility])     a. [id number] RICA, [id number] LICA 0000000.  . Cardiogenic shock ([place])     a. 05/2011 requiring pressor therapy.  . Mitral regurgitation   . Cancer [facility])     Skin    Past Surgical History  Procedure Laterality Date  . Appendectomy    . Partial hysterectomy    . Abdominal hysterectomy    . Wisdom tooth extraction    . Cardiac catheterization  05/2011    80% mid LAD, 40% LCX. EF: 35-40%.   . Coronary angioplasty with stent placement  05/2011    LAD: 2.5 X 16 mm Promus DES  . Cardiac catheterization  06/22/2011    patent LAD stent. Normal EF  . Cardiac catheterization  08/2012    Patent LAD stent with normal  ejection fraction.  [name] capsule study N/A 05/12/2014    Procedure: GIVENS CAPSULE STUDY;  Surgeon: Rogene Houston, MD;  [facility];  Service: Endoscopy;  Laterality: N/A;  730  . Esophagogastroduodenoscopy N/A 05/23/2014    Procedure: ESOPHAGOGASTRODUODENOSCOPY (EGD);  Surgeon: Rogene Houston, MD;  Location: [facility];  Service: Endoscopy;  Laterality: N/A;  230 - moved to  1:05 - Ann notified pt  . Left heart catheterization with coronary angiogram N/A 05/12/2011    Procedure: LEFT HEART CATHETERIZATION WITH CORONARY ANGIOGRAM;  Surgeon: Troy Sine, MD;  [facility] C[facility] LAB;  Service: Cardiovascular;  Laterality: N/A;  . Percutaneous coronary stent intervention (pci-s)  05/12/2011    Procedure: PERCUTANEOUS CORONARY STENT INTERVENTION (PCI-S);  Surgeon: Troy Sine, MD;  Location: [facility] CATH LAB;  Service: Cardiovascular;;  . Left heart catheterization with coronary angiogram N/A 06/22/2011    Procedure: LEFT HEART CATHETERIZATION WITH CORONARY ANGIOGRAM;  Surgeon: Wellington Hampshire, MD;  Location: [place] CATH LAB;  Service: Cardiovascular;  Laterality: N/A;  . Left heart catheterization with coronary angiogram N/A 08/20/2012    Procedure: LEFT HEART CATHETERIZATION WITH CORONARY ANGIOGRAM;  Surgeon: Burnell Blanks, MD;  Location: [facility] CATH LAB;  Service: Cardiovascular;  Laterality: N/A;  . Cardiac catheterization N/A 01/19/2015    Procedure: Right/Left Heart Cath and Coronary Angiography;  Surgeon: Jettie Booze, MD;  Location: [place] CV LAB;  Service: Cardiovascular;  Laterality: N/A;  . Esophagogastroduodenoscopy N/A 05/20/2015    Procedure: ESOPHAGOGASTRODUODENOSCOPY (EGD);  Surgeon: Rogene Houston, MD;  [facility];  Service: Endoscopy;  Laterality: N/A;  1:00  . Polypectomy N/A 05/20/2015    Procedure: EGD with polypectomy;  Surgeon: Rogene Houston, MD;  [facility];  Service: Endoscopy;  Laterality: N/A;    Medications:  HOME MEDS: Prior to Admission medications   Medication Sig Start Date End Date Taking? Authorizing Provider  amLODipine (NORVASC) 5 MG tablet Take 1 tablet (5 mg total) by mouth daily. Patient taking differently: Take 5 mg by mouth at bedtime.  01/12/15  Yes Herminio Commons, MD  Ascorbic Acid (VITAMIN C) 500 MG CAPS Take 500 mg by mouth daily.    Yes Historical Provider, MD  diazepam (VALIUM)  5 MG tablet Take 2.5-5 mg by mouth every 6 (six) hours as needed for anxiety.    Yes Historical Provider, MD  docusate sodium (COLACE) 100 MG capsule Take 1 capsule (100 mg total) by mouth 2 (two) times daily. 12/18/14  Yes Rogene Houston, MD  fluticasone (FLONASE) 50 MCG/ACT nasal spray Place 1 spray into both nostrils daily as needed for allergies or rhinitis (congestion).   Yes Historical Provider, MD  isosorbide dinitrate (ISORDIL) 30 MG tablet Take 30 mg by mouth 4 (four) times daily.   Yes Historical Provider, MD  metoprolol succinate (TOPROL-XL) 25 MG 24 hr tablet TAKE ONE TABLET BY MOUTH ONCE EVERY DAY 06/08/12  Yes Josue Hector, MD  nitroGLYCERIN (NITROSTAT) 0.4 MG SL tablet Place 1 tablet (0.4 mg total) under the tongue every 5 (five) minutes as needed for chest pain. 11/25/14  Yes Herminio Commons, MD  pantoprazole (PROTONIX) 40 MG tablet TAKE 1 TABLET (40 MG TOTAL) BY MOUTH DAILY BEFORE BREAKFAST. 01/07/15  Yes Butch Penny, NP  simvastatin (ZOCOR) 40 MG tablet TAKE 1 TABLET (40 MG TOTAL) BY MOUTH AT BEDTIME. 06/05/12  Yes Donney Dice, PA-C  metroNIDAZOLE (FLAGYL) 250 MG tablet Take 250 mg  by mouth 3 (three) times daily.    Historical Provider, MD  terbinafine (LAMISIL) 1 % cream Apply 1 application topically daily as needed (rash).     Historical Provider, MD  triamcinolone lotion (KENALOG) 0.1 % Apply 1 application topically 3 (three) times daily as needed (rash).     Historical Provider, MD     Allergies:  Allergies  Allergen Reactions  . Brilinta [Ticagrelor] Nausea And Vomiting and Other (See Comments)    dyspnea  . Iodine Nausea And Vomiting  . Shellfish Allergy Nausea And Vomiting and Other (See Comments)    Unsteady gait   . Streptomycin Nausea And Vomiting  . Iodinated Diagnostic Agents Nausea Only    Social History:   reports that she quit smoking about 36 years ago. Her smoking use included Cigarettes. She has a 18 pack-year smoking history. She has never used  smokeless tobacco. She reports that she does not drink alcohol or use illicit drugs.  Family History: Family History  Problem Relation Age of Onset  . Heart attack Mother 7  . Diabetic kidney disease Father 38  . Prostate cancer Father      Physical Exam: Filed Vitals:   05/28/15 0930 05/28/15 0935 05/28/15 0940 05/28/15 0945  BP:      Pulse: 90 74 66 52  Temp:      TempSrc:      Resp: 21 22 22 22   Height:      Weight:      SpO2: 97% 91% 96% 93%   Blood pressure 96/46, pulse 52, temperature 98.4 F (36.9 C), temperature source Oral, resp. rate 22, height 5\' 6"  (1.676 m), weight 59.739 kg (131 lb 11.2 oz), SpO2 93 %.  GEN:  Pleasant  patient lying in the stretcher in no acute distress; cooperative with exam.  She was pale.  PSYCH:  alert and oriented x4; does not appear anxious or depressed; affect is appropriate. HEENT: Mucous membranes pink and anicteric; PERRLA; EOM intact; no cervical lymphadenopathy nor thyromegaly or carotid bruit; no JVD; There were no stridor. Neck is very supple. Breasts:: Not examined CHEST WALL: No tenderness CHEST: Normal respiration, clear to auscultation bilaterally.  HEART: Regular rate and rhythm.  There are no murmur, rub, or gallops.   BACK: No kyphosis or scoliosis; no CVA tenderness ABDOMEN: soft and non-tender; no masses, no organomegaly, normal abdominal bowel sounds; no pannus; no intertriginous candida. There is no rebound and no distention. Rectal Exam: Not done EXTREMITIES: No bone or joint deformity; age-appropriate arthropathy of the hands and knees; no edema; no ulcerations.  There is no calf tenderness. Genitalia: not examined PULSES: 2+ and symmetric SKIN: Normal hydration no rash or ulceration CNS: Cranial nerves 2-12 grossly intact no focal lateralizing neurologic deficit.  Speech is fluent; uvula elevated with phonation, facial symmetry and tongue midline. DTR are normal bilaterally, cerebella exam is intact, barbinski is  negative and strengths are equaled bilaterally.  No sensory loss.   Labs on Admission:  Basic Metabolic Panel:  Recent Labs Lab 05/28/15 0320  NA 142  K 4.0  CL 107  CO2 30  GLUCOSE 144*  BUN 30*  CREATININE 0.67  CALCIUM 9.0   Liver Function Tests:  Recent Labs Lab 05/28/15 0320  AST 16  ALT 12*  ALKPHOS 57  BILITOT 1.0  PROT 5.8*  ALBUMIN 3.2*   No results for input(s): LIPASE, AMYLASE in the last 168 hours. No results for input(s): AMMONIA in the last 168 hours. CBC:  Recent  Labs Lab 05/28/15 0320 05/28/15 0621  WBC 6.0  --   NEUTROABS 3.7  --   HGB 11.8* 8.7*  HCT 36.2 26.3*  MCV 97.8  --   PLT 179  --    EKG: Independently reviewed.   ssessment/Plan Present on Admission:  . Anemia . Upper GI bleed . Coronary arteriosclerosis . Essential hypertension . Hyperlipidemia  PLAN:  UGIB:  She needs blood.  Will start with 2 units of PRBCs.  Her hypotension and bradycardia were likely vagal, and she is doing a little better now.  Will transfuse her to at least with a Hb of 12 g per dL if possible.  Continue with IV PPI.   Dr Dereck Leep has clipped and injected her bleeding source, so hopefully, she will stop bleeding.   Updated husband by me and Dr Dereck Leep.   Hx of CAD:  Will cycle her troponins.  Her EF was fortunately OK.   Hold Norvasc.   Other plans as per orders.  Code Status: FULL Haskel Khan, MD. [facility] Pager [phone] 7pm to 7am.  05/28/2015, 10:25 AM

## 2015-05-28 NOTE — ED Provider Notes (Signed)
CSN: [id number]     Arrival date & time 05/28/15  0303 History   First MD Initiated Contact with Patient 05/28/15 0308     Chief Complaint  Patient presents with  . GI Bleeding     (Consider location/radiation/quality/duration/timing/severity/associated sxs/prior Treatment) HPI patient reports she had a large duodenal polyp that was removed on November 16 by Dr. Laural Golden. She states there is a small amount left that he was going to go in in a few months and try to remove. She states yesterday afternoon she went to the bathroom and had a black bowel movement. She denies any abdominal pain, nausea or vomiting. However at 2 AM this morning she woke up to use the bathroom and when she was in the bathroom she had one episode of vomiting a large amount of bright red blood. She again denies abdominal pain or nausea now. She states she feels weak but she doesn't feel dizzy or lightheaded. She states she's never done this before.   PCP Dr Pleas Koch GI Dr Laural Golden  Past Medical History  Diagnosis Date  . Hypertension   . Sleep apnea   . Blood transfusion   . GERD (gastroesophageal reflux disease)   . Pneumonia   . Anemia   . Stress-induced cardiomyopathy 05/2011    a. ?Takotubso cardiomyopathy - distribution was different than that of LAD stenosis.  . Hyperlipidemia   . Coronary artery disease     a. NSTEMI 05/2011 s/p DES to LAD, with ?Takotsubo distribution of cardiomyopathy at that time with cardiogenic shock. b. NSTEMI 06/2011 ?spasm - stable CAD, normal EF. c. Patent LAD stent, nonobst CAD by cath 08/20/12 with normal EF. d. Hx dyspnea with Brilinta.  . Hypertrophic cardiomyopathy ([place])     a. echo 08/2012: focal basal septal hypertrophy, mildly elevated gradient 74mmHg across LVOT.  . Iron deficiency     a. Hx iron infusion.  . Nasal polyp   . Carotid artery disease (H[facility])     a. [id number] RICA, [id number] LICA 0000000.  . Cardiogenic shock ([place])     a. 05/2011 requiring pressor therapy.  . Mitral  regurgitation   . Cancer [facility])     Skin   Past Surgical History  Procedure Laterality Date  . Appendectomy    . Partial hysterectomy    . Abdominal hysterectomy    . Wisdom tooth extraction    . Cardiac catheterization  05/2011    80% mid LAD, 40% LCX. EF: 35-40%.   . Coronary angioplasty with stent placement  05/2011    LAD: 2.5 X 16 mm Promus DES  . Cardiac catheterization  06/22/2011    patent LAD stent. Normal EF  . Cardiac catheterization  08/2012    Patent LAD stent with normal ejection fraction.  [name] capsule study N/A 05/12/2014    Procedure: GIVENS CAPSULE STUDY;  Surgeon: Rogene Houston, MD;  [facility];  Service: Endoscopy;  Laterality: N/A;  730  . Esophagogastroduodenoscopy N/A 05/23/2014    Procedure: ESOPHAGOGASTRODUODENOSCOPY (EGD);  Surgeon: Rogene Houston, MD;  [facility];  Service: Endoscopy;  Laterality: N/A;  230 - moved to 1:05 - Ann notified pt  . Left heart catheterization with coronary angiogram N/A 05/12/2011    Procedure: LEFT HEART CATHETERIZATION WITH CORONARY ANGIOGRAM;  Surgeon: Troy Sine, MD;  Location: [facility] CATH LAB;  Service: Cardiovascular;  Laterality: N/A;  . Percutaneous coronary stent intervention (pci-s)  05/12/2011    Procedure: PERCUTANEOUS CORONARY STENT INTERVENTION (PCI-S);  Surgeon: Troy Sine, MD;  Location: [facility] CATH LAB;  Service: Cardiovascular;;  . Left heart catheterization with coronary angiogram N/A 06/22/2011    Procedure: LEFT HEART CATHETERIZATION WITH CORONARY ANGIOGRAM;  Surgeon: Wellington Hampshire, MD;  Location: [place] CATH LAB;  Service: Cardiovascular;  Laterality: N/A;  . Left heart catheterization with coronary angiogram N/A 08/20/2012    Procedure: LEFT HEART CATHETERIZATION WITH CORONARY ANGIOGRAM;  Surgeon: Burnell Blanks, MD;  Location: [facility] CATH LAB;  Service: Cardiovascular;  Laterality: N/A;  . Cardiac catheterization N/A 01/19/2015    Procedure: Right/Left Heart Cath and Coronary  Angiography;  Surgeon: Jettie Booze, MD;  Location: [place] CV LAB;  Service: Cardiovascular;  Laterality: N/A;  . Esophagogastroduodenoscopy N/A 05/20/2015    Procedure: ESOPHAGOGASTRODUODENOSCOPY (EGD);  Surgeon: Rogene Houston, MD;  [facility];  Service: Endoscopy;  Laterality: N/A;  1:00  . Polypectomy N/A 05/20/2015    Procedure: EGD with polypectomy;  Surgeon: Rogene Houston, MD;  [facility];  Service: Endoscopy;  Laterality: N/A;   Family History  Problem Relation Age of Onset  . Heart attack Mother 56  . Diabetic kidney disease Father 51  . Prostate cancer Father    Social History  Substance Use Topics  . Smoking status: Former Smoker -- 1.00 packs/day for 18 years    Types: Cigarettes    Quit date: 05/11/1979  . Smokeless tobacco: Never Used  . Alcohol Use: No   Lives at home Lives with spouse  OB History    No data available     Review of Systems  All other systems reviewed and are negative.     Allergies  Brilinta; Iodine; Shellfish allergy; Streptomycin; and Iodinated diagnostic agents  Home Medications   Prior to Admission medications   Medication Sig Start Date End Date Taking? Authorizing Provider  amLODipine (NORVASC) 5 MG tablet Take 1 tablet (5 mg total) by mouth daily. Patient taking differently: Take 5 mg by mouth at bedtime.  01/12/15  Yes Herminio Commons, MD  Ascorbic Acid (VITAMIN C) 500 MG CAPS Take 500 mg by mouth daily.    Yes Historical Provider, MD  diazepam (VALIUM) 5 MG tablet Take 2.5-5 mg by mouth every 6 (six) hours as needed for anxiety.    Yes Historical Provider, MD  docusate sodium (COLACE) 100 MG capsule Take 1 capsule (100 mg total) by mouth 2 (two) times daily. 12/18/14  Yes Rogene Houston, MD  fluticasone (FLONASE) 50 MCG/ACT nasal spray Place 1 spray into both nostrils daily as needed for allergies or rhinitis (congestion).   Yes Historical Provider, MD  isosorbide dinitrate (ISORDIL) 30  MG tablet Take 30 mg by mouth 4 (four) times daily.   Yes Historical Provider, MD  metoprolol succinate (TOPROL-XL) 25 MG 24 hr tablet TAKE ONE TABLET BY MOUTH ONCE EVERY DAY 06/08/12  Yes Josue Hector, MD  nitroGLYCERIN (NITROSTAT) 0.4 MG SL tablet Place 1 tablet (0.4 mg total) under the tongue every 5 (five) minutes as needed for chest pain. 11/25/14  Yes Herminio Commons, MD  pantoprazole (PROTONIX) 40 MG tablet TAKE 1 TABLET (40 MG TOTAL) BY MOUTH DAILY BEFORE BREAKFAST. 01/07/15  Yes Butch Penny, NP  simvastatin (ZOCOR) 40 MG tablet TAKE 1 TABLET (40 MG TOTAL) BY MOUTH AT BEDTIME. 06/05/12  Yes Burna Forts Serpe, PA-C  terbinafine (LAMISIL) 1 % cream Apply 1 application topically daily as needed (rash).    Yes Historical Provider, MD  triamcinolone lotion (KENALOG) 0.1 % Apply 1 application topically 3 (three) times daily as needed (rash).    Yes Historical Provider, MD  metroNIDAZOLE (FLAGYL) 250 MG tablet Take 250 mg by mouth 3 (three) times daily.    Historical Provider, MD   ED Triage Vitals  Enc Vitals Group     BP 05/28/15 0316 120/70 mmHg     Pulse Rate 05/28/15 0316 80     Resp 05/28/15 0316 22     Temp 05/28/15 0316 97.6 F (36.4 C)     Temp Source 05/28/15 0316 Oral     SpO2 05/28/15 0316 97 %     Weight 05/28/15 0316 133 lb (60.328 kg)     Height 05/28/15 0316 5\' 6"  (1.676 m)     Head Cir --      Peak Flow --      Pain Score --      Pain Loc --      Pain Edu? --      Excl. in [place]? --    Vital signs normal    Physical Exam  Constitutional: She is oriented to person, place, and time. She appears well-developed and well-nourished.  Non-toxic appearance. She does not appear ill. No distress.  HENT:  Head: Normocephalic and atraumatic.  Right Ear: External ear normal.  Left Ear: External ear normal.  Nose: Nose normal. No mucosal edema or rhinorrhea.  Mouth/Throat: Oropharynx is clear and moist and mucous membranes are normal. No dental abscesses or uvula swelling.   Eyes: Conjunctivae and EOM are normal. Pupils are equal, round, and reactive to light.  Neck: Normal range of motion and full passive range of motion without pain. Neck supple.  Cardiovascular: Normal rate, regular rhythm and normal heart sounds.  Exam reveals no gallop and no friction rub.   No murmur heard. Pulmonary/Chest: Effort normal and breath sounds normal. No respiratory distress. She has no wheezes. She has no rhonchi. She has no rales. She exhibits no tenderness and no crepitus.  Abdominal: Soft. Normal appearance and bowel sounds are normal. She exhibits no distension. There is no tenderness. There is no rebound and no guarding.  Musculoskeletal: Normal range of motion. She exhibits no edema or tenderness.  Moves all extremities well.   Neurological: She is alert and oriented to person, place, and time. She has normal strength. No cranial nerve deficit.  Skin: Skin is warm, dry and intact. No rash noted. No erythema. There is pallor.  Psychiatric: She has a normal mood and affect. Her speech is normal and behavior is normal. Her mood appears not anxious.  Nursing note and vitals reviewed.   ED Course  Procedures (including critical care time) Medications  0.9 %  sodium chloride infusion (not administered)  sodium chloride 0.9 % bolus 1,000 mL (1,000 mLs Intravenous New Bag/Given 05/28/15 0336)   Patient was given 1 L of normal saline after my exam. Hold clot was ordered.  I noticed at 3:45 AM that patient had become hypotensive to blood pressure 86/61. Her nurse states she has had 500 mL of her 1000 mL bolus that have been ordered. Hold clot was changed to type and screen for 1 unit.  4:20 AM blood pressure 101/62 with heart rate 75  4:05 AM Dr Laural Golden, GI, states to have hospitalist admit the patient and he will plan on doing endoscopy around 8 AM this morning. He wants her typed and crossed.  4:20 AM patient was informed of her admission and that Dr.  Rehman will be doing  endoscopy this morning.  04:40 Dr Gasper Lloyd, admit to tele, transfuse 1 unit of PRC's.   Type and screen and transfusion for 1 unit of PRC's was ordered.  Labs Review Results for orders placed or performed during the hospital encounter of 05/28/15  Comprehensive metabolic panel  Result Value Ref Range   Sodium 142 135 - 145 mmol/L   Potassium 4.0 3.5 - 5.1 mmol/L   Chloride 107 101 - 111 mmol/L   CO2 30 22 - 32 mmol/L   Glucose, Bld 144 (H) 65 - 99 mg/dL   BUN 30 (H) 6 - 20 mg/dL   Creatinine, Ser 0.67 0.44 - 1.00 mg/dL   Calcium 9.0 8.9 - 10.3 mg/dL   Total Protein 5.8 (L) 6.5 - 8.1 g/dL   Albumin 3.2 (L) 3.5 - 5.0 g/dL   AST 16 15 - 41 U/L   ALT 12 (L) 14 - 54 U/L   Alkaline Phosphatase 57 38 - 126 U/L   Total Bilirubin 1.0 0.3 - 1.2 mg/dL   GFR calc non Af Amer >60 >60 mL/min   GFR calc Af Amer >60 >60 mL/min   Anion gap 5 5 - 15  CBC with Differential  Result Value Ref Range   WBC 6.0 4.0 - 10.5 K/uL   RBC 3.70 (L) 3.87 - 5.11 MIL/uL   Hemoglobin 11.8 (L) 12.0 - 15.0 g/dL   HCT 36.2 36.0 - 46.0 %   MCV 97.8 78.0 - 100.0 fL   MCH 31.9 26.0 - 34.0 pg   MCHC 32.6 30.0 - 36.0 g/dL   RDW 14.2 11.5 - 15.5 %   Platelets 179 150 - 400 K/uL   Neutrophils Relative % 62 %   Neutro Abs 3.7 1.7 - 7.7 K/uL   Lymphocytes Relative 31 %   Lymphs Abs 1.8 0.7 - 4.0 K/uL   Monocytes Relative 5 %   Monocytes Absolute 0.3 0.1 - 1.0 K/uL   Eosinophils Relative 2 %   Eosinophils Absolute 0.1 0.0 - 0.7 K/uL   Basophils Relative 0 %   Basophils Absolute 0.0 0.0 - 0.1 K/uL  Protime-INR  Result Value Ref Range   Prothrombin Time 14.5 11.6 - 15.2 seconds   INR 1.11 0.00 - 1.49  APTT  Result Value Ref Range   aPTT 31 24 - 37 seconds  POC occult blood, ED RN will collect  Result Value Ref Range   Fecal Occult Bld POSITIVE (A) NEGATIVE  Sample to Blood Bank  Result Value Ref Range   Blood Bank Specimen BBHLD    Sample Expiration 05/29/2015    Laboratory interpretation all normal  except mild anemia however her hemoglobin was 15.0 on July 18. Elevated BUN consistent with GI bleeding    Imaging Review No results found. I have personally reviewed and evaluated these images and lab results as part of my medical decision-making.   EKG Interpretation None      MDM   Final diagnoses:  Upper GI bleeding  Hypotension, unspecified hypotension type  Anemia, unspecified anemia type    Plan admission   CRITICAL CARE Performed by: Rolland Porter L Total critical care time: 35 minutes Critical care time was exclusive of separately billable procedures and treating other patients. Critical care was necessary to treat or prevent imminent or life-threatening deterioration. Critical care was time spent personally by me on the following activities: development of treatment plan with patient and/or surrogate as well as nursing, discussions with consultants, evaluation of  patient's response to treatment, examination of patient, obtaining history from patient or surrogate, ordering and performing treatments and interventions, ordering and review of laboratory studies, ordering and review of radiographic studies, pulse oximetry and re-evaluation of patient's condition.    Rolland Porter, MD 05/28/15 [phone]

## 2015-05-28 NOTE — ED Notes (Signed)
Notified Shawnna, RN that the blood bank had called and said her blood was ready. Consent was sent with the patient to the floor.

## 2015-05-28 NOTE — Progress Notes (Signed)
Patient admitted to ICU for post polypectomy bleed residual.  Patient stable on transport. Admitted to ICU room 1.

## 2015-05-28 NOTE — Op Note (Signed)
EGD PROCEDURE REPORT  PATIENT:  Victoria Short  MR#:  [id number] Birthdate:  1925-06-04, 81 y.o., female Endoscopist:  Dr. Rogene Houston, MD  Procedure Date: 05/28/2015  Procedure:   EGD with therapeutic intervention  Indications:   Patient is 81 year old Caucasian female presents with upper GI bleed and anemia. She is status post piecemeal polypectomy and APC therapy of large duodenal polyp 8 days ago.  Patient noted melena yesterday morning and she called our office. Patient was advised to come to emergency room but she decided not to.  She had hematemesis around 2 AM this morning after she came to emergency room. She has had large bloody bowel movements as well. she denies abdominal or chest pain.            Informed Consent:  The risks, benefits, alternatives & imponderables which include, but are not limited to, bleeding, infection, perforation, drug reaction and potential missed lesion have been reviewed.  The potential for biopsy, lesion removal, esophageal dilation, etc. have also been discussed.  Questions have been answered.  All parties agreeable.  Please see history & physical in medical record for more information.  Medications:  Versed 1.5 mg IV Cetacaine spray topically for oropharyngeal anesthesia  Description of procedure:  The endoscope was introduced through the mouth and advanced to the second portion of the duodenum without difficulty or limitations. The mucosal surfaces were surveyed very carefully during advancement of the scope and upon withdrawal.  Findings:  Esophagus:   Mucosa of the esophagus was normal. GE junction was unremarkable. GEJ:  32 cm Hiatus:  40 cm Stomach:   Coffee none material noted in proximal stomach and herniated part of the stomach. Most of this was suctioned out. Fresh blood noted at pylorus. Duodenum:    Fresh blood present at duodenal bulb and post bulbar duodenum. Bulb was felt to be deformed. Active bleeding noted from polypectomy site  covered with clot. Fresh and maroonish blood noted in the second part of the duodenum.  Therapeutic/Diagnostic Maneuvers Performed:     Dilute epinephrine injected( 1 in 10,000) at the bleeding site and it stopped. I did not attempt to remove the clot. Three instinct clips applied to mucosa around clot and one at base. Site was observed for few minutes and no bleeding noted.  Total of 10 ML of dilute epinephrine was used but some of it was not injected into the tissue.  Complications:   None during the procedure. Patient complained of  Chest and epigastric pain postprocedure. Patient indicated most of her pain was in epigastric region.  EBL:  None due to intervention.  Patient had very large bloody bowel movement during the procedure.  Impression:  Large sliding hiatal hernia.  Active bleeding noted from polypectomy site at second part of the duodenum.  Bleeding controlled with combination of injection with dilute epinephrine and  clipping of bleeding site.  Recommendations:   Transfusion with PRBCs ASAP.   Patient will be transferred to ICU.  [name] U  05/28/2015  9:47 AM  CC: Dr. Curlene Labrum, MD & Dr. Rayne Du ref. provider found

## 2015-05-29 DIAGNOSIS — E785 Hyperlipidemia, unspecified: Secondary | ICD-10-CM

## 2015-05-29 DIAGNOSIS — K922 Gastrointestinal hemorrhage, unspecified: Secondary | ICD-10-CM

## 2015-05-29 DIAGNOSIS — I10 Essential (primary) hypertension: Secondary | ICD-10-CM

## 2015-05-29 LAB — BASIC METABOLIC PANEL
ANION GAP: 5 (ref 5–15)
BUN: 26 mg/dL — ABNORMAL HIGH (ref 6–20)
CHLORIDE: 109 mmol/L (ref 101–111)
CO2: 27 mmol/L (ref 22–32)
Calcium: 8.3 mg/dL — ABNORMAL LOW (ref 8.9–10.3)
Creatinine, Ser: 0.54 mg/dL (ref 0.44–1.00)
GFR calc non Af Amer: >60 mL/min (ref >60)
Glucose, Bld: 120 mg/dL — ABNORMAL HIGH (ref 65–99)
POTASSIUM: 4.4 mmol/L (ref 3.5–5.1)
Sodium: 141 mmol/L (ref 135–145)

## 2015-05-29 LAB — CBC
HCT: 25.1 % — ABNORMAL LOW (ref 36.0–46.0)
HEMATOCRIT: 28.3 % — AB (ref 36.0–46.0)
HEMATOCRIT: 27.5 % — AB (ref 36.0–46.0)
HEMOGLOBIN: 9.4 g/dL — AB (ref 12.0–15.0)
HEMOGLOBIN: 9.1 g/dL — AB (ref 12.0–15.0)
HEMOGLOBIN: 8.3 g/dL — AB (ref 12.0–15.0)
MCH: 31.2 pg (ref 26.0–34.0)
MCH: 31.3 pg (ref 26.0–34.0)
MCH: 31 pg (ref 26.0–34.0)
MCHC: 33.2 g/dL (ref 30.0–36.0)
MCHC: 33.1 g/dL (ref 30.0–36.0)
MCHC: 33.1 g/dL (ref 30.0–36.0)
MCV: 94 fL (ref 78.0–100.0)
MCV: 94.5 fL (ref 78.0–100.0)
MCV: 93.7 fL (ref 78.0–100.0)
Platelets: 107 10*3/uL — ABNORMAL LOW (ref 150–400)
Platelets: 106 10*3/uL — ABNORMAL LOW (ref 150–400)
Platelets: 111 10*3/uL — ABNORMAL LOW (ref 150–400)
RBC: 3.01 MIL/uL — AB (ref 3.87–5.11)
RBC: 2.91 MIL/uL — ABNORMAL LOW (ref 3.87–5.11)
RBC: 2.68 MIL/uL — ABNORMAL LOW (ref 3.87–5.11)
RDW: 16.9 % — ABNORMAL HIGH (ref 11.5–15.5)
RDW: 16.8 % — AB (ref 11.5–15.5)
RDW: 16.5 % — AB (ref 11.5–15.5)
WBC: 5.5 10*3/uL (ref 4.0–10.5)
WBC: 5 10*3/uL (ref 4.0–10.5)
WBC: 5 10*3/uL (ref 4.0–10.5)

## 2015-05-29 MED ORDER — CHLORHEXIDINE GLUCONATE CLOTH 2 % EX PADS
6.0000 | MEDICATED_PAD | Freq: Every day | CUTANEOUS | Status: DC
  Administered 2015-05-29: 6 via TOPICAL

## 2015-05-29 MED ORDER — MUPIROCIN 2 % EX OINT
1.0000 "application " | TOPICAL_OINTMENT | Freq: Two times a day (BID) | CUTANEOUS | Status: DC
  Administered 2015-05-29 – 2015-05-30 (×3): 1 via NASAL
  Filled 2015-05-29 (×2): qty 22

## 2015-05-29 NOTE — Care Management Important Message (Signed)
Important Message  Patient Details  Name: Victoria Short MRN: [id number] Date of Birth: Oct 08, 1924   Medicare Important Message Given:  Yes    Joylene Draft, RN 05/29/2015, 2:53 PM

## 2015-05-29 NOTE — Progress Notes (Signed)
Patient adamantly refusing to wear BIPAP tonight. Said the mask hurts her nose too much and that they tried to work with her last night but she doesn't even want to try tonight. Hospital unit in room if needed.

## 2015-05-29 NOTE — Progress Notes (Signed)
  Subjective:  Patient has no complaints. She denies chest pain shortness of breath or abdominal pain. She has not had a bowel movement in 24 hours. She ate all of her clear liquid breakfast.  Objective: Blood pressure 110/44, pulse 73, temperature 97 F (36.1 C), temperature source Oral, resp. rate 10, height 5\' 6"  (1.676 m), weight 138 lb 3.7 oz (62.7 kg), SpO2 99 %. Patient is alert and in no acute distress. Conjunctiva is pink. Sclera is nonicteric Oropharyngeal mucosa is normal. No neck masses or thyromegaly noted. Cardiac exam with regular rhythm normal S1 and S2. Grade 2/6 systolic ejection murmur best heard at left lower sternal border Abdomen is symmetrical. Bowel sounds are hyperactive. On palpation it is soft and nontender without organomegaly or masses. No LE edema or clubbing noted.  Labs/studies Results:   Recent Labs  05/28/15 2201 05/29/15 0155 05/29/15 0726  WBC 6.1 5.5 5.0  HGB 9.3* 9.4* 9.1*  HCT 27.5* 28.3* 27.5*  PLT 112* 107* 106*    BMET   Recent Labs  05/28/15 0320 05/29/15 0155  NA 142 141  K 4.0 4.4  CL 107 109  CO2 30 27  GLUCOSE 144* 120*  BUN 30* 26*  CREATININE 0.67 0.54  CALCIUM 9.0 8.3*    LFT   Recent Labs  05/28/15 0320  PROT 5.8*  ALBUMIN 3.2*  AST 16  ALT 12*  ALKPHOS 57  BILITOT 1.0    PT/INR   Recent Labs  05/28/15 0320 05/28/15 1810  LABPROT 14.5 15.5*  INR 1.11 1.22     Assessment:  #1. Delayed post polypectomy upper GI bleed from duodenal polypectomy site. Patient is status post therapeutic EGD 24 hours ago. She has stopped bleeding. She has received 2 units of PRBCs. Renal function is normal. If she does not she should be able to go home tomorrow morning. #2. Postprocedure epigastric pain has resolved. Etiology most likely secondary to tissue edema from injection therapy. #3. Anemia secondary to upper GI bleed.    Recommendations:  Advance diet. Decrease IV fluid rate to 50 ml/hr. Continue IV  pantoprazole until the time of discharge. Continue sucralfate for 4 weeks.

## 2015-05-29 NOTE — Care Management Note (Signed)
Case Management Note  Patient Details  Name: Victoria Short MRN: [id number] Date of Birth: June 17, 1925  Subjective/Objective:                  Pt admitted from with gi bleed. Pt lives with her husband and will return home at discharge. Pt is independent with ADL's.  Action/Plan: No Cm needs anticipated at discharge.   Expected Discharge Date:                  Expected Discharge Plan:  Home/Self Care  In-House Referral:  NA  Discharge planning Services  CM Consult  Post Acute Care Choice:  NA Choice offered to:  NA  DME Arranged:    DME Agency:     HH Arranged:    HH Agency:     Status of Service:  Completed, signed off  Medicare Important Message Given:    Date Medicare IM Given:    Medicare IM give by:    Date Additional Medicare IM Given:    Additional Medicare Important Message give by:     If discussed at [place] of Stay Meetings, dates discussed:    Additional Comments:  Joylene Draft, RN 05/29/2015, 2:52 PM

## 2015-05-29 NOTE — Progress Notes (Signed)
[facility] PROGRESS NOTE  Victoria Short [id number] DOB: 05-24-1925    PCP:   Curlene Labrum, MD   HPI:  Victoria Short is an 81 y.o. female admitted yesterday for upper GI Bleed from recent polypectomy and biopsy, with Hb of 11.8 grams, was in recovery after EGD this morning by Dr Dereck Leep when she was brady (SB 40's transciently), and hypotensive. I saw her in RR, and transferred her to ICU 1. Dr Dereck Leep was able to clip her bleeding source, and injected the bleeding source as well. She does have hx of CAD, s/p STEMI and stents, but good EF. She has hx of carotid disease as well. Currently on IV PPI drip. She felt better this am, and wanted to go home.  Her Hb has been stable at 9 grams per dL.  No abdominal pain, and maintained stable hemodynamics.    Rewiew of Systems:  Constitutional: Negative for malaise, fever and chills. No significant weight loss or weight gain Eyes: Negative for eye pain, redness and discharge, diplopia, visual changes, or flashes of light. ENMT: Negative for ear pain, hoarseness, nasal congestion, sinus pressure and sore throat. No headaches; tinnitus, drooling, or problem swallowing. Cardiovascular: Negative for chest pain, palpitations, diaphoresis, dyspnea and peripheral edema. ; No orthopnea, PND Respiratory: Negative for cough, hemoptysis, wheezing and stridor. No pleuritic chestpain. Gastrointestinal: Negative for nausea, vomiting, diarrhea, constipation, abdominal pain, melena, blood in stool, hematemesis, jaundice and rectal bleeding.    Genitourinary: Negative for frequency, dysuria, incontinence,flank pain and hematuria; Musculoskeletal: Negative for back pain and neck pain. Negative for swelling and trauma.;  Skin: . Negative for pruritus, rash, abrasions, bruising and skin lesion.; ulcerations Neuro: Negative for headache, lightheadedness and neck stiffness. Negative for weakness, altered level of consciousness , altered mental status,  extremity weakness, burning feet, involuntary movement, seizure and syncope.  Psych: negative for anxiety, depression, insomnia, tearfulness, panic attacks, hallucinations, paranoia, suicidal or homicidal ideation    Past Medical History  Diagnosis Date  . Hypertension   . Sleep apnea   . Blood transfusion   . GERD (gastroesophageal reflux disease)   . Pneumonia   . Anemia   . Stress-induced cardiomyopathy 05/2011    a. ?Takotubso cardiomyopathy - distribution was different than that of LAD stenosis.  . Hyperlipidemia   . Coronary artery disease     a. NSTEMI 05/2011 s/p DES to LAD, with ?Takotsubo distribution of cardiomyopathy at that time with cardiogenic shock. b. NSTEMI 06/2011 ?spasm - stable CAD, normal EF. c. Patent LAD stent, nonobst CAD by cath 08/20/12 with normal EF. d. Hx dyspnea with Brilinta.  . Hypertrophic cardiomyopathy ([place])     a. echo 08/2012: focal basal septal hypertrophy, mildly elevated gradient 31mmHg across LVOT.  . Iron deficiency     a. Hx iron infusion.  . Nasal polyp   . Carotid artery disease ([facility])     a. [id number] RICA, [id number] LICA 0000000.  . Cardiogenic shock ([place])     a. 05/2011 requiring pressor therapy.  . Mitral regurgitation   . Cancer [facility])     Skin    Past Surgical History  Procedure Laterality Date  . Appendectomy    . Partial hysterectomy    . Abdominal hysterectomy    . Wisdom tooth extraction    . Cardiac catheterization  05/2011    80% mid LAD, 40% LCX. EF: 35-40%.   . Coronary angioplasty with stent placement  05/2011    LAD: 2.5 X 16 mm Promus DES  .  Cardiac catheterization  06/22/2011    patent LAD stent. Normal EF  . Cardiac catheterization  08/2012    Patent LAD stent with normal ejection fraction.  [name] capsule study N/A 05/12/2014    Procedure: GIVENS CAPSULE STUDY;  Surgeon: Rogene Houston, MD;  [facility];  Service: Endoscopy;  Laterality: N/A;  730  . Esophagogastroduodenoscopy N/A 05/23/2014     Procedure: ESOPHAGOGASTRODUODENOSCOPY (EGD);  Surgeon: Rogene Houston, MD;  Location[facility];  Service: Endoscopy;  Laterality: N/A;  230 - moved to 1:05 - Ann notified pt  . Left heart catheterization with coronary angiogram N/A 05/12/2011    Procedure: LEFT HEART CATHETERIZATION WITH CORONARY ANGIOGRAM;  Surgeon: Troy Sine, MD;  Location: [facility] CATH LAB;  Service: Cardiovascular;  Laterality: N/A;  . Percutaneous coronary stent intervention (pci-s)  05/12/2011    Procedure: PERCUTANEOUS CORONARY STENT INTERVENTION (PCI-S);  Surgeon: Troy Sine, MD;  Location: [facility] CATH LAB;  Service: Cardiovascular;;  . Left heart catheterization with coronary angiogram N/A 06/22/2011    Procedure: LEFT HEART CATHETERIZATION WITH CORONARY ANGIOGRAM;  Surgeon: Wellington Hampshire, MD;  Location: [place] CATH LAB;  Service: Cardiovascular;  Laterality: N/A;  . Left heart catheterization with coronary angiogram N/A 08/20/2012    Procedure: LEFT HEART CATHETERIZATION WITH CORONARY ANGIOGRAM;  Surgeon: Burnell Blanks, MD;  Location: [facility] CATH LAB;  Service: Cardiovascular;  Laterality: N/A;  . Cardiac catheterization N/A 01/19/2015    Procedure: Right/Left Heart Cath and Coronary Angiography;  Surgeon: Jettie Booze, MD;  Location: [place] CV LAB;  Service: Cardiovascular;  Laterality: N/A;  . Esophagogastroduodenoscopy N/A 05/20/2015    Procedure: ESOPHAGOGASTRODUODENOSCOPY (EGD);  Surgeon: Rogene Houston, MD;  [facility];  Service: Endoscopy;  Laterality: N/A;  1:00  . Polypectomy N/A 05/20/2015    Procedure: EGD with polypectomy;  Surgeon: Rogene Houston, MD;  [facility];  Service: Endoscopy;  Laterality: N/A;    Medications:  HOME MEDS: Prior to Admission medications   Medication Sig Start Date End Date Taking? Authorizing Provider  amLODipine (NORVASC) 5 MG tablet Take 1 tablet (5 mg total) by mouth daily. Patient taking differently: Take 5 mg by mouth at bedtime.   01/12/15  Yes Herminio Commons, MD  Ascorbic Acid (VITAMIN C) 500 MG CAPS Take 500 mg by mouth daily.    Yes Historical Provider, MD  fluticasone (FLONASE) 50 MCG/ACT nasal spray Place 1 spray into both nostrils daily as needed for allergies or rhinitis (congestion).   Yes Historical Provider, MD  isosorbide dinitrate (ISORDIL) 30 MG tablet Take 30 mg by mouth 4 (four) times daily.   Yes Historical Provider, MD  metoprolol succinate (TOPROL-XL) 25 MG 24 hr tablet TAKE ONE TABLET BY MOUTH ONCE EVERY DAY 06/08/12  Yes Josue Hector, MD  nitroGLYCERIN (NITROSTAT) 0.4 MG SL tablet Place 1 tablet (0.4 mg total) under the tongue every 5 (five) minutes as needed for chest pain. 11/25/14  Yes Herminio Commons, MD  pantoprazole (PROTONIX) 40 MG tablet TAKE 1 TABLET (40 MG TOTAL) BY MOUTH DAILY BEFORE BREAKFAST. 01/07/15  Yes Butch Penny, NP  simvastatin (ZOCOR) 40 MG tablet TAKE 1 TABLET (40 MG TOTAL) BY MOUTH AT BEDTIME. 06/05/12  Yes Burna Forts Serpe, PA-C  metroNIDAZOLE (FLAGYL) 250 MG tablet Take 250 mg by mouth 3 (three) times daily.    Historical Provider, MD     Allergies:  Allergies  Allergen Reactions  . Brilinta [Ticagrelor]  Nausea And Vomiting and Other (See Comments)    dyspnea  . Iodine Nausea And Vomiting  . Shellfish Allergy Nausea And Vomiting and Other (See Comments)    Unsteady gait   . Streptomycin Nausea And Vomiting  . Iodinated Diagnostic Agents Nausea Only    Social History:   reports that she quit smoking about 36 years ago. Her smoking use included Cigarettes. She has a 18 pack-year smoking history. She has never used smokeless tobacco. She reports that she does not drink alcohol or use illicit drugs.  Family History: Family History  Problem Relation Age of Onset  . Heart attack Mother 62  . Diabetic kidney disease Father 91  . Prostate cancer Father      Physical Exam: Filed Vitals:   05/29/15 0700 05/29/15 0730 05/29/15 0800 05/29/15 0900  BP: 104/38  133/47  110/44  Pulse: 77  74 73  Temp:  97 F (36.1 C)    TempSrc:  Oral    Resp: 21  23 10   Height:      Weight:      SpO2: 93%  92% 99%   Blood pressure 110/44, pulse 73, temperature 97 F (36.1 C), temperature source Oral, resp. rate 10, height 5\' 6"  (1.676 m), weight 62.7 kg (138 lb 3.7 oz), SpO2 99 %.  GEN:  Pleasant patient lying in the stretcher in no acute distress; cooperative with exam. PSYCH:  alert and oriented x4; does not appear anxious or depressed; affect is appropriate. HEENT: Mucous membranes pink and anicteric; PERRLA; EOM intact; no cervical lymphadenopathy nor thyromegaly or carotid bruit; no JVD; There were no stridor. Neck is very supple. Breasts:: Not examined CHEST WALL: No tenderness CHEST: Normal respiration, clear to auscultation bilaterally.  HEART: Regular rate and rhythm.  There are no murmur, rub, or gallops.   BACK: No kyphosis or scoliosis; no CVA tenderness ABDOMEN: soft and non-tender; no masses, no organomegaly, normal abdominal bowel sounds; no pannus; no intertriginous candida. There is no rebound and no distention. Rectal Exam: Not done EXTREMITIES: No bone or joint deformity; age-appropriate arthropathy of the hands and knees; no edema; no ulcerations.  There is no calf tenderness. Genitalia: not examined PULSES: 2+ and symmetric SKIN: Normal hydration no rash or ulceration CNS: Cranial nerves 2-12 grossly intact no focal lateralizing neurologic deficit.  Speech is fluent; uvula elevated with phonation, facial symmetry and tongue midline. DTR are normal bilaterally, cerebella exam is intact, barbinski is negative and strengths are equaled bilaterally.  No sensory loss.   Labs on Admission:  Basic Metabolic Panel:  Recent Labs Lab 05/28/15 0320 05/29/15 0155  NA 142 141  K 4.0 4.4  CL 107 109  CO2 30 27  GLUCOSE 144* 120*  BUN 30* 26*  CREATININE 0.67 0.54  CALCIUM 9.0 8.3*   Liver Function Tests:  Recent Labs Lab 05/28/15 0320  AST  16  ALT 12*  ALKPHOS 57  BILITOT 1.0  PROT 5.8*  ALBUMIN 3.2*   No results for input(s): LIPASE, AMYLASE in the last 168 hours. No results for input(s): AMMONIA in the last 168 hours. CBC:  Recent Labs Lab 05/28/15 0320 05/28/15 0621 05/28/15 1810 05/28/15 2201 05/29/15 0155 05/29/15 0726  WBC 6.0  --  6.6 6.1 5.5 5.0  NEUTROABS 3.7  --   --   --   --   --   HGB 11.8* 8.7* 10.3* 9.3* 9.4* 9.1*  HCT 36.2 26.3* 31.2* 27.5* 28.3* 27.5*  MCV 97.8  --  94.0 93.5 94.0 94.5  PLT 179  --  116* 112* 107* 106*    Assessment/Plan Present on Admission:  . Anemia . Upper GI bleed . Coronary arteriosclerosis . Essential hypertension . Hyperlipidemia  PLAN: Will keep inpatient today.  Change H and H to every 12 hours.  Transfer to the floor without telemetry OK today.  Will d/c IV PPI drip, and change to IV Q12.  If she remains stable, plan to discharge her tomorrow.  Other plans as per orders.  Code Status: FULL Haskel Khan, MD. [facility] Pager [phone] 7pm to 7am.  05/29/2015, 10:37 AM

## 2015-05-29 NOTE — Progress Notes (Signed)
Patient ID: Victoria Short, female   DOB: 02-24-1925, 81 y.o.   MRN: [id number]  Assessment/Plan: ADMITTED WITH POSTPOLYPECTOMY BLEED-S/P EGD NOV 24. BP LABILE/HB STABLE.  PLAN:  1. SUPPORTIVE CARE 2. NO INDICATION FOR ENDOSCOPY AT THIS TIME. 3. ADVANCE DIET    Subjective: Since pt was last evaluated the patient SHE HAD ONE SML CLOT. TOLERATING CLEAR LIQUIDS.NO ABDOMINAL PAIN, NAUSEA, OR VOMITING.  Objective: Vital signs in last 24 hours: Filed Vitals:   05/29/15 0800 05/29/15 0900  BP: 133/47 110/44  Pulse: 74 73  Temp:    Resp: 23 10   General appearance: alert, cooperative and no distress Resp: clear to auscultation bilaterally Cardio: regular rate and rhythm GI: soft, non-tender; bowel sounds normal  Lab Results:  HB 9.1 PLT CT 106 K  Studies/Results: No results found.  Medications: I have reviewed the patient's current medications.   LOS: 5 days   Barney Drain 12/12/2013, 2:23 PM

## 2015-05-30 LAB — HEMOGLOBIN AND HEMATOCRIT, BLOOD
HEMATOCRIT: 26.5 % — AB (ref 36.0–46.0)
HEMOGLOBIN: 8.8 g/dL — AB (ref 12.0–15.0)

## 2015-05-30 NOTE — Progress Notes (Signed)
Nurse supervisor advised to contact Hospitalist before reinstating IV due to pt being released later this morning.  Dr Rogue Bussing (Hospitalist) advised to leave it out.

## 2015-05-30 NOTE — Progress Notes (Signed)
Pt's IV catheter removed and intact. Pt's IV site clean dry and intact. Discharge instructions reviewed and discussed with patient. All questions were answered and no further questions at this time. Pt in stable condition and in no acute distress at this time. Pt escorted by nurse tech.

## 2015-05-30 NOTE — Discharge Summary (Signed)
Physician Discharge Summary  Victoria Short [id number] DOB: 12-26-1924 DOA: 05/28/2015  PCP: Curlene Labrum, MD  Admit date: 05/28/2015 Discharge date: 05/30/2015  Time spent: 35 minutes  Recommendations for Outpatient Follow-up:  1. Follow up with Dr Dereck Leep next week.   Discharge Diagnoses:  Principal Problem:   Upper GI bleed Active Problems:   Hyperlipidemia   Coronary arteriosclerosis   Essential hypertension   Anemia   Upper GI bleeding   Discharge Condition: Stable.  No further melena, dizziness, or weakness.  Able to eat.   Diet recommendation: as tolerated  Filed Weights   05/28/15 0538 05/29/15 0500 05/29/15 2019  Weight: 59.739 kg (131 lb 11.2 oz) 62.7 kg (138 lb 3.7 oz) 63.322 kg (139 lb 9.6 oz)    History of present illness: patient was admitted by Dr Arnoldo Morale for upper GI bleed, presented as hematemesis, on May 28, 2015.  As per her H and P: " Victoria Short is a 81 y.o. female with a history of CAD with Stent x 1, HTN, GERD, OSA , and Anemia who presented to the ED with complaints of hematemesis yesterday afternoon, with associated symptoms of weakness and light headedness, and later she passed black stool. She denies any ABD pain. She was seen in the ED, and her admission Hb was found at 11, and in July 2016 her last hemoglobin was 15. The EDp contacted GI on call Dr Felipe Drone for consultation, and he advised that patient be admitted by the Hospitalists service and he would see her this am. OF Note: She had an endoscopy performed by dr Laural Golden which had revealed a large duodenal polyp that was excised on November 16.   Hospital Course: Patient was admitted to the floor, and she was seen by Dr Dereck Leep the following day.  She was given IVF, and he took her to the OR and performed an EGD.  He found the bleeding at the site of prior biopsy, and clipped it, along with injecting it.  She was quite hypotensive and bradycardic, and was brought to the ICU, she was  given blood, along with IV PPI for one day.   She stopped her bleeding, anxious to go home, but was kept an extra day to be sure she stopped her bleeding, which she did.  Her Hb remained stable at around 9 g per DL.  Her hemodynamic stabalized and remained stable.  She will be discharged today and follow up with Dr Dereck Leep next week.  She will avoid ASA and NSAIDS, and continue with her PPI orally.  Thank you for asking me to participate in her care.    Procedures:  EGD:  Dr Dereck Leep.   Consultations:  GI:  Dr Dereck Leep.  Discharge Exam: Filed Vitals:   05/30/15 0036 05/30/15 0529  BP: 114/39 140/42  Pulse: 71 82  Temp: 98.2 F (36.8 C) 97.8 F (36.6 C)  Resp: 18 20    Discharge Instructions   Discharge Instructions    Diet - low sodium heart healthy    Complete by:  As directed      Increase activity slowly    Complete by:  As directed           Current Discharge Medication List    CONTINUE these medications which have NOT CHANGED   Details  amLODipine (NORVASC) 5 MG tablet Take 1 tablet (5 mg total) by mouth daily. Qty: 180 tablet, Refills: 3    Ascorbic Acid (VITAMIN C) 500 MG CAPS Take 500 mg by  mouth daily.     fluticasone (FLONASE) 50 MCG/ACT nasal spray Place 1 spray into both nostrils daily as needed for allergies or rhinitis (congestion).    isosorbide dinitrate (ISORDIL) 30 MG tablet Take 30 mg by mouth 4 (four) times daily.    metoprolol succinate (TOPROL-XL) 25 MG 24 hr tablet TAKE ONE TABLET BY MOUTH ONCE EVERY DAY Qty: 30 tablet, Refills: 5    nitroGLYCERIN (NITROSTAT) 0.4 MG SL tablet Place 1 tablet (0.4 mg total) under the tongue every 5 (five) minutes as needed for chest pain. Qty: 25 tablet, Refills: 3    pantoprazole (PROTONIX) 40 MG tablet TAKE 1 TABLET (40 MG TOTAL) BY MOUTH DAILY BEFORE BREAKFAST. Qty: 30 tablet, Refills: 4    simvastatin (ZOCOR) 40 MG tablet TAKE 1 TABLET (40 MG TOTAL) BY MOUTH AT BEDTIME. Qty: 30 tablet, Refills: 3       STOP taking these medications     metroNIDAZOLE (FLAGYL) 250 MG tablet        Allergies  Allergen Reactions  . Brilinta [Ticagrelor] Nausea And Vomiting and Other (See Comments)    dyspnea  . Iodine Nausea And Vomiting  . Shellfish Allergy Nausea And Vomiting and Other (See Comments)    Unsteady gait   . Streptomycin Nausea And Vomiting  . Iodinated Diagnostic Agents Nausea Only      The results of significant diagnostics from this hospitalization (including imaging, microbiology, ancillary and laboratory) are listed below for reference.    Significant Diagnostic Studies: No results found.  Microbiology: Recent Results (from the past 240 hour(s))  Surgical pcr screen     Status: Abnormal   Collection Time: 05/28/15 10:49 AM  Result Value Ref Range Status   MRSA, PCR POSITIVE (A) NEGATIVE Final    Comment: RESULT CALLED TO, READ BACK BY AND VERIFIED WITH: MCDANIEL,M ON 05/28/15 AT 1425 BY LOY,C    Staphylococcus aureus POSITIVE (A) NEGATIVE Final    Comment:        The Xpert SA Assay (FDA approved for NASAL specimens in patients over 52 years of age), is one component of a comprehensive surveillance program.  Test performance has been validated by [facility] for patients greater than or equal to 36 year old. It is not intended to diagnose infection nor to guide or monitor treatment. RESULT CALLED TO, READ BACK BY AND VERIFIED WITH: MCDANIEL,M ON 05/28/15 AT 1425 BY LOY,C      Labs: Basic Metabolic Panel:  Recent Labs Lab 05/28/15 0320 05/29/15 0155  NA 142 141  K 4.0 4.4  CL 107 109  CO2 30 27  GLUCOSE 144* 120*  BUN 30* 26*  CREATININE 0.67 0.54  CALCIUM 9.0 8.3*   Liver Function Tests:  Recent Labs Lab 05/28/15 0320  AST 16  ALT 12*  ALKPHOS 57  BILITOT 1.0  PROT 5.8*  ALBUMIN 3.2*   CBC:  Recent Labs Lab 05/28/15 0320  05/28/15 1810 05/28/15 2201 05/29/15 0155 05/29/15 0726 05/29/15 2142 05/30/15 0845  WBC 6.0  --  6.6 6.1  5.5 5.0 5.0  --   NEUTROABS 3.7  --   --   --   --   --   --   --   HGB 11.8*  < > 10.3* 9.3* 9.4* 9.1* 8.3* 8.8*  HCT 36.2  < > 31.2* 27.5* 28.3* 27.5* 25.1* 26.5*  MCV 97.8  --  94.0 93.5 94.0 94.5 93.7  --   PLT 179  --  116* 112* 107* 106*  111*  --   < > = values in this interval not displayed.   SignedOrvan Falconer  [facility] 05/30/2015, 11:06 AM

## 2015-06-01 LAB — TYPE AND SCREEN
ABO/RH(D): A POS
Antibody Screen: NEGATIVE
UNIT DIVISION: 0
UNIT DIVISION: 0
UNIT DIVISION: 0
Unit division: 0

## 2015-06-02 ENCOUNTER — Other Ambulatory Visit (INDEPENDENT_AMBULATORY_CARE_PROVIDER_SITE_OTHER): Payer: Self-pay | Admitting: Internal Medicine

## 2015-06-02 ENCOUNTER — Encounter (HOSPITAL_COMMUNITY): Payer: Self-pay | Admitting: Internal Medicine

## 2015-06-02 ENCOUNTER — Telehealth (INDEPENDENT_AMBULATORY_CARE_PROVIDER_SITE_OTHER): Payer: Self-pay | Admitting: *Deleted

## 2015-06-02 MED ORDER — ISOSORBIDE DINITRATE 30 MG PO TABS: 30.0000 mg | ORAL_TABLET | Freq: Every day | ORAL | Status: DC

## 2015-06-02 NOTE — Telephone Encounter (Signed)
Patient called and stats over voicemail that she was discharged from hospital and she has questions. She would appreciate a call from Eleva or Terri prior to our closing today. Please call [phone].

## 2015-06-02 NOTE — Telephone Encounter (Signed)
Patient's call returned.. She is feeling better. She had a question about Isordil dose. It says 30 mg 4 times a day but she supposed to be taking once a day. New prescription sent to [facility] in [facility] for 30 mg daily. Have blood work by Dr. Pleas Koch tomorrow.

## 2015-06-03 DIAGNOSIS — D509 Iron deficiency anemia, unspecified: Secondary | ICD-10-CM | POA: Diagnosis not present

## 2015-06-03 DIAGNOSIS — Z86010 Personal history of colonic polyps: Secondary | ICD-10-CM | POA: Diagnosis not present

## 2015-06-03 DIAGNOSIS — R531 Weakness: Secondary | ICD-10-CM | POA: Diagnosis not present

## 2015-06-03 DIAGNOSIS — I10 Essential (primary) hypertension: Secondary | ICD-10-CM | POA: Diagnosis not present

## 2015-06-03 DIAGNOSIS — R0602 Shortness of breath: Secondary | ICD-10-CM | POA: Diagnosis not present

## 2015-06-03 DIAGNOSIS — E119 Type 2 diabetes mellitus without complications: Secondary | ICD-10-CM | POA: Diagnosis not present

## 2015-06-03 DIAGNOSIS — R110 Nausea: Secondary | ICD-10-CM | POA: Diagnosis not present

## 2015-06-03 DIAGNOSIS — Z79899 Other long term (current) drug therapy: Secondary | ICD-10-CM | POA: Diagnosis not present

## 2015-06-03 DIAGNOSIS — I252 Old myocardial infarction: Secondary | ICD-10-CM | POA: Diagnosis not present

## 2015-06-03 DIAGNOSIS — E559 Vitamin D deficiency, unspecified: Secondary | ICD-10-CM | POA: Diagnosis not present

## 2015-06-03 DIAGNOSIS — E782 Mixed hyperlipidemia: Secondary | ICD-10-CM | POA: Diagnosis not present

## 2015-06-03 DIAGNOSIS — R51 Headache: Secondary | ICD-10-CM | POA: Diagnosis not present

## 2015-06-03 DIAGNOSIS — K219 Gastro-esophageal reflux disease without esophagitis: Secondary | ICD-10-CM | POA: Diagnosis not present

## 2015-06-10 DIAGNOSIS — K317 Polyp of stomach and duodenum: Secondary | ICD-10-CM | POA: Diagnosis not present

## 2015-06-10 DIAGNOSIS — E559 Vitamin D deficiency, unspecified: Secondary | ICD-10-CM | POA: Diagnosis not present

## 2015-06-10 DIAGNOSIS — I10 Essential (primary) hypertension: Secondary | ICD-10-CM | POA: Diagnosis not present

## 2015-06-10 DIAGNOSIS — E782 Mixed hyperlipidemia: Secondary | ICD-10-CM | POA: Diagnosis not present

## 2015-06-10 DIAGNOSIS — E119 Type 2 diabetes mellitus without complications: Secondary | ICD-10-CM | POA: Diagnosis not present

## 2015-06-10 DIAGNOSIS — D509 Iron deficiency anemia, unspecified: Secondary | ICD-10-CM | POA: Diagnosis not present

## 2015-06-10 DIAGNOSIS — D62 Acute posthemorrhagic anemia: Secondary | ICD-10-CM | POA: Diagnosis not present

## 2015-06-12 ENCOUNTER — Encounter (INDEPENDENT_AMBULATORY_CARE_PROVIDER_SITE_OTHER): Payer: Self-pay

## 2015-07-29 DIAGNOSIS — J019 Acute sinusitis, unspecified: Secondary | ICD-10-CM | POA: Diagnosis not present

## 2015-07-29 DIAGNOSIS — I10 Essential (primary) hypertension: Secondary | ICD-10-CM | POA: Diagnosis not present

## 2015-07-29 DIAGNOSIS — E119 Type 2 diabetes mellitus without complications: Secondary | ICD-10-CM | POA: Diagnosis not present

## 2015-08-06 ENCOUNTER — Telehealth (INDEPENDENT_AMBULATORY_CARE_PROVIDER_SITE_OTHER): Payer: Self-pay | Admitting: Internal Medicine

## 2015-08-06 NOTE — Telephone Encounter (Signed)
Ms. Jobst came in asking if her current Rx of Isosorbide DN 30MG  can be changed to Isosorbide Mononitrate 30mg  ER tab. This was at the request of the pharmacist at [facility] in [place]. They were told the Rx can't be refilled until changed. She has three pills left. Please give her a call if needed.  Pt's ph# [phone] and [phone] Thank you.

## 2015-08-07 NOTE — Telephone Encounter (Signed)
Per Dr.Rehman - may call in the  Isosorbide Mononitrate 30 mg - Patient will take 1 by mouth daily #30 with 5 refills. Further refills will need to go to PCP. This was called to [facility].  Patient was called and a message was left with the above information.

## 2015-08-10 ENCOUNTER — Telehealth (INDEPENDENT_AMBULATORY_CARE_PROVIDER_SITE_OTHER): Payer: Self-pay | Admitting: Internal Medicine

## 2015-08-10 NOTE — Telephone Encounter (Signed)
Pt's husband left a message wondering if we've sent in a Rx for the patient but he didn't leave specifics.   Pt's ph# [phone]  Thank you.

## 2015-08-10 NOTE — Telephone Encounter (Signed)
Pharmacy was called on Friday for this medication change and refills. A message was left as well for the patient as a reminder. I have called the number that was provided this morning. Got voice mail and left another message.

## 2015-09-04 DIAGNOSIS — D519 Vitamin B12 deficiency anemia, unspecified: Secondary | ICD-10-CM | POA: Diagnosis not present

## 2015-09-04 DIAGNOSIS — D649 Anemia, unspecified: Secondary | ICD-10-CM | POA: Diagnosis not present

## 2015-09-04 DIAGNOSIS — E782 Mixed hyperlipidemia: Secondary | ICD-10-CM | POA: Diagnosis not present

## 2015-09-04 DIAGNOSIS — D509 Iron deficiency anemia, unspecified: Secondary | ICD-10-CM | POA: Diagnosis not present

## 2015-09-04 DIAGNOSIS — I10 Essential (primary) hypertension: Secondary | ICD-10-CM | POA: Diagnosis not present

## 2015-09-04 DIAGNOSIS — E119 Type 2 diabetes mellitus without complications: Secondary | ICD-10-CM | POA: Diagnosis not present

## 2015-09-08 DIAGNOSIS — L918 Other hypertrophic disorders of the skin: Secondary | ICD-10-CM | POA: Diagnosis not present

## 2015-09-08 DIAGNOSIS — K317 Polyp of stomach and duodenum: Secondary | ICD-10-CM | POA: Diagnosis not present

## 2015-09-08 DIAGNOSIS — I10 Essential (primary) hypertension: Secondary | ICD-10-CM | POA: Diagnosis not present

## 2015-09-08 DIAGNOSIS — E119 Type 2 diabetes mellitus without complications: Secondary | ICD-10-CM | POA: Diagnosis not present

## 2015-09-08 DIAGNOSIS — E782 Mixed hyperlipidemia: Secondary | ICD-10-CM | POA: Diagnosis not present

## 2015-09-08 DIAGNOSIS — D62 Acute posthemorrhagic anemia: Secondary | ICD-10-CM | POA: Diagnosis not present

## 2015-09-08 DIAGNOSIS — D509 Iron deficiency anemia, unspecified: Secondary | ICD-10-CM | POA: Diagnosis not present

## 2015-09-09 DIAGNOSIS — D649 Anemia, unspecified: Secondary | ICD-10-CM | POA: Diagnosis not present

## 2015-09-15 ENCOUNTER — Encounter (INDEPENDENT_AMBULATORY_CARE_PROVIDER_SITE_OTHER): Payer: Self-pay | Admitting: *Deleted

## 2015-11-03 ENCOUNTER — Telehealth: Payer: Self-pay | Admitting: Cardiovascular Disease

## 2015-11-03 NOTE — Telephone Encounter (Signed)
Victoria Short called requesting that we check with Dr. Bronson Ing to see if she needs to have a stress test in May 2017.

## 2015-11-03 NOTE — Telephone Encounter (Signed)
Do not see an indication for stress test according to last result notes or office notes. LM for pt

## 2015-11-19 DIAGNOSIS — R5383 Other fatigue: Secondary | ICD-10-CM | POA: Diagnosis not present

## 2015-11-19 DIAGNOSIS — E119 Type 2 diabetes mellitus without complications: Secondary | ICD-10-CM | POA: Diagnosis not present

## 2015-11-19 DIAGNOSIS — E559 Vitamin D deficiency, unspecified: Secondary | ICD-10-CM | POA: Diagnosis not present

## 2015-11-19 DIAGNOSIS — D509 Iron deficiency anemia, unspecified: Secondary | ICD-10-CM | POA: Diagnosis not present

## 2015-11-19 DIAGNOSIS — I10 Essential (primary) hypertension: Secondary | ICD-10-CM | POA: Diagnosis not present

## 2015-11-19 DIAGNOSIS — R739 Hyperglycemia, unspecified: Secondary | ICD-10-CM | POA: Diagnosis not present

## 2015-11-19 DIAGNOSIS — E782 Mixed hyperlipidemia: Secondary | ICD-10-CM | POA: Diagnosis not present

## 2015-11-24 DIAGNOSIS — E119 Type 2 diabetes mellitus without complications: Secondary | ICD-10-CM | POA: Diagnosis not present

## 2015-11-24 DIAGNOSIS — D62 Acute posthemorrhagic anemia: Secondary | ICD-10-CM | POA: Diagnosis not present

## 2015-11-24 DIAGNOSIS — R5383 Other fatigue: Secondary | ICD-10-CM | POA: Diagnosis not present

## 2015-11-24 DIAGNOSIS — D509 Iron deficiency anemia, unspecified: Secondary | ICD-10-CM | POA: Diagnosis not present

## 2015-11-24 DIAGNOSIS — K317 Polyp of stomach and duodenum: Secondary | ICD-10-CM | POA: Diagnosis not present

## 2015-11-24 DIAGNOSIS — I10 Essential (primary) hypertension: Secondary | ICD-10-CM | POA: Diagnosis not present

## 2015-11-24 DIAGNOSIS — E782 Mixed hyperlipidemia: Secondary | ICD-10-CM | POA: Diagnosis not present

## 2015-12-22 DIAGNOSIS — H538 Other visual disturbances: Secondary | ICD-10-CM | POA: Diagnosis not present

## 2016-02-13 DIAGNOSIS — S298XXA Other specified injuries of thorax, initial encounter: Secondary | ICD-10-CM | POA: Diagnosis not present

## 2016-02-13 DIAGNOSIS — S098XXA Other specified injuries of head, initial encounter: Secondary | ICD-10-CM | POA: Diagnosis not present

## 2016-02-13 DIAGNOSIS — S1980XA Other specified injuries of unspecified part of neck, initial encounter: Secondary | ICD-10-CM | POA: Diagnosis not present

## 2016-02-13 DIAGNOSIS — S3981XA Other specified injuries of abdomen, initial encounter: Secondary | ICD-10-CM | POA: Diagnosis not present

## 2016-02-13 DIAGNOSIS — S2221XA Fracture of manubrium, initial encounter for closed fracture: Secondary | ICD-10-CM | POA: Diagnosis not present

## 2016-02-17 DIAGNOSIS — E119 Type 2 diabetes mellitus without complications: Secondary | ICD-10-CM | POA: Diagnosis not present

## 2016-02-17 DIAGNOSIS — E782 Mixed hyperlipidemia: Secondary | ICD-10-CM | POA: Diagnosis not present

## 2016-02-17 DIAGNOSIS — D649 Anemia, unspecified: Secondary | ICD-10-CM | POA: Diagnosis not present

## 2016-02-17 DIAGNOSIS — D519 Vitamin B12 deficiency anemia, unspecified: Secondary | ICD-10-CM | POA: Diagnosis not present

## 2016-02-17 DIAGNOSIS — I10 Essential (primary) hypertension: Secondary | ICD-10-CM | POA: Diagnosis not present

## 2016-02-17 DIAGNOSIS — R5383 Other fatigue: Secondary | ICD-10-CM | POA: Diagnosis not present

## 2016-02-23 DIAGNOSIS — K317 Polyp of stomach and duodenum: Secondary | ICD-10-CM | POA: Diagnosis not present

## 2016-02-23 DIAGNOSIS — R5383 Other fatigue: Secondary | ICD-10-CM | POA: Diagnosis not present

## 2016-02-23 DIAGNOSIS — D509 Iron deficiency anemia, unspecified: Secondary | ICD-10-CM | POA: Diagnosis not present

## 2016-02-23 DIAGNOSIS — I10 Essential (primary) hypertension: Secondary | ICD-10-CM | POA: Diagnosis not present

## 2016-02-23 DIAGNOSIS — E119 Type 2 diabetes mellitus without complications: Secondary | ICD-10-CM | POA: Diagnosis not present

## 2016-02-23 DIAGNOSIS — Z6822 Body mass index (BMI) 22.0-22.9, adult: Secondary | ICD-10-CM | POA: Diagnosis not present

## 2016-02-23 DIAGNOSIS — E782 Mixed hyperlipidemia: Secondary | ICD-10-CM | POA: Diagnosis not present

## 2016-02-23 DIAGNOSIS — S2222XA Fracture of body of sternum, initial encounter for closed fracture: Secondary | ICD-10-CM | POA: Diagnosis not present

## 2016-02-29 DIAGNOSIS — Z23 Encounter for immunization: Secondary | ICD-10-CM | POA: Diagnosis not present

## 2016-06-23 DIAGNOSIS — R42 Dizziness and giddiness: Secondary | ICD-10-CM | POA: Diagnosis not present

## 2016-06-23 DIAGNOSIS — E119 Type 2 diabetes mellitus without complications: Secondary | ICD-10-CM | POA: Diagnosis not present

## 2016-06-23 DIAGNOSIS — D649 Anemia, unspecified: Secondary | ICD-10-CM | POA: Diagnosis not present

## 2016-06-23 DIAGNOSIS — I10 Essential (primary) hypertension: Secondary | ICD-10-CM | POA: Diagnosis not present

## 2016-06-23 DIAGNOSIS — Z6821 Body mass index (BMI) 21.0-21.9, adult: Secondary | ICD-10-CM | POA: Diagnosis not present

## 2016-06-29 DIAGNOSIS — R51 Headache: Secondary | ICD-10-CM | POA: Diagnosis not present

## 2016-06-29 DIAGNOSIS — D352 Benign neoplasm of pituitary gland: Secondary | ICD-10-CM | POA: Diagnosis not present

## 2016-06-29 DIAGNOSIS — R42 Dizziness and giddiness: Secondary | ICD-10-CM | POA: Diagnosis not present

## 2016-09-27 DIAGNOSIS — Z6821 Body mass index (BMI) 21.0-21.9, adult: Secondary | ICD-10-CM | POA: Diagnosis not present

## 2016-09-27 DIAGNOSIS — R42 Dizziness and giddiness: Secondary | ICD-10-CM | POA: Diagnosis not present

## 2016-10-07 DIAGNOSIS — R2689 Other abnormalities of gait and mobility: Secondary | ICD-10-CM | POA: Diagnosis not present

## 2016-10-07 DIAGNOSIS — R42 Dizziness and giddiness: Secondary | ICD-10-CM | POA: Diagnosis not present

## 2016-10-10 DIAGNOSIS — R2689 Other abnormalities of gait and mobility: Secondary | ICD-10-CM | POA: Diagnosis not present

## 2016-10-10 DIAGNOSIS — R42 Dizziness and giddiness: Secondary | ICD-10-CM | POA: Diagnosis not present

## 2016-10-11 DIAGNOSIS — I10 Essential (primary) hypertension: Secondary | ICD-10-CM | POA: Diagnosis not present

## 2016-10-11 DIAGNOSIS — D519 Vitamin B12 deficiency anemia, unspecified: Secondary | ICD-10-CM | POA: Diagnosis not present

## 2016-10-11 DIAGNOSIS — E119 Type 2 diabetes mellitus without complications: Secondary | ICD-10-CM | POA: Diagnosis not present

## 2016-10-11 DIAGNOSIS — D529 Folate deficiency anemia, unspecified: Secondary | ICD-10-CM | POA: Diagnosis not present

## 2016-10-11 DIAGNOSIS — D509 Iron deficiency anemia, unspecified: Secondary | ICD-10-CM | POA: Diagnosis not present

## 2016-10-11 DIAGNOSIS — R5383 Other fatigue: Secondary | ICD-10-CM | POA: Diagnosis not present

## 2016-10-11 DIAGNOSIS — E782 Mixed hyperlipidemia: Secondary | ICD-10-CM | POA: Diagnosis not present

## 2016-10-14 DIAGNOSIS — R2689 Other abnormalities of gait and mobility: Secondary | ICD-10-CM | POA: Diagnosis not present

## 2016-10-14 DIAGNOSIS — R42 Dizziness and giddiness: Secondary | ICD-10-CM | POA: Diagnosis not present

## 2016-10-17 DIAGNOSIS — E782 Mixed hyperlipidemia: Secondary | ICD-10-CM | POA: Diagnosis not present

## 2016-10-17 DIAGNOSIS — E119 Type 2 diabetes mellitus without complications: Secondary | ICD-10-CM | POA: Diagnosis not present

## 2016-10-17 DIAGNOSIS — Z6820 Body mass index (BMI) 20.0-20.9, adult: Secondary | ICD-10-CM | POA: Diagnosis not present

## 2016-10-17 DIAGNOSIS — R42 Dizziness and giddiness: Secondary | ICD-10-CM | POA: Diagnosis not present

## 2016-10-17 DIAGNOSIS — D352 Benign neoplasm of pituitary gland: Secondary | ICD-10-CM | POA: Diagnosis not present

## 2016-10-17 DIAGNOSIS — E559 Vitamin D deficiency, unspecified: Secondary | ICD-10-CM | POA: Diagnosis not present

## 2016-10-17 DIAGNOSIS — R2681 Unsteadiness on feet: Secondary | ICD-10-CM | POA: Diagnosis not present

## 2016-10-17 DIAGNOSIS — D509 Iron deficiency anemia, unspecified: Secondary | ICD-10-CM | POA: Diagnosis not present

## 2016-10-17 DIAGNOSIS — K317 Polyp of stomach and duodenum: Secondary | ICD-10-CM | POA: Diagnosis not present

## 2016-10-17 DIAGNOSIS — I10 Essential (primary) hypertension: Secondary | ICD-10-CM | POA: Diagnosis not present

## 2016-10-17 DIAGNOSIS — H6121 Impacted cerumen, right ear: Secondary | ICD-10-CM | POA: Diagnosis not present

## 2016-10-18 DIAGNOSIS — H903 Sensorineural hearing loss, bilateral: Secondary | ICD-10-CM | POA: Diagnosis not present

## 2016-10-18 DIAGNOSIS — H8112 Benign paroxysmal vertigo, left ear: Secondary | ICD-10-CM | POA: Diagnosis not present

## 2016-10-19 DIAGNOSIS — R2689 Other abnormalities of gait and mobility: Secondary | ICD-10-CM | POA: Diagnosis not present

## 2016-10-19 DIAGNOSIS — R42 Dizziness and giddiness: Secondary | ICD-10-CM | POA: Diagnosis not present

## 2016-10-21 DIAGNOSIS — R42 Dizziness and giddiness: Secondary | ICD-10-CM | POA: Diagnosis not present

## 2016-10-21 DIAGNOSIS — R2689 Other abnormalities of gait and mobility: Secondary | ICD-10-CM | POA: Diagnosis not present

## 2016-10-24 DIAGNOSIS — R42 Dizziness and giddiness: Secondary | ICD-10-CM | POA: Diagnosis not present

## 2016-10-24 DIAGNOSIS — R2689 Other abnormalities of gait and mobility: Secondary | ICD-10-CM | POA: Diagnosis not present

## 2016-10-26 DIAGNOSIS — R2689 Other abnormalities of gait and mobility: Secondary | ICD-10-CM | POA: Diagnosis not present

## 2016-10-26 DIAGNOSIS — R42 Dizziness and giddiness: Secondary | ICD-10-CM | POA: Diagnosis not present

## 2016-10-31 DIAGNOSIS — R2689 Other abnormalities of gait and mobility: Secondary | ICD-10-CM | POA: Diagnosis not present

## 2016-10-31 DIAGNOSIS — R42 Dizziness and giddiness: Secondary | ICD-10-CM | POA: Diagnosis not present

## 2016-11-02 DIAGNOSIS — R2689 Other abnormalities of gait and mobility: Secondary | ICD-10-CM | POA: Diagnosis not present

## 2016-11-02 DIAGNOSIS — R42 Dizziness and giddiness: Secondary | ICD-10-CM | POA: Diagnosis not present

## 2016-11-07 DIAGNOSIS — R2689 Other abnormalities of gait and mobility: Secondary | ICD-10-CM | POA: Diagnosis not present

## 2016-11-07 DIAGNOSIS — R42 Dizziness and giddiness: Secondary | ICD-10-CM | POA: Diagnosis not present

## 2016-11-14 DIAGNOSIS — R42 Dizziness and giddiness: Secondary | ICD-10-CM | POA: Diagnosis not present

## 2016-11-14 DIAGNOSIS — R2689 Other abnormalities of gait and mobility: Secondary | ICD-10-CM | POA: Diagnosis not present

## 2016-11-18 DIAGNOSIS — R2689 Other abnormalities of gait and mobility: Secondary | ICD-10-CM | POA: Diagnosis not present

## 2016-11-18 DIAGNOSIS — R42 Dizziness and giddiness: Secondary | ICD-10-CM | POA: Diagnosis not present

## 2016-12-01 DIAGNOSIS — L821 Other seborrheic keratosis: Secondary | ICD-10-CM | POA: Diagnosis not present

## 2017-03-09 DIAGNOSIS — Z23 Encounter for immunization: Secondary | ICD-10-CM | POA: Diagnosis not present

## 2017-04-05 DIAGNOSIS — E119 Type 2 diabetes mellitus without complications: Secondary | ICD-10-CM | POA: Diagnosis not present

## 2017-04-05 DIAGNOSIS — E782 Mixed hyperlipidemia: Secondary | ICD-10-CM | POA: Diagnosis not present

## 2017-04-05 DIAGNOSIS — D509 Iron deficiency anemia, unspecified: Secondary | ICD-10-CM | POA: Diagnosis not present

## 2017-04-05 DIAGNOSIS — D352 Benign neoplasm of pituitary gland: Secondary | ICD-10-CM | POA: Diagnosis not present

## 2017-04-05 DIAGNOSIS — E559 Vitamin D deficiency, unspecified: Secondary | ICD-10-CM | POA: Diagnosis not present

## 2017-04-05 DIAGNOSIS — I10 Essential (primary) hypertension: Secondary | ICD-10-CM | POA: Diagnosis not present

## 2017-04-05 DIAGNOSIS — D519 Vitamin B12 deficiency anemia, unspecified: Secondary | ICD-10-CM | POA: Diagnosis not present

## 2017-04-11 DIAGNOSIS — R102 Pelvic and perineal pain: Secondary | ICD-10-CM | POA: Diagnosis not present

## 2017-04-11 DIAGNOSIS — Z6821 Body mass index (BMI) 21.0-21.9, adult: Secondary | ICD-10-CM | POA: Diagnosis not present

## 2017-04-11 DIAGNOSIS — E782 Mixed hyperlipidemia: Secondary | ICD-10-CM | POA: Diagnosis not present

## 2017-04-11 DIAGNOSIS — I10 Essential (primary) hypertension: Secondary | ICD-10-CM | POA: Diagnosis not present

## 2017-04-11 DIAGNOSIS — R42 Dizziness and giddiness: Secondary | ICD-10-CM | POA: Diagnosis not present

## 2017-04-11 DIAGNOSIS — R2681 Unsteadiness on feet: Secondary | ICD-10-CM | POA: Diagnosis not present

## 2017-04-11 DIAGNOSIS — Z0001 Encounter for general adult medical examination with abnormal findings: Secondary | ICD-10-CM | POA: Diagnosis not present

## 2017-04-11 DIAGNOSIS — R944 Abnormal results of kidney function studies: Secondary | ICD-10-CM | POA: Diagnosis not present

## 2017-04-11 DIAGNOSIS — E119 Type 2 diabetes mellitus without complications: Secondary | ICD-10-CM | POA: Diagnosis not present

## 2017-04-11 DIAGNOSIS — D509 Iron deficiency anemia, unspecified: Secondary | ICD-10-CM | POA: Diagnosis not present

## 2017-04-11 DIAGNOSIS — R350 Frequency of micturition: Secondary | ICD-10-CM | POA: Diagnosis not present

## 2017-04-17 DIAGNOSIS — M47816 Spondylosis without myelopathy or radiculopathy, lumbar region: Secondary | ICD-10-CM | POA: Diagnosis not present

## 2017-04-17 DIAGNOSIS — M47812 Spondylosis without myelopathy or radiculopathy, cervical region: Secondary | ICD-10-CM | POA: Diagnosis not present

## 2017-04-17 DIAGNOSIS — M9903 Segmental and somatic dysfunction of lumbar region: Secondary | ICD-10-CM | POA: Diagnosis not present

## 2017-04-17 DIAGNOSIS — M9902 Segmental and somatic dysfunction of thoracic region: Secondary | ICD-10-CM | POA: Diagnosis not present

## 2017-04-17 DIAGNOSIS — M4125 Other idiopathic scoliosis, thoracolumbar region: Secondary | ICD-10-CM | POA: Diagnosis not present

## 2017-04-17 DIAGNOSIS — M9901 Segmental and somatic dysfunction of cervical region: Secondary | ICD-10-CM | POA: Diagnosis not present

## 2017-04-17 DIAGNOSIS — M4004 Postural kyphosis, thoracic region: Secondary | ICD-10-CM | POA: Diagnosis not present

## 2017-04-18 DIAGNOSIS — D259 Leiomyoma of uterus, unspecified: Secondary | ICD-10-CM | POA: Diagnosis not present

## 2017-04-18 DIAGNOSIS — R102 Pelvic and perineal pain: Secondary | ICD-10-CM | POA: Diagnosis not present

## 2017-04-19 DIAGNOSIS — M9903 Segmental and somatic dysfunction of lumbar region: Secondary | ICD-10-CM | POA: Diagnosis not present

## 2017-04-19 DIAGNOSIS — M4125 Other idiopathic scoliosis, thoracolumbar region: Secondary | ICD-10-CM | POA: Diagnosis not present

## 2017-04-19 DIAGNOSIS — M9901 Segmental and somatic dysfunction of cervical region: Secondary | ICD-10-CM | POA: Diagnosis not present

## 2017-04-19 DIAGNOSIS — M47816 Spondylosis without myelopathy or radiculopathy, lumbar region: Secondary | ICD-10-CM | POA: Diagnosis not present

## 2017-04-19 DIAGNOSIS — M47812 Spondylosis without myelopathy or radiculopathy, cervical region: Secondary | ICD-10-CM | POA: Diagnosis not present

## 2017-04-19 DIAGNOSIS — M9902 Segmental and somatic dysfunction of thoracic region: Secondary | ICD-10-CM | POA: Diagnosis not present

## 2017-04-19 DIAGNOSIS — M4004 Postural kyphosis, thoracic region: Secondary | ICD-10-CM | POA: Diagnosis not present

## 2017-04-20 DIAGNOSIS — M4186 Other forms of scoliosis, lumbar region: Secondary | ICD-10-CM | POA: Diagnosis not present

## 2017-04-20 DIAGNOSIS — M419 Scoliosis, unspecified: Secondary | ICD-10-CM | POA: Diagnosis not present

## 2017-04-24 DIAGNOSIS — M9901 Segmental and somatic dysfunction of cervical region: Secondary | ICD-10-CM | POA: Diagnosis not present

## 2017-04-24 DIAGNOSIS — M4004 Postural kyphosis, thoracic region: Secondary | ICD-10-CM | POA: Diagnosis not present

## 2017-04-24 DIAGNOSIS — M9902 Segmental and somatic dysfunction of thoracic region: Secondary | ICD-10-CM | POA: Diagnosis not present

## 2017-04-24 DIAGNOSIS — M4125 Other idiopathic scoliosis, thoracolumbar region: Secondary | ICD-10-CM | POA: Diagnosis not present

## 2017-04-24 DIAGNOSIS — M9903 Segmental and somatic dysfunction of lumbar region: Secondary | ICD-10-CM | POA: Diagnosis not present

## 2017-04-24 DIAGNOSIS — M47812 Spondylosis without myelopathy or radiculopathy, cervical region: Secondary | ICD-10-CM | POA: Diagnosis not present

## 2017-04-24 DIAGNOSIS — M47816 Spondylosis without myelopathy or radiculopathy, lumbar region: Secondary | ICD-10-CM | POA: Diagnosis not present

## 2017-04-25 DIAGNOSIS — E559 Vitamin D deficiency, unspecified: Secondary | ICD-10-CM | POA: Diagnosis not present

## 2017-04-25 DIAGNOSIS — E782 Mixed hyperlipidemia: Secondary | ICD-10-CM | POA: Diagnosis not present

## 2017-04-25 DIAGNOSIS — D509 Iron deficiency anemia, unspecified: Secondary | ICD-10-CM | POA: Diagnosis not present

## 2017-04-25 DIAGNOSIS — R5383 Other fatigue: Secondary | ICD-10-CM | POA: Diagnosis not present

## 2017-04-25 DIAGNOSIS — R42 Dizziness and giddiness: Secondary | ICD-10-CM | POA: Diagnosis not present

## 2017-04-25 DIAGNOSIS — E119 Type 2 diabetes mellitus without complications: Secondary | ICD-10-CM | POA: Diagnosis not present

## 2017-04-25 DIAGNOSIS — I10 Essential (primary) hypertension: Secondary | ICD-10-CM | POA: Diagnosis not present

## 2017-04-27 DIAGNOSIS — M9903 Segmental and somatic dysfunction of lumbar region: Secondary | ICD-10-CM | POA: Diagnosis not present

## 2017-04-27 DIAGNOSIS — M4125 Other idiopathic scoliosis, thoracolumbar region: Secondary | ICD-10-CM | POA: Diagnosis not present

## 2017-04-27 DIAGNOSIS — M4004 Postural kyphosis, thoracic region: Secondary | ICD-10-CM | POA: Diagnosis not present

## 2017-04-27 DIAGNOSIS — M9901 Segmental and somatic dysfunction of cervical region: Secondary | ICD-10-CM | POA: Diagnosis not present

## 2017-04-27 DIAGNOSIS — M47812 Spondylosis without myelopathy or radiculopathy, cervical region: Secondary | ICD-10-CM | POA: Diagnosis not present

## 2017-04-27 DIAGNOSIS — M9902 Segmental and somatic dysfunction of thoracic region: Secondary | ICD-10-CM | POA: Diagnosis not present

## 2017-04-27 DIAGNOSIS — M47816 Spondylosis without myelopathy or radiculopathy, lumbar region: Secondary | ICD-10-CM | POA: Diagnosis not present

## 2017-05-01 DIAGNOSIS — M9902 Segmental and somatic dysfunction of thoracic region: Secondary | ICD-10-CM | POA: Diagnosis not present

## 2017-05-01 DIAGNOSIS — M47816 Spondylosis without myelopathy or radiculopathy, lumbar region: Secondary | ICD-10-CM | POA: Diagnosis not present

## 2017-05-01 DIAGNOSIS — M47812 Spondylosis without myelopathy or radiculopathy, cervical region: Secondary | ICD-10-CM | POA: Diagnosis not present

## 2017-05-01 DIAGNOSIS — M9903 Segmental and somatic dysfunction of lumbar region: Secondary | ICD-10-CM | POA: Diagnosis not present

## 2017-05-01 DIAGNOSIS — M9901 Segmental and somatic dysfunction of cervical region: Secondary | ICD-10-CM | POA: Diagnosis not present

## 2017-05-01 DIAGNOSIS — M4125 Other idiopathic scoliosis, thoracolumbar region: Secondary | ICD-10-CM | POA: Diagnosis not present

## 2017-05-01 DIAGNOSIS — M4004 Postural kyphosis, thoracic region: Secondary | ICD-10-CM | POA: Diagnosis not present

## 2017-05-04 DIAGNOSIS — M47812 Spondylosis without myelopathy or radiculopathy, cervical region: Secondary | ICD-10-CM | POA: Diagnosis not present

## 2017-05-04 DIAGNOSIS — M9901 Segmental and somatic dysfunction of cervical region: Secondary | ICD-10-CM | POA: Diagnosis not present

## 2017-05-04 DIAGNOSIS — M9902 Segmental and somatic dysfunction of thoracic region: Secondary | ICD-10-CM | POA: Diagnosis not present

## 2017-05-04 DIAGNOSIS — M4004 Postural kyphosis, thoracic region: Secondary | ICD-10-CM | POA: Diagnosis not present

## 2017-05-04 DIAGNOSIS — M4125 Other idiopathic scoliosis, thoracolumbar region: Secondary | ICD-10-CM | POA: Diagnosis not present

## 2017-05-04 DIAGNOSIS — M47816 Spondylosis without myelopathy or radiculopathy, lumbar region: Secondary | ICD-10-CM | POA: Diagnosis not present

## 2017-05-04 DIAGNOSIS — M9903 Segmental and somatic dysfunction of lumbar region: Secondary | ICD-10-CM | POA: Diagnosis not present

## 2017-05-08 DIAGNOSIS — M47816 Spondylosis without myelopathy or radiculopathy, lumbar region: Secondary | ICD-10-CM | POA: Diagnosis not present

## 2017-05-08 DIAGNOSIS — M9901 Segmental and somatic dysfunction of cervical region: Secondary | ICD-10-CM | POA: Diagnosis not present

## 2017-05-08 DIAGNOSIS — M4125 Other idiopathic scoliosis, thoracolumbar region: Secondary | ICD-10-CM | POA: Diagnosis not present

## 2017-05-08 DIAGNOSIS — M9902 Segmental and somatic dysfunction of thoracic region: Secondary | ICD-10-CM | POA: Diagnosis not present

## 2017-05-08 DIAGNOSIS — M9903 Segmental and somatic dysfunction of lumbar region: Secondary | ICD-10-CM | POA: Diagnosis not present

## 2017-05-08 DIAGNOSIS — M4004 Postural kyphosis, thoracic region: Secondary | ICD-10-CM | POA: Diagnosis not present

## 2017-05-08 DIAGNOSIS — M47812 Spondylosis without myelopathy or radiculopathy, cervical region: Secondary | ICD-10-CM | POA: Diagnosis not present

## 2017-05-10 DIAGNOSIS — M4125 Other idiopathic scoliosis, thoracolumbar region: Secondary | ICD-10-CM | POA: Diagnosis not present

## 2017-05-10 DIAGNOSIS — M4004 Postural kyphosis, thoracic region: Secondary | ICD-10-CM | POA: Diagnosis not present

## 2017-05-10 DIAGNOSIS — M9903 Segmental and somatic dysfunction of lumbar region: Secondary | ICD-10-CM | POA: Diagnosis not present

## 2017-05-10 DIAGNOSIS — M9902 Segmental and somatic dysfunction of thoracic region: Secondary | ICD-10-CM | POA: Diagnosis not present

## 2017-05-10 DIAGNOSIS — M47812 Spondylosis without myelopathy or radiculopathy, cervical region: Secondary | ICD-10-CM | POA: Diagnosis not present

## 2017-05-10 DIAGNOSIS — M47816 Spondylosis without myelopathy or radiculopathy, lumbar region: Secondary | ICD-10-CM | POA: Diagnosis not present

## 2017-05-10 DIAGNOSIS — M9901 Segmental and somatic dysfunction of cervical region: Secondary | ICD-10-CM | POA: Diagnosis not present

## 2017-05-16 DIAGNOSIS — M4004 Postural kyphosis, thoracic region: Secondary | ICD-10-CM | POA: Diagnosis not present

## 2017-05-16 DIAGNOSIS — M9903 Segmental and somatic dysfunction of lumbar region: Secondary | ICD-10-CM | POA: Diagnosis not present

## 2017-05-16 DIAGNOSIS — M9902 Segmental and somatic dysfunction of thoracic region: Secondary | ICD-10-CM | POA: Diagnosis not present

## 2017-05-16 DIAGNOSIS — M47816 Spondylosis without myelopathy or radiculopathy, lumbar region: Secondary | ICD-10-CM | POA: Diagnosis not present

## 2017-05-16 DIAGNOSIS — M47812 Spondylosis without myelopathy or radiculopathy, cervical region: Secondary | ICD-10-CM | POA: Diagnosis not present

## 2017-05-16 DIAGNOSIS — M4125 Other idiopathic scoliosis, thoracolumbar region: Secondary | ICD-10-CM | POA: Diagnosis not present

## 2017-05-16 DIAGNOSIS — M9901 Segmental and somatic dysfunction of cervical region: Secondary | ICD-10-CM | POA: Diagnosis not present

## 2017-05-18 DIAGNOSIS — M9902 Segmental and somatic dysfunction of thoracic region: Secondary | ICD-10-CM | POA: Diagnosis not present

## 2017-05-18 DIAGNOSIS — M9903 Segmental and somatic dysfunction of lumbar region: Secondary | ICD-10-CM | POA: Diagnosis not present

## 2017-05-18 DIAGNOSIS — M47812 Spondylosis without myelopathy or radiculopathy, cervical region: Secondary | ICD-10-CM | POA: Diagnosis not present

## 2017-05-18 DIAGNOSIS — M4004 Postural kyphosis, thoracic region: Secondary | ICD-10-CM | POA: Diagnosis not present

## 2017-05-18 DIAGNOSIS — M4125 Other idiopathic scoliosis, thoracolumbar region: Secondary | ICD-10-CM | POA: Diagnosis not present

## 2017-05-18 DIAGNOSIS — M9901 Segmental and somatic dysfunction of cervical region: Secondary | ICD-10-CM | POA: Diagnosis not present

## 2017-05-18 DIAGNOSIS — M47816 Spondylosis without myelopathy or radiculopathy, lumbar region: Secondary | ICD-10-CM | POA: Diagnosis not present

## 2017-05-22 DIAGNOSIS — M4125 Other idiopathic scoliosis, thoracolumbar region: Secondary | ICD-10-CM | POA: Diagnosis not present

## 2017-05-22 DIAGNOSIS — M9901 Segmental and somatic dysfunction of cervical region: Secondary | ICD-10-CM | POA: Diagnosis not present

## 2017-05-22 DIAGNOSIS — M4004 Postural kyphosis, thoracic region: Secondary | ICD-10-CM | POA: Diagnosis not present

## 2017-05-22 DIAGNOSIS — M9902 Segmental and somatic dysfunction of thoracic region: Secondary | ICD-10-CM | POA: Diagnosis not present

## 2017-05-22 DIAGNOSIS — M47812 Spondylosis without myelopathy or radiculopathy, cervical region: Secondary | ICD-10-CM | POA: Diagnosis not present

## 2017-05-22 DIAGNOSIS — M47816 Spondylosis without myelopathy or radiculopathy, lumbar region: Secondary | ICD-10-CM | POA: Diagnosis not present

## 2017-05-22 DIAGNOSIS — M9903 Segmental and somatic dysfunction of lumbar region: Secondary | ICD-10-CM | POA: Diagnosis not present

## 2017-07-05 DIAGNOSIS — E782 Mixed hyperlipidemia: Secondary | ICD-10-CM | POA: Diagnosis not present

## 2017-07-05 DIAGNOSIS — R944 Abnormal results of kidney function studies: Secondary | ICD-10-CM | POA: Diagnosis not present

## 2017-07-05 DIAGNOSIS — E119 Type 2 diabetes mellitus without complications: Secondary | ICD-10-CM | POA: Diagnosis not present

## 2017-07-05 DIAGNOSIS — I10 Essential (primary) hypertension: Secondary | ICD-10-CM | POA: Diagnosis not present

## 2017-07-12 DIAGNOSIS — R350 Frequency of micturition: Secondary | ICD-10-CM | POA: Diagnosis not present

## 2017-07-12 DIAGNOSIS — D509 Iron deficiency anemia, unspecified: Secondary | ICD-10-CM | POA: Diagnosis not present

## 2017-07-12 DIAGNOSIS — N3281 Overactive bladder: Secondary | ICD-10-CM | POA: Diagnosis not present

## 2017-07-12 DIAGNOSIS — E119 Type 2 diabetes mellitus without complications: Secondary | ICD-10-CM | POA: Diagnosis not present

## 2017-07-12 DIAGNOSIS — I10 Essential (primary) hypertension: Secondary | ICD-10-CM | POA: Diagnosis not present

## 2017-07-12 DIAGNOSIS — E782 Mixed hyperlipidemia: Secondary | ICD-10-CM | POA: Diagnosis not present

## 2017-07-12 DIAGNOSIS — R5383 Other fatigue: Secondary | ICD-10-CM | POA: Diagnosis not present

## 2017-07-12 DIAGNOSIS — Z6821 Body mass index (BMI) 21.0-21.9, adult: Secondary | ICD-10-CM | POA: Diagnosis not present

## 2017-08-10 DIAGNOSIS — M810 Age-related osteoporosis without current pathological fracture: Secondary | ICD-10-CM | POA: Diagnosis not present

## 2017-10-04 DIAGNOSIS — Z1389 Encounter for screening for other disorder: Secondary | ICD-10-CM | POA: Diagnosis not present

## 2017-10-04 DIAGNOSIS — Z1331 Encounter for screening for depression: Secondary | ICD-10-CM | POA: Diagnosis not present

## 2017-10-04 DIAGNOSIS — N3281 Overactive bladder: Secondary | ICD-10-CM | POA: Diagnosis not present

## 2017-10-04 DIAGNOSIS — R42 Dizziness and giddiness: Secondary | ICD-10-CM | POA: Diagnosis not present

## 2017-10-04 DIAGNOSIS — R350 Frequency of micturition: Secondary | ICD-10-CM | POA: Diagnosis not present

## 2017-10-04 DIAGNOSIS — E782 Mixed hyperlipidemia: Secondary | ICD-10-CM | POA: Diagnosis not present

## 2017-10-04 DIAGNOSIS — R0600 Dyspnea, unspecified: Secondary | ICD-10-CM | POA: Diagnosis not present

## 2017-10-04 DIAGNOSIS — K317 Polyp of stomach and duodenum: Secondary | ICD-10-CM | POA: Diagnosis not present

## 2017-10-04 DIAGNOSIS — R2681 Unsteadiness on feet: Secondary | ICD-10-CM | POA: Diagnosis not present

## 2017-10-04 DIAGNOSIS — E559 Vitamin D deficiency, unspecified: Secondary | ICD-10-CM | POA: Diagnosis not present

## 2017-10-04 DIAGNOSIS — D352 Benign neoplasm of pituitary gland: Secondary | ICD-10-CM | POA: Diagnosis not present

## 2017-10-04 DIAGNOSIS — D509 Iron deficiency anemia, unspecified: Secondary | ICD-10-CM | POA: Diagnosis not present

## 2017-10-04 DIAGNOSIS — E119 Type 2 diabetes mellitus without complications: Secondary | ICD-10-CM | POA: Diagnosis not present

## 2017-10-04 DIAGNOSIS — I10 Essential (primary) hypertension: Secondary | ICD-10-CM | POA: Diagnosis not present

## 2017-10-09 DIAGNOSIS — I5189 Other ill-defined heart diseases: Secondary | ICD-10-CM | POA: Diagnosis not present

## 2017-10-09 DIAGNOSIS — I517 Cardiomegaly: Secondary | ICD-10-CM | POA: Diagnosis not present

## 2017-10-09 DIAGNOSIS — I340 Nonrheumatic mitral (valve) insufficiency: Secondary | ICD-10-CM | POA: Diagnosis not present

## 2017-10-09 DIAGNOSIS — R0602 Shortness of breath: Secondary | ICD-10-CM | POA: Diagnosis not present

## 2017-10-09 DIAGNOSIS — D649 Anemia, unspecified: Secondary | ICD-10-CM | POA: Diagnosis not present

## 2017-10-09 DIAGNOSIS — I351 Nonrheumatic aortic (valve) insufficiency: Secondary | ICD-10-CM | POA: Diagnosis not present

## 2017-10-16 DIAGNOSIS — D649 Anemia, unspecified: Secondary | ICD-10-CM | POA: Diagnosis not present

## 2017-11-09 DIAGNOSIS — R42 Dizziness and giddiness: Secondary | ICD-10-CM | POA: Diagnosis not present

## 2017-11-09 DIAGNOSIS — Z6821 Body mass index (BMI) 21.0-21.9, adult: Secondary | ICD-10-CM | POA: Diagnosis not present

## 2017-11-09 DIAGNOSIS — I10 Essential (primary) hypertension: Secondary | ICD-10-CM | POA: Diagnosis not present

## 2017-11-09 DIAGNOSIS — K317 Polyp of stomach and duodenum: Secondary | ICD-10-CM | POA: Diagnosis not present

## 2017-11-09 DIAGNOSIS — E119 Type 2 diabetes mellitus without complications: Secondary | ICD-10-CM | POA: Diagnosis not present

## 2017-11-09 DIAGNOSIS — E782 Mixed hyperlipidemia: Secondary | ICD-10-CM | POA: Diagnosis not present

## 2017-11-09 DIAGNOSIS — D509 Iron deficiency anemia, unspecified: Secondary | ICD-10-CM | POA: Diagnosis not present

## 2017-11-09 DIAGNOSIS — M810 Age-related osteoporosis without current pathological fracture: Secondary | ICD-10-CM | POA: Diagnosis not present

## 2017-11-29 DIAGNOSIS — D500 Iron deficiency anemia secondary to blood loss (chronic): Secondary | ICD-10-CM | POA: Diagnosis not present

## 2017-11-30 ENCOUNTER — Ambulatory Visit (INDEPENDENT_AMBULATORY_CARE_PROVIDER_SITE_OTHER): Payer: Medicare Other | Admitting: Cardiovascular Disease

## 2017-11-30 ENCOUNTER — Encounter: Payer: Self-pay | Admitting: Cardiovascular Disease

## 2017-11-30 ENCOUNTER — Encounter

## 2017-11-30 VITALS — BP 130/68 | HR 81 | Ht 62.0 in | Wt 125.0 lb

## 2017-11-30 DIAGNOSIS — E785 Hyperlipidemia, unspecified: Secondary | ICD-10-CM

## 2017-11-30 DIAGNOSIS — I10 Essential (primary) hypertension: Secondary | ICD-10-CM | POA: Diagnosis not present

## 2017-11-30 DIAGNOSIS — D509 Iron deficiency anemia, unspecified: Secondary | ICD-10-CM

## 2017-11-30 DIAGNOSIS — R0609 Other forms of dyspnea: Secondary | ICD-10-CM

## 2017-11-30 DIAGNOSIS — Z955 Presence of coronary angioplasty implant and graft: Secondary | ICD-10-CM

## 2017-11-30 DIAGNOSIS — I25118 Atherosclerotic heart disease of native coronary artery with other forms of angina pectoris: Secondary | ICD-10-CM | POA: Diagnosis not present

## 2017-11-30 MED ORDER — METOPROLOL TARTRATE 25 MG PO TABS: 25.0000 mg | ORAL_TABLET | Freq: Two times a day (BID) | ORAL | 3 refills | Status: DC

## 2017-11-30 MED ORDER — ISOSORBIDE DINITRATE 30 MG PO TABS: 30.0000 mg | ORAL_TABLET | Freq: Two times a day (BID) | ORAL | 11 refills | Status: DC

## 2017-11-30 NOTE — Patient Instructions (Signed)
Your physician recommends that you schedule a follow-up appointment in:  3 months with Dr.Koneswaran    INCREASE Isosorbide to twice a day    START lopressor 25 mg twice a day     No lab work or tests ordered today.    If you need a refill on your cardiac medications before your next appointment, please call your pharmacy.      Thank you for choosing [facility] !

## 2017-11-30 NOTE — Progress Notes (Signed)
SUBJECTIVE: The patient presents for past due follow-up.  I last evaluated her in August 2016.  She has a history of LAD stent placement.    She underwent right and left heart catheterization and coronary angiography in July 2016. This demonstrated normal right heart pressures, cardiac output , and cardiac index. The LAD stent was patent.   She also has a large hiatal hernia.  She had mild restrictive disease by pulmonary function testing in August 2016.  I reviewed records from her PCP.  ECG was unremarkable and appears to be informed in April 2019.  I reviewed labs dated 10/04/2017: Hemoglobin 10, platelets 227, A1c 6.4%, TSH 0.7, BUN 19, creatinine 0.68, sodium 145, potassium 4.2.  An echocardiogram performed at [facility] on 10/09/2017 demonstrated normal left ventricular systolic function, LVEF 55 to 60%, moderate concentric LVH, grade 2 diastolic dysfunction with elevated left ventricular end-diastolic pressures, mild aortic, mitral, and tricuspid regurgitation.  There was basal septal hypertrophy and systolic anterior motion of the mitral valve creating an outflow tract gradient which is severe with peak velocity greater than 5 m/s and peak gradient greater than 125 mill meters mercury with Valsalva.  Upon review of PCP notes from April 2019, it appears she has chronic shortness of breath primarily with exertion.  She tells me she feels short of breath and fatigue.  She is due to be receiving IV iron.  She has not been on Toprol-XL for years.  She does take aspirin 81 mg and isosorbide dinitrate 30 mg daily.    Review of Systems: As per "subjective", otherwise negative.  Allergies  Allergen Reactions  . Brilinta [Ticagrelor] Nausea And Vomiting and Other (See Comments)    dyspnea  . Iodine Nausea And Vomiting  . Shellfish Allergy Nausea And Vomiting and Other (See Comments)    Unsteady gait   . Streptomycin Nausea And Vomiting  . Iodinated Diagnostic Agents Nausea Only     Current Outpatient Medications  Medication Sig Dispense Refill  . Ascorbic Acid (VITAMIN C) 500 MG CAPS Take 500 mg by mouth daily.     Marland Kitchen atorvastatin (LIPITOR) 10 MG tablet Take 10 mg by mouth daily at 6 PM.   3  . Biotin 10000 MCG TABS Take 10,000 mcg by mouth.    . isosorbide dinitrate (ISORDIL) 30 MG tablet Take 1 tablet (30 mg total) by mouth daily. 30 tablet 5  . nitroGLYCERIN (NITROSTAT) 0.4 MG SL tablet Place 1 tablet (0.4 mg total) under the tongue every 5 (five) minutes as needed for chest pain. (Patient not taking: Reported on 11/30/2017) 25 tablet 3   No current facility-administered medications for this visit.     Past Medical History:  Diagnosis Date  . Anemia   . Blood transfusion   . Cancer (HCC)    Skin  . Cardiogenic shock ([place])    a. 05/2011 requiring pressor therapy.  . Carotid artery disease ([place])    a. 3-29% RICA, 92-42% LICA 12/8339.  Marland Kitchen Coronary artery disease    a. NSTEMI 05/2011 s/p DES to LAD, with ?Takotsubo distribution of cardiomyopathy at that time with cardiogenic shock. b. NSTEMI 06/2011 ?spasm - stable CAD, normal EF. c. Patent LAD stent, nonobst CAD by cath 08/20/12 with normal EF. d. Hx dyspnea with Brilinta.  Marland Kitchen GERD (gastroesophageal reflux disease)   . Hyperlipidemia   . Hypertension   . Hypertrophic cardiomyopathy ([place])    a. echo 08/2012: focal basal septal hypertrophy, mildly elevated gradient 70mmHg across  LVOT.  . Iron deficiency    a. Hx iron infusion.  . Mitral regurgitation   . Nasal polyp   . Pneumonia   . Sleep apnea   . Stress-induced cardiomyopathy 05/2011   a. ?Takotubso cardiomyopathy - distribution was different than that of LAD stenosis.    Past Surgical History:  Procedure Laterality Date  . ABDOMINAL HYSTERECTOMY    . APPENDECTOMY    . CARDIAC CATHETERIZATION  05/2011   80% mid LAD, 40% LCX. EF: 35-40%.   . CARDIAC CATHETERIZATION  06/22/2011   patent LAD stent. Normal EF  . CARDIAC CATHETERIZATION  08/2012    Patent LAD stent with normal ejection fraction.  Marland Kitchen CARDIAC CATHETERIZATION N/A 01/19/2015   Procedure: Right/Left Heart Cath and Coronary Angiography;  Surgeon: Jettie Booze, MD;  Location: [facility];  Service: Cardiovascular;  Laterality: N/A;  . CORONARY ANGIOPLASTY WITH STENT PLACEMENT  05/2011   LAD: 2.5 X 16 mm Promus DES  . ESOPHAGOGASTRODUODENOSCOPY N/A 05/23/2014   Procedure: ESOPHAGOGASTRODUODENOSCOPY (EGD);  Surgeon: Rogene Houston, MD;  [facility];  Service: Endoscopy;  Laterality: N/A;  230 - moved to 1:05 - Ann notified pt  . ESOPHAGOGASTRODUODENOSCOPY N/A 05/20/2015   Procedure: ESOPHAGOGASTRODUODENOSCOPY (EGD);  Surgeon: Rogene Houston, MD;  [facility];  Service: Endoscopy;  Laterality: N/A;  1:00  . ESOPHAGOGASTRODUODENOSCOPY N/A 05/28/2015   Procedure: ESOPHAGOGASTRODUODENOSCOPY (EGD);  Surgeon: Rogene Houston, MD;  [facility];  Service: Endoscopy;  Laterality: N/A;  . GIVENS CAPSULE STUDY N/A 05/12/2014   Procedure: GIVENS CAPSULE STUDY;  Surgeon: Rogene Houston, MD;  [facility];  Service: Endoscopy;  Laterality: N/A;  730  . LEFT HEART CATHETERIZATION WITH CORONARY ANGIOGRAM N/A 05/12/2011   Procedure: LEFT HEART CATHETERIZATION WITH CORONARY ANGIOGRAM;  Surgeon: Troy Sine, MD;  Location: [facility] CATH LAB;  Service: Cardiovascular;  Laterality: N/A;  . LEFT HEART CATHETERIZATION WITH CORONARY ANGIOGRAM N/A 06/22/2011   Procedure: LEFT HEART CATHETERIZATION WITH CORONARY ANGIOGRAM;  Surgeon: Wellington Hampshire, MD;  Location: [facility]ATH LAB;  Service: Cardiovascular;  Laterality: N/A;  . LEFT HEART CATHETERIZATION WITH CORONARY ANGIOGRAM N/A 08/20/2012   Procedure: LEFT HEART CATHETERIZATION WITH CORONARY ANGIOGRAM;  Surgeon: Burnell Blanks, MD;  Location: [facility] CATH LAB;  Service: Cardiovascular;  Laterality: N/A;  . PARTIAL HYSTERECTOMY    . PERCUTANEOUS CORONARY STENT INTERVENTION (PCI-S)  05/12/2011    Procedure: PERCUTANEOUS CORONARY STENT INTERVENTION (PCI-S);  Surgeon: Troy Sine, MD;  Location: [facility] CATH LAB;  Service: Cardiovascular;;  . POLYPECTOMY N/A 05/20/2015   Procedure: EGD with polypectomy;  Surgeon: Rogene Houston, MD;  [facility];  Service: Endoscopy;  Laterality: N/A;  . WISDOM TOOTH EXTRACTION      Social History   Socioeconomic History  . Marital status: Married    Spouse name: Not on file  . Number of children: Not on file  . Years of education: Not on file  . Highest education level: Not on file  Occupational History  . Not on file  Social Needs  . Financial resource strain: Not on file  . Food insecurity:    Worry: Not on file    Inability: Not on file  . Transportation needs:    Medical: Not on file    Non-medical: Not on file  Tobacco Use  . Smoking status: Former Smoker    Packs/day: 1.00    Years: 18.00    Pack years: 18.00  Types: Cigarettes    Last attempt to quit: 05/11/1979    Years since quitting: 38.5  . Smokeless tobacco: Never Used  Substance and Sexual Activity  . Alcohol use: No  . Drug use: No  . Sexual activity: Yes    Birth control/protection: Post-menopausal  Lifestyle  . Physical activity:    Days per week: Not on file    Minutes per session: Not on file  . Stress: Not on file  Relationships  . Social connections:    Talks on phone: Not on file    Gets together: Not on file    Attends religious service: Not on file    Active member of club or organization: Not on file    Attends meetings of clubs or organizations: Not on file    Relationship status: Not on file  . Intimate partner violence:    Fear of current or ex partner: Not on file    Emotionally abused: Not on file    Physically abused: Not on file    Forced sexual activity: Not on file  Other Topics Concern  . Not on file  Social History Narrative  . Not on file     Vitals:   11/30/17 1308 11/30/17 1309  BP: 132/70 130/68  Pulse: 81    SpO2: 92%   Weight: 125 lb (56.7 kg)   Height: 5\' 2"  (1.575 m)     Wt Readings from Last 3 Encounters:  11/30/17 125 lb (56.7 kg)  05/29/15 139 lb 9.6 oz (63.3 kg)  05/20/15 134 lb (60.8 kg)     PHYSICAL EXAM General: NAD HEENT: Normal. Neck: No JVD, no thyromegaly. Lungs: Clear to auscultation bilaterally with normal respiratory effort. CV: Regular rate and rhythm, normal S1/S2, no M0/N0, 2/6 pansystolic murmur along left lower sternal border.  Trivial bilateral peri-ankle edema with bilateral venous varicosities.   Abdomen: Soft, nontender, no distention.  Neurologic: Alert and oriented.  Psych: Normal affect. Skin: Normal. Musculoskeletal: No gross deformities.    ECG: Most recent ECG reviewed.   Labs: Lab Results  Component Value Date/Time   K 4.4 05/29/2015 01:55 AM   BUN 26 (H) 05/29/2015 01:55 AM   CREATININE 0.54 05/29/2015 01:55 AM   ALT 12 (L) 05/28/2015 03:20 AM   HGB 8.8 (L) 05/30/2015 08:45 AM     Lipids: No results found for: LDLCALC, LDLDIRECT, CHOL, TRIG, HDL     ASSESSMENT AND PLAN: 1.  Coronary disease with LAD stent: LAD stent was patent by coronary angiography in 2016 as detailed above.  She is on isosorbide dinitrate 30 mg daily, Lipitor 10 mg daily, and aspirin 81 mg daily.  In order to improve symptoms of exertional dyspnea, I will increase isosorbide dinitrate to 30 mg twice daily and start metoprolol tartrate 25 mg twice daily.  I will then follow-up with her in 3 months after she is also received IV iron to reassess her symptoms.  2.  Hypertension: Blood pressure is normal.  I will monitor given increased dose of isosorbide dinitrate and initiation of metoprolol tartrate.  3.  Hyperlipidemia: She is on atorvastatin 10 mg.  4.  Carotid artery stenosis: She has minimal disease and does not require further evaluation.  5.  Aortic valve disease: Mild aortic regurgitation as noted above.  There was no evidence of stenosis.  6.  LVOT  gradient with exertional dyspnea:  In order to improve symptoms of exertional dyspnea, I will  increase isosorbide dinitrate to 30 mg twice daily  and start metoprolol tartrate 25 mg twice daily.  I will then follow-up with her in 3 months after she is also received IV iron to reassess her symptoms.    Disposition: Follow up 3 months in [place] office  Time spent: 40 minutes, of which greater than 50% was spent reviewing symptoms, relevant blood tests and studies, and discussing management plan with the patient.    [name], M.D., F.A.C.C.

## 2017-12-05 DIAGNOSIS — K317 Polyp of stomach and duodenum: Secondary | ICD-10-CM | POA: Diagnosis not present

## 2017-12-05 DIAGNOSIS — E782 Mixed hyperlipidemia: Secondary | ICD-10-CM | POA: Diagnosis not present

## 2017-12-05 DIAGNOSIS — Z6821 Body mass index (BMI) 21.0-21.9, adult: Secondary | ICD-10-CM | POA: Diagnosis not present

## 2017-12-05 DIAGNOSIS — D509 Iron deficiency anemia, unspecified: Secondary | ICD-10-CM | POA: Diagnosis not present

## 2017-12-05 DIAGNOSIS — M810 Age-related osteoporosis without current pathological fracture: Secondary | ICD-10-CM | POA: Diagnosis not present

## 2017-12-05 DIAGNOSIS — R42 Dizziness and giddiness: Secondary | ICD-10-CM | POA: Diagnosis not present

## 2017-12-05 DIAGNOSIS — E119 Type 2 diabetes mellitus without complications: Secondary | ICD-10-CM | POA: Diagnosis not present

## 2017-12-05 DIAGNOSIS — I10 Essential (primary) hypertension: Secondary | ICD-10-CM | POA: Diagnosis not present

## 2017-12-07 DIAGNOSIS — D500 Iron deficiency anemia secondary to blood loss (chronic): Secondary | ICD-10-CM | POA: Diagnosis not present

## 2017-12-13 DIAGNOSIS — D500 Iron deficiency anemia secondary to blood loss (chronic): Secondary | ICD-10-CM | POA: Diagnosis not present

## 2018-01-31 DIAGNOSIS — D519 Vitamin B12 deficiency anemia, unspecified: Secondary | ICD-10-CM | POA: Diagnosis not present

## 2018-01-31 DIAGNOSIS — D649 Anemia, unspecified: Secondary | ICD-10-CM | POA: Diagnosis not present

## 2018-01-31 DIAGNOSIS — R944 Abnormal results of kidney function studies: Secondary | ICD-10-CM | POA: Diagnosis not present

## 2018-01-31 DIAGNOSIS — N3281 Overactive bladder: Secondary | ICD-10-CM | POA: Diagnosis not present

## 2018-01-31 DIAGNOSIS — D529 Folate deficiency anemia, unspecified: Secondary | ICD-10-CM | POA: Diagnosis not present

## 2018-02-05 DIAGNOSIS — N329 Bladder disorder, unspecified: Secondary | ICD-10-CM | POA: Diagnosis not present

## 2018-02-05 DIAGNOSIS — K449 Diaphragmatic hernia without obstruction or gangrene: Secondary | ICD-10-CM | POA: Diagnosis not present

## 2018-02-05 DIAGNOSIS — R319 Hematuria, unspecified: Secondary | ICD-10-CM | POA: Diagnosis not present

## 2018-02-06 DIAGNOSIS — E782 Mixed hyperlipidemia: Secondary | ICD-10-CM | POA: Diagnosis not present

## 2018-02-06 DIAGNOSIS — L299 Pruritus, unspecified: Secondary | ICD-10-CM | POA: Diagnosis not present

## 2018-02-06 DIAGNOSIS — D509 Iron deficiency anemia, unspecified: Secondary | ICD-10-CM | POA: Diagnosis not present

## 2018-02-06 DIAGNOSIS — E1140 Type 2 diabetes mellitus with diabetic neuropathy, unspecified: Secondary | ICD-10-CM | POA: Diagnosis not present

## 2018-02-06 DIAGNOSIS — Z6821 Body mass index (BMI) 21.0-21.9, adult: Secondary | ICD-10-CM | POA: Diagnosis not present

## 2018-02-06 DIAGNOSIS — H9193 Unspecified hearing loss, bilateral: Secondary | ICD-10-CM | POA: Diagnosis not present

## 2018-02-06 DIAGNOSIS — B351 Tinea unguium: Secondary | ICD-10-CM | POA: Diagnosis not present

## 2018-02-06 DIAGNOSIS — I10 Essential (primary) hypertension: Secondary | ICD-10-CM | POA: Diagnosis not present

## 2018-03-02 ENCOUNTER — Ambulatory Visit (INDEPENDENT_AMBULATORY_CARE_PROVIDER_SITE_OTHER): Payer: Medicare Other | Admitting: Cardiovascular Disease

## 2018-03-02 ENCOUNTER — Encounter: Payer: Self-pay | Admitting: Cardiovascular Disease

## 2018-03-02 VITALS — BP 118/63 | HR 48 | Ht 62.0 in | Wt 122.8 lb

## 2018-03-02 DIAGNOSIS — I10 Essential (primary) hypertension: Secondary | ICD-10-CM | POA: Diagnosis not present

## 2018-03-02 DIAGNOSIS — E785 Hyperlipidemia, unspecified: Secondary | ICD-10-CM | POA: Diagnosis not present

## 2018-03-02 DIAGNOSIS — Z955 Presence of coronary angioplasty implant and graft: Secondary | ICD-10-CM

## 2018-03-02 DIAGNOSIS — R001 Bradycardia, unspecified: Secondary | ICD-10-CM | POA: Diagnosis not present

## 2018-03-02 DIAGNOSIS — I25118 Atherosclerotic heart disease of native coronary artery with other forms of angina pectoris: Secondary | ICD-10-CM

## 2018-03-02 DIAGNOSIS — R0609 Other forms of dyspnea: Secondary | ICD-10-CM | POA: Diagnosis not present

## 2018-03-02 NOTE — Patient Instructions (Addendum)
Medication Instructions:   Stop Metoprolol.  Continue all other medications.    Labwork: none  Testing/Procedures:  Your physician has recommended that you wear an event monitor. Event monitors are medical devices that record the heart's electrical activity. Doctors most often [place] these monitors to diagnose arrhythmias. Arrhythmias are problems with the speed or rhythm of the heartbeat. The monitor is a small, portable device. You can wear one while you do your normal daily activities. This is usually used to diagnose what is causing palpitations/syncope (passing out).  Office will contact with results via phone or letter.    Follow-Up: 2-3 months   Any Other Special Instructions Will Be Listed Below (If Applicable).  If you need a refill on your cardiac medications before your next appointment, please call your pharmacy.

## 2018-03-02 NOTE — Progress Notes (Signed)
SUBJECTIVE: The patient presents for routine follow-up.  I increased nitrates and metoprolol at her last visit.  She has an LAD stent and a large hiatal hernia.  An echocardiogram performed at [facility] on 10/09/2017 demonstrated normal left ventricular systolic function, LVEF 55 to 60%, moderate concentric LVH, grade 2 diastolic dysfunction with elevated left ventricular end-diastolic pressures, mild aortic, mitral, and tricuspid regurgitation.  There was basal septal hypertrophy and systolic anterior motion of the mitral valve creating an outflow tract gradient which is severe with peak velocity greater than 5 m/s and peak gradient greater than 125 mmHg with Valsalva.  She underwent right and left heart catheterization and coronary angiography in July 2016. This demonstrated normal right heart pressures, cardiac output , and cardiac index. The LAD stent was patent.   Since her last visit with me she received IV iron.  She denies chest pain but does continue to have some exertional dyspnea.  She has some midthoracic back pain and cervical spine pain as well.  She denies dizziness, palpitations, and syncope.  She has not taken metoprolol for over a week.  Her husband checks her blood pressure, heart rate, and oxygen saturations.  He said heart rates are usually over 60 bpm but have dipped down to the 40 bpm range.  It is 48 bpm today by auscultation.  O2 saturations are consistently over 90% as per her husband.     Review of Systems: As per "subjective", otherwise negative.  Allergies  Allergen Reactions  . Brilinta [Ticagrelor] Nausea And Vomiting and Other (See Comments)    dyspnea  . Iodine Nausea And Vomiting  . Shellfish Allergy Nausea And Vomiting and Other (See Comments)    Unsteady gait   . Streptomycin Nausea And Vomiting  . Iodinated Diagnostic Agents Nausea Only    Current Outpatient Medications  Medication Sig Dispense Refill  . amLODipine (NORVASC) 5 MG tablet  Take 5 mg by mouth daily. for high blood pressure  3  . Ascorbic Acid (VITAMIN C) 500 MG CAPS Take 500 mg by mouth daily.     Marland Kitchen aspirin EC 81 MG tablet Take 81 mg by mouth daily.    Marland Kitchen atorvastatin (LIPITOR) 10 MG tablet Take 10 mg by mouth daily at 6 PM.   3  . Biotin 10000 MCG TABS Take 10,000 mcg by mouth 2 (two) times daily.     . Calcium Carbonate-Vit D-Min (CALCIUM 1200) 1200-1000 MG-UNIT CHEW Chew 1 tablet by mouth 2 (two) times daily.    Marland Kitchen dimenhyDRINATE (TRAVEL SICKNESS) 50 MG tablet Take 100 mg by mouth daily.    . isosorbide mononitrate (IMDUR) 30 MG 24 hr tablet Take 30 mg by mouth daily.  3  . metoprolol tartrate (LOPRESSOR) 25 MG tablet Take 1 tablet (25 mg total) by mouth 2 (two) times daily. 180 tablet 3  . nitroGLYCERIN (NITROSTAT) 0.4 MG SL tablet Place 1 tablet (0.4 mg total) under the tongue every 5 (five) minutes as needed for chest pain. 25 tablet 3   No current facility-administered medications for this visit.     Past Medical History:  Diagnosis Date  . Anemia   . Blood transfusion   . Cancer (HCC)    Skin  . Cardiogenic shock ([place])    a. 05/2011 requiring pressor therapy.  . Carotid artery disease ([place])    a. 5-78% RICA, 46-96% LICA 08/9526.  Marland Kitchen Coronary artery disease    a. NSTEMI 05/2011 s/p DES to LAD, with ?  Takotsubo distribution of cardiomyopathy at that time with cardiogenic shock. b. NSTEMI 06/2011 ?spasm - stable CAD, normal EF. c. Patent LAD stent, nonobst CAD by cath 08/20/12 with normal EF. d. Hx dyspnea with Brilinta.  Marland Kitchen GERD (gastroesophageal reflux disease)   . Hyperlipidemia   . Hypertension   . Hypertrophic cardiomyopathy ([place])    a. echo 08/2012: focal basal septal hypertrophy, mildly elevated gradient 61mmHg across LVOT.  . Iron deficiency    a. Hx iron infusion.  . Mitral regurgitation   . Nasal polyp   . Pneumonia   . Sleep apnea   . Stress-induced cardiomyopathy 05/2011   a. ?Takotubso cardiomyopathy - distribution was different than that  of LAD stenosis.    Past Surgical History:  Procedure Laterality Date  . ABDOMINAL HYSTERECTOMY    . APPENDECTOMY    . CARDIAC CATHETERIZATION  05/2011   80% mid LAD, 40% LCX. EF: 35-40%.   . CARDIAC CATHETERIZATION  06/22/2011   patent LAD stent. Normal EF  . CARDIAC CATHETERIZATION  08/2012   Patent LAD stent with normal ejection fraction.  Marland Kitchen CARDIAC CATHETERIZATION N/A 01/19/2015   Procedure: Right/Left Heart Cath and Coronary Angiography;  Surgeon: Jettie Booze, MD;  Location: [facility];  Service: Cardiovascular;  Laterality: N/A;  . CORONARY ANGIOPLASTY WITH STENT PLACEMENT  05/2011   LAD: 2.5 X 16 mm Promus DES  . ESOPHAGOGASTRODUODENOSCOPY N/A 05/23/2014   Procedure: ESOPHAGOGASTRODUODENOSCOPY (EGD);  Surgeon: Rogene Houston, MD;  [facility];  Service: Endoscopy;  Laterality: N/A;  230 - moved to 1:05 - Ann notified pt  . ESOPHAGOGASTRODUODENOSCOPY N/A 05/20/2015   Procedure: ESOPHAGOGASTRODUODENOSCOPY (EGD);  Surgeon: Rogene Houston, MD;  [facility];  Service: Endoscopy;  Laterality: N/A;  1:00  . ESOPHAGOGASTRODUODENOSCOPY N/A 05/28/2015   Procedure: ESOPHAGOGASTRODUODENOSCOPY (EGD);  Surgeon: Rogene Houston, MD;  [facility];  Service: Endoscopy;  Laterality: N/A;  . GIVENS CAPSULE STUDY N/A 05/12/2014   Procedure: GIVENS CAPSULE STUDY;  Surgeon: Rogene Houston, MD;  [facility];  Service: Endoscopy;  Laterality: N/A;  730  . LEFT HEART CATHETERIZATION WITH CORONARY ANGIOGRAM N/A 05/12/2011   Procedure: LEFT HEART CATHETERIZATION WITH CORONARY ANGIOGRAM;  Surgeon: Troy Sine, MD;  Location: [facility] CATH LAB;  Service: Cardiovascular;  Laterality: N/A;  . LEFT HEART CATHETERIZATION WITH CORONARY ANGIOGRAM N/A 06/22/2011   Procedure: LEFT HEART CATHETERIZATION WITH CORONARY ANGIOGRAM;  Surgeon: Wellington Hampshire, MD;  Location: [place] CATH LAB;  Service: Cardiovascular;  Laterality: N/A;  . LEFT HEART CATHETERIZATION  WITH CORONARY ANGIOGRAM N/A 08/20/2012   Procedure: LEFT HEART CATHETERIZATION WITH CORONARY ANGIOGRAM;  Surgeon: Burnell Blanks, MD;  Location: [facility] CATH LAB;  Service: Cardiovascular;  Laterality: N/A;  . PARTIAL HYSTERECTOMY    . PERCUTANEOUS CORONARY STENT INTERVENTION (PCI-S)  05/12/2011   Procedure: PERCUTANEOUS CORONARY STENT INTERVENTION (PCI-S);  Surgeon: Troy Sine, MD;  Location: [facility] CATH LAB;  Service: Cardiovascular;;  . POLYPECTOMY N/A 05/20/2015   Procedure: EGD with polypectomy;  Surgeon: Rogene Houston, MD;  [facility];  Service: Endoscopy;  Laterality: N/A;  . WISDOM TOOTH EXTRACTION      Social History   Socioeconomic History  . Marital status: Married    Spouse name: Not on file  . Number of children: Not on file  . Years of education: Not on file  . Highest education level: Not on file  Occupational History  . Not on file  Social  Needs  . Financial resource strain: Not on file  . Food insecurity:    Worry: Not on file    Inability: Not on file  . Transportation needs:    Medical: Not on file    Non-medical: Not on file  Tobacco Use  . Smoking status: Former Smoker    Packs/day: 1.00    Years: 18.00    Pack years: 18.00    Types: Cigarettes    Last attempt to quit: 05/11/1979    Years since quitting: 38.8  . Smokeless tobacco: Never Used  Substance and Sexual Activity  . Alcohol use: No  . Drug use: No  . Sexual activity: Yes    Birth control/protection: Post-menopausal  Lifestyle  . Physical activity:    Days per week: Not on file    Minutes per session: Not on file  . Stress: Not on file  Relationships  . Social connections:    Talks on phone: Not on file    Gets together: Not on file    Attends religious service: Not on file    Active member of club or organization: Not on file    Attends meetings of clubs or organizations: Not on file    Relationship status: Not on file  . Intimate partner violence:    Fear of current  or ex partner: Not on file    Emotionally abused: Not on file    Physically abused: Not on file    Forced sexual activity: Not on file  Other Topics Concern  . Not on file  Social History Narrative  . Not on file     Vitals:   03/02/18 1043  BP: 118/63  Pulse: (!) 48  Weight: 122 lb 12.8 oz (55.7 kg)  Height: 5\' 2"  (1.575 m)    Wt Readings from Last 3 Encounters:  03/02/18 122 lb 12.8 oz (55.7 kg)  11/30/17 125 lb (56.7 kg)  05/29/15 139 lb 9.6 oz (63.3 kg)     PHYSICAL EXAM General: NAD HEENT: Normal. Neck: No JVD, no thyromegaly. Lungs: Clear to auscultation bilaterally with normal respiratory effort. CV: Bradycardic, regular rhythm, normal S1/S2, no U2/P5, 1/6 pansystolic murmur along left lower sternal border.  Trivial bilateral peri-ankle edema with bilateral venous varicosities.   Abdomen: Soft, nontender, no distention.  Neurologic: Alert and oriented.  Psych: Normal affect. Skin: Normal. Musculoskeletal: No gross deformities.    ECG: Reviewed above under Subjective   Labs: Lab Results  Component Value Date/Time   K 4.4 05/29/2015 01:55 AM   BUN 26 (H) 05/29/2015 01:55 AM   CREATININE 0.54 05/29/2015 01:55 AM   ALT 12 (L) 05/28/2015 03:20 AM   HGB 8.8 (L) 05/30/2015 08:45 AM     Lipids: No results found for: LDLCALC, LDLDIRECT, CHOL, TRIG, HDL     ASSESSMENT AND PLAN:  1.  Coronary disease with LAD stent: LAD stent was patent by coronary angiography in 2016 as detailed above.  She was on isosorbide dinitrate 30 mg daily, Lipitor 10 mg daily, and aspirin 81 mg daily.  In order to improve symptoms of exertional dyspnea, I previously increased isosorbide dinitrate to 30 mg twice daily and started metoprolol tartrate 25 mg twice daily.  However, it appears this was switched to Imdur 30 mg.  She has not taken metoprolol for over a week and is bradycardic.  She continues to experience exertional dyspnea.  I am going to obtain a 2-week event monitor to  assess for symptomatic bradycardia.  If this  is unremarkable, I would consider a Lexiscan Myoview stress test to evaluate for ischemia in the LAD territory.  2.  Hypertension: Blood pressure is normal.  No changes to therapy.  3.  Hyperlipidemia: She is on atorvastatin 10 mg.  4.  Carotid artery stenosis: She has minimal disease and does not require further evaluation.  5.  Aortic valve disease: Mild aortic regurgitation as noted above.  There was no evidence of stenosis.  6.  LVOT gradient with exertional dyspnea:  In order to improve symptoms of exertional dyspnea, I previously increased isosorbide dinitrate to 30 mg twice daily and started metoprolol tartrate 25 mg twice daily.    This has apparently been switched to Imdur 30 mg.  She has not taken metoprolol in over a week and is bradycardic.  I will obtain a 1 week event monitor to assess for symptomatic bradycardia.  7.  Bradycardia: She has been off of metoprolol for over a week with a heart rate in the 40 bpm range during this office visit by auscultation.  I will obtain a 2 week event monitor to assess for symptomatic bradycardia as a potential etiology of her exertional dyspnea.   Disposition: Follow up 2-3 months.  Time spent: 40 minutes, of which greater than 50% was spent reviewing symptoms, relevant blood tests and studies, and discussing management plan with the patient.    [name], M.D., F.A.C.C.

## 2018-03-12 ENCOUNTER — Telehealth: Payer: Self-pay | Admitting: Cardiovascular Disease

## 2018-03-12 ENCOUNTER — Ambulatory Visit (INDEPENDENT_AMBULATORY_CARE_PROVIDER_SITE_OTHER): Payer: Medicare Other | Admitting: Otolaryngology

## 2018-03-12 NOTE — Telephone Encounter (Signed)
Called [employer] and they are unable to find where pt was enrolled

## 2018-03-12 NOTE — Telephone Encounter (Signed)
Patient walk in   Has not received monitor yet

## 2018-03-12 NOTE — Telephone Encounter (Signed)
Patient informed monitor order enrolled today with [employer].

## 2018-03-15 DIAGNOSIS — E785 Hyperlipidemia, unspecified: Secondary | ICD-10-CM | POA: Diagnosis not present

## 2018-03-15 DIAGNOSIS — I252 Old myocardial infarction: Secondary | ICD-10-CM | POA: Diagnosis not present

## 2018-03-15 DIAGNOSIS — R310 Gross hematuria: Secondary | ICD-10-CM | POA: Diagnosis not present

## 2018-03-15 DIAGNOSIS — I429 Cardiomyopathy, unspecified: Secondary | ICD-10-CM | POA: Diagnosis not present

## 2018-03-15 DIAGNOSIS — R5383 Other fatigue: Secondary | ICD-10-CM | POA: Diagnosis not present

## 2018-03-15 DIAGNOSIS — I10 Essential (primary) hypertension: Secondary | ICD-10-CM | POA: Diagnosis not present

## 2018-03-15 DIAGNOSIS — J449 Chronic obstructive pulmonary disease, unspecified: Secondary | ICD-10-CM | POA: Diagnosis not present

## 2018-03-15 DIAGNOSIS — Z7982 Long term (current) use of aspirin: Secondary | ICD-10-CM | POA: Diagnosis not present

## 2018-03-15 DIAGNOSIS — D500 Iron deficiency anemia secondary to blood loss (chronic): Secondary | ICD-10-CM | POA: Diagnosis not present

## 2018-03-15 DIAGNOSIS — G4730 Sleep apnea, unspecified: Secondary | ICD-10-CM | POA: Diagnosis not present

## 2018-03-15 DIAGNOSIS — M810 Age-related osteoporosis without current pathological fracture: Secondary | ICD-10-CM | POA: Diagnosis not present

## 2018-03-15 DIAGNOSIS — I2510 Atherosclerotic heart disease of native coronary artery without angina pectoris: Secondary | ICD-10-CM | POA: Diagnosis not present

## 2018-03-16 ENCOUNTER — Ambulatory Visit (INDEPENDENT_AMBULATORY_CARE_PROVIDER_SITE_OTHER): Payer: Medicare Other

## 2018-03-16 DIAGNOSIS — R001 Bradycardia, unspecified: Secondary | ICD-10-CM

## 2018-03-26 DIAGNOSIS — R828 Abnormal findings on cytological and histological examination of urine: Secondary | ICD-10-CM | POA: Diagnosis not present

## 2018-03-26 DIAGNOSIS — D500 Iron deficiency anemia secondary to blood loss (chronic): Secondary | ICD-10-CM | POA: Diagnosis not present

## 2018-03-27 DIAGNOSIS — R8279 Other abnormal findings on microbiological examination of urine: Secondary | ICD-10-CM | POA: Diagnosis not present

## 2018-03-27 DIAGNOSIS — R310 Gross hematuria: Secondary | ICD-10-CM | POA: Diagnosis not present

## 2018-03-27 DIAGNOSIS — D414 Neoplasm of uncertain behavior of bladder: Secondary | ICD-10-CM | POA: Diagnosis not present

## 2018-03-28 ENCOUNTER — Other Ambulatory Visit: Payer: Self-pay | Admitting: Urology

## 2018-03-28 ENCOUNTER — Other Ambulatory Visit: Payer: Self-pay

## 2018-03-28 ENCOUNTER — Encounter (HOSPITAL_BASED_OUTPATIENT_CLINIC_OR_DEPARTMENT_OTHER): Payer: Self-pay | Admitting: *Deleted

## 2018-03-28 NOTE — Progress Notes (Addendum)
Spoke w/ pt and husband via phone for pre-op interview.  Npo after mn.   Arrive at 0700.  Need istat.  Current ekg in chart and epic.  Will take isosorbide, norvasc, and lipitor am dos w/ sips of water.  Per pt currently is wearing a holter monitor and stated Friday 03-30-2018 is last day to be montiored, advised pt to call whom ordered monitor and get advise since having surgery the same day, pt verbalized understanding.

## 2018-03-29 NOTE — Progress Notes (Signed)
Pt currently wearing holter monitor.  Chart reviewed with Dr. Kalman Shan.  Pt cancelled. Will need completion of cardiac workup and clearance prior to surgery.  Spoke w Darrel Reach at [facility] urology.  Pam informed of cancellation per Dr. Kalman Shan.

## 2018-03-30 ENCOUNTER — Ambulatory Visit (HOSPITAL_BASED_OUTPATIENT_CLINIC_OR_DEPARTMENT_OTHER): Admit: 2018-03-30 | Payer: Medicare Other | Admitting: Urology

## 2018-03-30 ENCOUNTER — Telehealth: Payer: Self-pay | Admitting: Cardiovascular Disease

## 2018-03-30 HISTORY — DX: Unspecified osteoarthritis, unspecified site: M19.90

## 2018-03-30 HISTORY — DX: Diaphragmatic hernia without obstruction or gangrene: K44.9

## 2018-03-30 HISTORY — DX: Dyspnea, unspecified: R06.00

## 2018-03-30 HISTORY — DX: Personal history of other malignant neoplasm of skin: Z85.828

## 2018-03-30 HISTORY — DX: Dizziness and giddiness: R42

## 2018-03-30 HISTORY — DX: Other fatigue: R53.83

## 2018-03-30 HISTORY — DX: Unspecified urinary incontinence: R32

## 2018-03-30 HISTORY — DX: Obstructive sleep apnea (adult) (pediatric): G47.33

## 2018-03-30 HISTORY — DX: Iron deficiency anemia secondary to blood loss (chronic): D50.0

## 2018-03-30 HISTORY — DX: Neoplasm of unspecified behavior of bladder: D49.4

## 2018-03-30 HISTORY — DX: Nocturia: R35.1

## 2018-03-30 HISTORY — DX: Other forms of dyspnea: R06.09

## 2018-03-30 HISTORY — DX: Benign neoplasm of pituitary gland: D35.2

## 2018-03-30 HISTORY — DX: Prediabetes: R73.03

## 2018-03-30 HISTORY — DX: Overactive bladder: N32.81

## 2018-03-30 HISTORY — DX: Polyneuropathy, unspecified: G62.9

## 2018-03-30 HISTORY — DX: Presence of coronary angioplasty implant and graft: Z95.5

## 2018-03-30 HISTORY — DX: Old myocardial infarction: I25.2

## 2018-03-30 HISTORY — DX: Personal history of other diseases of the digestive system: Z87.19

## 2018-03-30 HISTORY — DX: Presence of external hearing-aid: Z97.4

## 2018-03-30 HISTORY — DX: Scoliosis, unspecified: M41.9

## 2018-03-30 SURGERY — TURBT (TRANSURETHRAL RESECTION OF BLADDER TUMOR)
Anesthesia: General

## 2018-03-30 NOTE — Telephone Encounter (Signed)
LM to return call.

## 2018-03-30 NOTE — Telephone Encounter (Signed)
Patient was scheduled for surgery today 03-30-18 (TRANSURETHRAL RESECTION OF BLADDER TUMOR (TURBT) WITH POST OPERATIVE INSTILLATION OF CHEMOTHERAPY) surgery was cancelled due to needing clearance for surgery plus monitor results.

## 2018-04-02 ENCOUNTER — Ambulatory Visit (INDEPENDENT_AMBULATORY_CARE_PROVIDER_SITE_OTHER): Payer: Medicare Other | Admitting: Otolaryngology

## 2018-04-02 DIAGNOSIS — H608X1 Other otitis externa, right ear: Secondary | ICD-10-CM | POA: Diagnosis not present

## 2018-04-02 DIAGNOSIS — H6121 Impacted cerumen, right ear: Secondary | ICD-10-CM

## 2018-04-02 DIAGNOSIS — H903 Sensorineural hearing loss, bilateral: Secondary | ICD-10-CM | POA: Diagnosis not present

## 2018-04-02 NOTE — Telephone Encounter (Signed)
Will forward to covering provider - pt has called back

## 2018-04-02 NOTE — Telephone Encounter (Signed)
Heart monitor just uploaded into our system to be read this morning, it typically takes 3-5 business days to get results processed from company, have them uploaded into our system and have physician review results and have results reported back to patient. Dr Raliegh Ip will review tomorrow and be in touch with patient regarding her clearance. Please forward message to his box.   Zandra Abts MD

## 2018-04-02 NOTE — Telephone Encounter (Signed)
Pt walked into office requesting results on monitor so that she can proceed with surgery - will forward to provider

## 2018-04-02 NOTE — Telephone Encounter (Signed)
End of service on monitor received on 03/31/2018.  Final results has been sent to Dr. Bronson Ing.

## 2018-04-02 NOTE — Telephone Encounter (Signed)
Pt made aware that should have results tomorrow

## 2018-04-02 NOTE — Telephone Encounter (Signed)
Let he know should hear back tomorrow regarding final results and pending clearance    J Maelani Yarbro MD

## 2018-04-03 ENCOUNTER — Telehealth: Payer: Self-pay | Admitting: *Deleted

## 2018-04-03 ENCOUNTER — Telehealth: Payer: Self-pay | Admitting: Cardiovascular Disease

## 2018-04-03 DIAGNOSIS — R0609 Other forms of dyspnea: Principal | ICD-10-CM

## 2018-04-03 NOTE — Telephone Encounter (Signed)
This has been resulted with the event monitor.  She needs a Lexicographer for exertional dyspnea.

## 2018-04-03 NOTE — Telephone Encounter (Signed)
Notes recorded by Laurine Blazer, LPN on [id number]/0/6893 at 4:06 PM EDT Victoria Short (husband) notified. She agrees to doing stress test. Will put order in [place] to [facility] for scheduling. ------  Notes recorded by Laurine Blazer, LPN on 84/0/3353 at 3:17 PM EDT Left message to return call on home & mobile number. ------  Notes recorded by Herminio Commons, MD on 04/03/2018 at 12:26 PM EDT No arrhythmias seen (no symptomatic bradycardia in particular). As per my most recent office note, proceed with Lexiscan Myoview to evaluate for ischemia in the LAD territory for symptoms of exertional dyspnea.

## 2018-04-03 NOTE — Telephone Encounter (Signed)
Pre-cert Verification for the following procedure   Lexiscan scheduled for 04-05-2018 at [facility]

## 2018-04-03 NOTE — Telephone Encounter (Signed)
To be scheduled

## 2018-04-04 ENCOUNTER — Encounter: Payer: Self-pay | Admitting: *Deleted

## 2018-04-04 NOTE — Telephone Encounter (Signed)
lexiscan scheduled for 10-03 At [facility].  Patient needs to arrive @ 745 am to register   Husband Lorenso Courier) notified of appointment for tomorrow with instructions given.  Husband has OV this morning in [place] so I will fax instructions over for them to give to patient as well.  He verbalized understanding.

## 2018-04-05 ENCOUNTER — Encounter (HOSPITAL_BASED_OUTPATIENT_CLINIC_OR_DEPARTMENT_OTHER)
Admission: RE | Admit: 2018-04-05 | Discharge: 2018-04-05 | Disposition: A | Discharge status: Home / Self Care | Payer: Medicare Other | Source: Ambulatory Visit | Attending: Cardiovascular Disease | Admitting: Cardiovascular Disease

## 2018-04-05 ENCOUNTER — Encounter (HOSPITAL_COMMUNITY): Payer: Self-pay

## 2018-04-05 ENCOUNTER — Encounter (HOSPITAL_COMMUNITY)
Admission: RE | Admit: 2018-04-05 | Discharge: 2018-04-05 | Disposition: A | Discharge status: Home / Self Care | Payer: Medicare Other | Source: Ambulatory Visit | Attending: Cardiovascular Disease | Admitting: Cardiovascular Disease

## 2018-04-05 DIAGNOSIS — R0609 Other forms of dyspnea: Secondary | ICD-10-CM | POA: Insufficient documentation

## 2018-04-05 LAB — NM MYOCAR MULTI W/SPECT W/WALL MOTION / EF
CHL CUP NUCLEAR SDS: 0
CHL CUP NUCLEAR SRS: 1
CHL CUP RESTING HR STRESS: 54 {beats}/min
CSEPPHR: 77 {beats}/min
LHR: 0.31
LV dias vol: 77 mL (ref 46–106)
LV sys vol: 21 mL
SSS: 1
TID: 0.83

## 2018-04-05 MED ORDER — REGADENOSON 0.4 MG/5ML IV SOLN
INTRAVENOUS | Status: AC
  Administered 2018-04-05: 0.4 mg via INTRAVENOUS
  Filled 2018-04-05: qty 5

## 2018-04-05 MED ORDER — TECHNETIUM TC 99M TETROFOSMIN IV KIT
30.0000 | PACK | Freq: Once | INTRAVENOUS | Status: AC | PRN
  Administered 2018-04-05: 31.2 via INTRAVENOUS

## 2018-04-05 MED ORDER — SODIUM CHLORIDE 0.9% FLUSH
INTRAVENOUS | Status: AC
  Administered 2018-04-05: 10 mL via INTRAVENOUS
  Filled 2018-04-05: qty 10

## 2018-04-05 MED ORDER — TECHNETIUM TC 99M TETROFOSMIN IV KIT
10.0000 | PACK | Freq: Once | INTRAVENOUS | Status: AC | PRN
  Administered 2018-04-05: 10.8 via INTRAVENOUS

## 2018-04-06 ENCOUNTER — Telehealth: Payer: Self-pay | Admitting: *Deleted

## 2018-04-06 ENCOUNTER — Telehealth: Payer: Self-pay

## 2018-04-06 NOTE — Telephone Encounter (Signed)
Notes recorded by Laurine Blazer, LPN on 12/05/158 at 1:09 PM EDT Husband Lorenso Courier) notified. Correction - this is for Urologic surgery, not orthopedic surgery. Copy of results fwd to [facility] (Dr. Kathie Rhodes). ------  Notes recorded by Herminio Commons, MD on 04/06/2018 at 9:35 AM EDT Normal stress test. She can proceed with orthopedic surgery. Please send a copy to her PCP and orthopedic surgeon

## 2018-04-06 NOTE — Telephone Encounter (Signed)
Pt's husband called to get results of pt's stress test. Informed him of results. He states that [facility] needs a copy of the result.

## 2018-04-06 NOTE — Telephone Encounter (Signed)
Follow up scheduled 05/03/18 with [name] in [place] office.

## 2018-04-13 ENCOUNTER — Other Ambulatory Visit: Payer: Self-pay | Admitting: Urology

## 2018-04-18 DIAGNOSIS — R5383 Other fatigue: Secondary | ICD-10-CM | POA: Diagnosis not present

## 2018-04-18 DIAGNOSIS — J449 Chronic obstructive pulmonary disease, unspecified: Secondary | ICD-10-CM | POA: Diagnosis not present

## 2018-04-18 DIAGNOSIS — R310 Gross hematuria: Secondary | ICD-10-CM | POA: Diagnosis not present

## 2018-04-18 DIAGNOSIS — I2510 Atherosclerotic heart disease of native coronary artery without angina pectoris: Secondary | ICD-10-CM | POA: Diagnosis not present

## 2018-04-18 DIAGNOSIS — I252 Old myocardial infarction: Secondary | ICD-10-CM | POA: Diagnosis not present

## 2018-04-18 DIAGNOSIS — D500 Iron deficiency anemia secondary to blood loss (chronic): Secondary | ICD-10-CM | POA: Diagnosis not present

## 2018-04-18 DIAGNOSIS — I10 Essential (primary) hypertension: Secondary | ICD-10-CM | POA: Diagnosis not present

## 2018-04-18 DIAGNOSIS — I429 Cardiomyopathy, unspecified: Secondary | ICD-10-CM | POA: Diagnosis not present

## 2018-04-18 DIAGNOSIS — D494 Neoplasm of unspecified behavior of bladder: Secondary | ICD-10-CM | POA: Diagnosis not present

## 2018-04-18 DIAGNOSIS — Z79899 Other long term (current) drug therapy: Secondary | ICD-10-CM | POA: Diagnosis not present

## 2018-04-19 DIAGNOSIS — H43813 Vitreous degeneration, bilateral: Secondary | ICD-10-CM | POA: Diagnosis not present

## 2018-04-19 DIAGNOSIS — H353112 Nonexudative age-related macular degeneration, right eye, intermediate dry stage: Secondary | ICD-10-CM | POA: Diagnosis not present

## 2018-04-19 DIAGNOSIS — H26491 Other secondary cataract, right eye: Secondary | ICD-10-CM | POA: Diagnosis not present

## 2018-04-19 DIAGNOSIS — H353221 Exudative age-related macular degeneration, left eye, with active choroidal neovascularization: Secondary | ICD-10-CM | POA: Diagnosis not present

## 2018-04-23 ENCOUNTER — Encounter (HOSPITAL_COMMUNITY): Payer: Self-pay

## 2018-04-23 NOTE — Patient Instructions (Addendum)
Your procedure is scheduled on: Monday, Oct. 28, 2019   Surgery Time:  9:45AM-11:15AM   Report to [place]  Entrance    Report to admitting at 7:45 AM   Call this number if you have problems the morning of surgery [phone]   Do not eat food or drink liquids :After Midnight.   Brush your teeth the morning of surgery.   Do NOT smoke after Midnight   Take these medicines the morning of surgery with A SIP OF WATER: Amlodipine, Atorvastatin, Isosorbide, Levofloxacin                               You may not have any metal on your body including hair pins, jewelry, and body piercings             Do not wear make-up, lotions, powders, perfumes/cologne, or deodorant             Do not wear nail polish.  Do not shave  48 hours prior to surgery.                Do not bring valuables to the hospital. [place].   Contacts, dentures or bridgework may not be worn into surgery.    Patients discharged the day of surgery will not be allowed to drive home.   Special Instructions: Bring a copy of your healthcare power of attorney and living will documents         the day of surgery if you haven't scanned them in before.              Please read over the following fact sheets you were given:  [facility] - Preparing for Surgery Before surgery, you can play an important role.  Because skin is not sterile, your skin needs to be as free of germs as possible.  You can reduce the number of germs on your skin by washing with CHG (chlorahexidine gluconate) soap before surgery.  CHG is an antiseptic cleaner which kills germs and bonds with the skin to continue killing germs even after washing. Please DO NOT use if you have an allergy to CHG or antibacterial soaps.  If your skin becomes reddened/irritated stop using the CHG and inform your nurse when you arrive at Short Stay. Do not shave (including legs and underarms) for at least  48 hours prior to the first CHG shower.  You may shave your face/neck.  Please follow these instructions carefully:  1.  Shower with CHG Soap the night before surgery and the  morning of surgery.  2.  If you choose to wash your hair, wash your hair first as usual with your normal  shampoo.  3.  After you shampoo, rinse your hair and body thoroughly to remove the shampoo.                             4.  Use CHG as you would any other liquid soap.  You can apply chg directly to the skin and wash.  Gently with a scrungie or clean washcloth.  5.  Apply the CHG Soap to your body ONLY FROM THE NECK DOWN.   Do   not use on face/ open  Wound or open sores. Avoid contact with eyes, ears mouth and   genitals (private parts).                       Wash face,  Genitals (private parts) with your normal soap.             6.  Wash thoroughly, paying special attention to the area where your    surgery  will be performed.  7.  Thoroughly rinse your body with warm water from the neck down.  8.  DO NOT shower/wash with your normal soap after using and rinsing off the CHG Soap.                9.  Pat yourself dry with a clean towel.            10.  Wear clean pajamas.            11.  Place clean sheets on your bed the night of your first shower and do not  sleep with pets. Day of Surgery : Do not apply any lotions/deodorants the morning of surgery.  Please wear clean clothes to the hospital/surgery center.  FAILURE TO FOLLOW THESE INSTRUCTIONS MAY RESULT IN THE CANCELLATION OF YOUR SURGERY  PATIENT SIGNATURE_________________________________  NURSE SIGNATURE__________________________________  ________________________________________________________________________

## 2018-04-23 NOTE — Pre-Procedure Instructions (Signed)
The following are in epic: Cardiac clearance phone note 04/06/2018 Dr. Bronson Ing had a stress test 04/05/2018 EKG 10/04/2017 ECHO 10/09/2017

## 2018-04-24 ENCOUNTER — Encounter (HOSPITAL_COMMUNITY): Payer: Self-pay

## 2018-04-24 ENCOUNTER — Encounter (HOSPITAL_COMMUNITY)
Admission: RE | Admit: 2018-04-24 | Discharge: 2018-04-24 | Disposition: A | Discharge status: Home / Self Care | Payer: Medicare Other | Source: Ambulatory Visit | Attending: Urology | Admitting: Urology

## 2018-04-24 ENCOUNTER — Other Ambulatory Visit: Payer: Self-pay

## 2018-04-24 DIAGNOSIS — Z01812 Encounter for preprocedural laboratory examination: Secondary | ICD-10-CM | POA: Diagnosis not present

## 2018-04-24 HISTORY — DX: Prediabetes: R73.03

## 2018-04-24 HISTORY — DX: Other gastritis without bleeding: K29.60

## 2018-04-24 HISTORY — DX: Polyp of stomach and duodenum: K31.7

## 2018-04-24 HISTORY — DX: Constipation, unspecified: K59.00

## 2018-04-24 HISTORY — DX: Other specified symptoms and signs involving the circulatory and respiratory systems: R09.89

## 2018-04-24 HISTORY — DX: Malignant melanoma of skin, unspecified: C43.9

## 2018-04-24 HISTORY — DX: Unsteadiness on feet: R26.81

## 2018-04-24 HISTORY — DX: Dyspnea, unspecified: R06.00

## 2018-04-24 HISTORY — DX: Unspecified hemorrhoids: K64.9

## 2018-04-24 HISTORY — DX: Unspecified hearing loss, unspecified ear: H91.90

## 2018-04-24 HISTORY — DX: Vitamin D deficiency, unspecified: E55.9

## 2018-04-24 HISTORY — DX: Hematuria, unspecified: R31.9

## 2018-04-24 HISTORY — DX: Age-related osteoporosis without current pathological fracture: M81.0

## 2018-04-24 LAB — BASIC METABOLIC PANEL
ANION GAP: 6 (ref 5–15)
BUN: 20 mg/dL (ref 8–23)
CHLORIDE: 105 mmol/L (ref 98–111)
CO2: 35 mmol/L — AB (ref 22–32)
Calcium: 9.3 mg/dL (ref 8.9–10.3)
Creatinine, Ser: 0.71 mg/dL (ref 0.44–1.00)
GFR calc non Af Amer: >60 mL/min (ref >60)
Glucose, Bld: 114 mg/dL — ABNORMAL HIGH (ref 70–99)
Potassium: 3.9 mmol/L (ref 3.5–5.1)
Sodium: 146 mmol/L — ABNORMAL HIGH (ref 135–145)

## 2018-04-24 LAB — CBC
HEMATOCRIT: 45.7 % (ref 36.0–46.0)
Hemoglobin: 14.5 g/dL (ref 12.0–15.0)
MCH: 34.4 pg — ABNORMAL HIGH (ref 26.0–34.0)
MCHC: 31.7 g/dL (ref 30.0–36.0)
MCV: 108.6 fL — AB (ref 80.0–100.0)
NRBC: 0 % (ref 0.0–0.2)
PLATELETS: 159 10*3/uL (ref 150–400)
RBC: 4.21 MIL/uL (ref 3.87–5.11)
RDW: 13.5 % (ref 11.5–15.5)
WBC: 4.7 10*3/uL (ref 4.0–10.5)

## 2018-04-24 LAB — SURGICAL PCR SCREEN
MRSA, PCR: NEGATIVE
Staphylococcus aureus: NEGATIVE

## 2018-04-24 NOTE — Pre-Procedure Instructions (Signed)
CBC and BMP results 04/24/2018 sent to Dr. Karsten Ro via epic.

## 2018-04-25 LAB — HEMOGLOBIN A1C
HEMOGLOBIN A1C: 5.7 % — AB (ref 4.8–5.6)
Mean Plasma Glucose: 117 mg/dL

## 2018-04-29 NOTE — H&P (Signed)
HPI: Victoria Short is a 84 year-old female with a newly diagnosed bladder tumor.  She first noticed the symptoms approximately 11/01/2017. She did see the blood in her urine.   She does have a burning sensation when she urinates. She is not currently having trouble urinating.   She has not had kidney stones. She is not having pain. She has not recently had unwanted weight loss.   Her last U/S or CT Scan was 02/05/2018.   03/27/18: She reports that for about 4 months now he has had intermittent gross hematuria. He has not experienced any dysuria has no history of recurrent UTIs. She has significant voiding symptoms consisting of frequency, urgency and nocturia 6-7 times she has no history of stones. She smoked a pack a day for 9 years and quit about 20 years ago.     ALLERGIES: shell fish    MEDICATIONS: Aspirin 81 mg tablet,chewable  Amlodipine Besylate 5 mg tablet  Atorvastatin Calcium 10 mg tablet  Calcium  Iron 236 mg (27 mg iron) tablet  Isosorbide Mononitrate Er 30 mg tablet, extended release 24 hr  Stool Softener  Vitamin C  Vitamin D3     GU PSH: No GU PSH    NON-GU PSH: No Non-GU PSH    GU PMH: No GU PMH    NON-GU PMH: No Non-GU PMH    FAMILY HISTORY: No Family History    SOCIAL HISTORY: Marital Status: Married Preferred Language: English; Ethnicity: Not Hispanic Or Latino; Race: White Current Smoking Status: Patient does not smoke anymore.   Tobacco Use Assessment Completed: Used Tobacco in last 30 days? Has never drank.  Drinks 2 caffeinated drinks per day.    REVIEW OF SYSTEMS:    GU Review Female:   Patient reports frequent urination, hard to postpone urination, get up at night to urinate, and leakage of urine. Patient denies burning/ pain with urination, stream starts and stops, trouble starting your stream, have to strain to urinate , erection problems, and penile pain.  Gastrointestinal (Upper):   Patient denies nausea, vomiting, and indigestion/ heartburn.   Gastrointestinal (Lower):   Patient denies diarrhea and constipation.  Constitutional:   Patient denies fever, night sweats, weight loss, and fatigue.  Skin:   Patient denies skin rash/ lesion and itching.  Eyes:   Patient denies blurred vision and double vision.  Ears/ Nose/ Throat:   Patient denies sore throat and sinus problems.  Hematologic/Lymphatic:   Patient denies swollen glands and easy bruising.  Cardiovascular:   Patient denies leg swelling and chest pains.  Respiratory:   Patient denies cough and shortness of breath.  Endocrine:   Patient denies excessive thirst.  Musculoskeletal:   Patient denies back pain and joint pain.  Neurological:   Patient denies headaches and dizziness.  Psychologic:   Patient denies depression and anxiety.   VITAL SIGNS:      03/27/2018 02:49 PM  Weight 121 lb / 54.88 kg  Height 65 in / 165.1 cm  BP 124/66 mmHg  Pulse 67 /min  BMI 20.1 kg/m   MULTI-SYSTEM PHYSICAL EXAMINATION:    Constitutional: Well-nourished. No physical deformities. Normally developed. Good grooming.  Neck: Neck symmetrical, not swollen. Normal tracheal position.  Respiratory: No labored breathing, no use of accessory muscles.   Cardiovascular: Normal temperature, normal extremity pulses, no swelling, no varicosities.  Lymphatic: No enlargement of neck, axillae, groin.  Skin: No paleness, no jaundice, no cyanosis. No lesion, no ulcer, no rash.  Neurologic / Psychiatric: Oriented to  time, oriented to place, oriented to person. No depression, no anxiety, no agitation.  Gastrointestinal: No mass, no tenderness, no rigidity, non obese abdomen.  Eyes: Normal conjunctivae. Normal eyelids.  Ears, Nose, Mouth, and Throat: Left ear no scars, no lesions, no masses. Right ear no scars, no lesions, no masses. Nose no scars, no lesions, no masses. Normal hearing. Normal lips.  Musculoskeletal: Normal gait and station of head and neck.     PAST DATA REVIEWED:  Source Of History:   Patient  Lab Test Review:   BUN/Creatinine  Records Review:   Previous Doctor Records, Previous Hospital Records, POC Tool  X-Ray Review: C.T. Abdomen/Pelvis: Reviewed Report. A CT scan done on 02/05/18 revealed normal kidneys bilaterally with a bladder that was partially decompressed and had focal areas of nodular wall thickening along the right side.   Notes:                     Her creatinine in 4/19 was noted to be normal at 0.68.           Urinalysis w/Scope Dipstick Dipstick Cont'd Micro  Color: Amber Bilirubin: Neg mg/dL WBC/hpf: 6 - 10/hpf  Appearance: Cloudy Ketones: Neg mg/dL RBC/hpf: >60/hpf  Specific Gravity: 1.020 Blood: 3+ ery/uL Bacteria: Mod (26-50/hpf)  pH: 5.5 Protein: 3+ mg/dL Cystals: NS (Not Seen)  Glucose: Neg mg/dL Urobilinogen: 0.2 mg/dL Casts: Hyaline    Nitrites: Neg Trichomonas: Not Present    Leukocyte Esterase: 1+ leu/uL Mucous: Not Present      Epithelial Cells: 0 - 5/hpf      Yeast: NS (Not Seen)      Sperm: Not Present    ASSESSMENT/PLAN:     ICD-10 Details  1 GU:   Gross hematuria - R31.0 Her gross hematuria is clearly secondary to the lesions noted within her bladder at the time of cystoscopy  2   Bladder tumor/neoplasm - D41.4 She has at least 2 large bladder tumors involving the right wall of her bladder as well as multiple areas of papillary abnormality involving the dome and posterior wall the bladder as well.  3 NON-GU:   Pyuria/other UA findings - R82.79 She had some white blood cells in her urine and a moderate amount of bacteria so her urine will be cultured in preparation for her surgery.          Notes:   I went over the results of the CT scan as well as my cystoscopic findings today which have revealed a bladder mass consistent with transitional cell carcinoma. We discussed the fact that currently there is no evidence of extravesical extension or pelvic adenopathy based on CT scan findings. Further characterization of the lesion is required for  grading and staging purposes. We discussed proceeding with evaluation using transurethral resection of the lesion. I have discussed the procedure in detail as well as the potential risks and complications associated with this form of surgery. We also discussed the probability of successful resection of the intravesical portion of this lesion. I have recommended, as long as there is no contraindication at the time of surgery, the placement of intravesical mitomycin-C in order to reduce the risk of recurrence. We did discuss the potential side effects of this form of intravesical chemotherapy. The procedure will be performed under anesthesia as an outpatient.

## 2018-04-29 NOTE — Discharge Instructions (Signed)

## 2018-04-30 ENCOUNTER — Ambulatory Visit (HOSPITAL_COMMUNITY): Payer: Medicare Other | Admitting: Anesthesiology

## 2018-04-30 ENCOUNTER — Encounter (HOSPITAL_COMMUNITY): Payer: Self-pay | Admitting: *Deleted

## 2018-04-30 ENCOUNTER — Encounter (HOSPITAL_COMMUNITY)
Admission: RE | Disposition: A | Discharge status: Home / Self Care | Payer: Self-pay | Source: Ambulatory Visit | Attending: Urology

## 2018-04-30 ENCOUNTER — Other Ambulatory Visit: Payer: Self-pay

## 2018-04-30 ENCOUNTER — Ambulatory Visit (INDEPENDENT_AMBULATORY_CARE_PROVIDER_SITE_OTHER): Payer: Medicare Other | Admitting: Otolaryngology

## 2018-04-30 ENCOUNTER — Observation Stay (HOSPITAL_COMMUNITY): Payer: Medicare Other

## 2018-04-30 ENCOUNTER — Observation Stay (HOSPITAL_COMMUNITY)
Admission: RE | Admit: 2018-04-30 | Discharge: 2018-05-01 | Disposition: A | Discharge status: Home / Self Care | Payer: Medicare Other | Source: Ambulatory Visit | Attending: Urology | Admitting: Urology

## 2018-04-30 DIAGNOSIS — K219 Gastro-esophageal reflux disease without esophagitis: Secondary | ICD-10-CM | POA: Diagnosis not present

## 2018-04-30 DIAGNOSIS — D509 Iron deficiency anemia, unspecified: Secondary | ICD-10-CM | POA: Diagnosis not present

## 2018-04-30 DIAGNOSIS — Z888 Allergy status to other drugs, medicaments and biological substances status: Secondary | ICD-10-CM | POA: Diagnosis not present

## 2018-04-30 DIAGNOSIS — Z91041 Radiographic dye allergy status: Secondary | ICD-10-CM | POA: Diagnosis not present

## 2018-04-30 DIAGNOSIS — M1990 Unspecified osteoarthritis, unspecified site: Secondary | ICD-10-CM | POA: Diagnosis not present

## 2018-04-30 DIAGNOSIS — Z91013 Allergy to seafood: Secondary | ICD-10-CM | POA: Diagnosis not present

## 2018-04-30 DIAGNOSIS — I119 Hypertensive heart disease without heart failure: Secondary | ICD-10-CM | POA: Diagnosis not present

## 2018-04-30 DIAGNOSIS — J9811 Atelectasis: Secondary | ICD-10-CM | POA: Diagnosis not present

## 2018-04-30 DIAGNOSIS — G4730 Sleep apnea, unspecified: Secondary | ICD-10-CM | POA: Diagnosis not present

## 2018-04-30 DIAGNOSIS — Z79899 Other long term (current) drug therapy: Secondary | ICD-10-CM | POA: Insufficient documentation

## 2018-04-30 DIAGNOSIS — C674 Malignant neoplasm of posterior wall of bladder: Secondary | ICD-10-CM | POA: Diagnosis not present

## 2018-04-30 DIAGNOSIS — Z8711 Personal history of peptic ulcer disease: Secondary | ICD-10-CM | POA: Diagnosis not present

## 2018-04-30 DIAGNOSIS — R011 Cardiac murmur, unspecified: Secondary | ICD-10-CM | POA: Diagnosis not present

## 2018-04-30 DIAGNOSIS — D494 Neoplasm of unspecified behavior of bladder: Secondary | ICD-10-CM

## 2018-04-30 DIAGNOSIS — I351 Nonrheumatic aortic (valve) insufficiency: Secondary | ICD-10-CM | POA: Diagnosis not present

## 2018-04-30 DIAGNOSIS — Z7982 Long term (current) use of aspirin: Secondary | ICD-10-CM | POA: Insufficient documentation

## 2018-04-30 DIAGNOSIS — E785 Hyperlipidemia, unspecified: Secondary | ICD-10-CM | POA: Diagnosis present

## 2018-04-30 DIAGNOSIS — R0902 Hypoxemia: Secondary | ICD-10-CM | POA: Diagnosis present

## 2018-04-30 DIAGNOSIS — C672 Malignant neoplasm of lateral wall of bladder: Secondary | ICD-10-CM | POA: Diagnosis present

## 2018-04-30 DIAGNOSIS — I10 Essential (primary) hypertension: Secondary | ICD-10-CM | POA: Diagnosis not present

## 2018-04-30 DIAGNOSIS — I2510 Atherosclerotic heart disease of native coronary artery without angina pectoris: Secondary | ICD-10-CM | POA: Diagnosis not present

## 2018-04-30 DIAGNOSIS — I252 Old myocardial infarction: Secondary | ICD-10-CM | POA: Diagnosis not present

## 2018-04-30 DIAGNOSIS — Z87891 Personal history of nicotine dependence: Secondary | ICD-10-CM | POA: Insufficient documentation

## 2018-04-30 DIAGNOSIS — C679 Malignant neoplasm of bladder, unspecified: Secondary | ICD-10-CM | POA: Diagnosis not present

## 2018-04-30 HISTORY — PX: TRANSURETHRAL RESECTION OF BLADDER TUMOR: SHX2575

## 2018-04-30 LAB — CBC WITH DIFFERENTIAL/PLATELET
Abs Immature Granulocytes: 0.02 10*3/uL (ref 0.00–0.07)
Basophils Absolute: 0 10*3/uL (ref 0.0–0.1)
Basophils Relative: 0 %
Eosinophils Absolute: 0 10*3/uL (ref 0.0–0.5)
Eosinophils Relative: 0 %
HCT: 45.7 % (ref 36.0–46.0)
Hemoglobin: 14.6 g/dL (ref 12.0–15.0)
Immature Granulocytes: 0 %
Lymphocytes Relative: 6 %
Lymphs Abs: 0.4 10*3/uL — ABNORMAL LOW (ref 0.7–4.0)
MCH: 35 pg — ABNORMAL HIGH (ref 26.0–34.0)
MCHC: 31.9 g/dL (ref 30.0–36.0)
MCV: 109.6 fL — ABNORMAL HIGH (ref 80.0–100.0)
Monocytes Absolute: 0.1 10*3/uL (ref 0.1–1.0)
Monocytes Relative: 1 %
Neutro Abs: 5.4 10*3/uL (ref 1.7–7.7)
Neutrophils Relative %: 93 %
Platelets: 162 10*3/uL (ref 150–400)
RBC: 4.17 MIL/uL (ref 3.87–5.11)
RDW: 13.4 % (ref 11.5–15.5)
WBC: 5.9 10*3/uL (ref 4.0–10.5)
nRBC: 0 % (ref 0.0–0.2)

## 2018-04-30 LAB — COMPREHENSIVE METABOLIC PANEL
ALT: 14 U/L (ref 0–44)
AST: 18 U/L (ref 15–41)
Albumin: 3.4 g/dL — ABNORMAL LOW (ref 3.5–5.0)
Alkaline Phosphatase: 48 U/L (ref 38–126)
Anion gap: 5 (ref 5–15)
BUN: 19 mg/dL (ref 8–23)
CALCIUM: 9 mg/dL (ref 8.9–10.3)
CHLORIDE: 106 mmol/L (ref 98–111)
CO2: 31 mmol/L (ref 22–32)
CREATININE: 0.57 mg/dL (ref 0.44–1.00)
Glucose, Bld: 134 mg/dL — ABNORMAL HIGH (ref 70–99)
Potassium: 4.6 mmol/L (ref 3.5–5.1)
SODIUM: 142 mmol/L (ref 135–145)
Total Bilirubin: 1.1 mg/dL (ref 0.3–1.2)
Total Protein: 6.1 g/dL — ABNORMAL LOW (ref 6.5–8.1)

## 2018-04-30 LAB — MAGNESIUM: MAGNESIUM: 1.9 mg/dL (ref 1.7–2.4)

## 2018-04-30 LAB — PHOSPHORUS: Phosphorus: 4 mg/dL (ref 2.5–4.6)

## 2018-04-30 LAB — TROPONIN I: Troponin I: <0.03 ng/mL (ref <0.03)

## 2018-04-30 SURGERY — TURBT (TRANSURETHRAL RESECTION OF BLADDER TUMOR)
Anesthesia: General

## 2018-04-30 MED ORDER — APOAEQUORIN 10 MG PO CAPS: ORAL_CAPSULE | Freq: Every day | ORAL | Status: DC

## 2018-04-30 MED ORDER — FENTANYL CITRATE (PF) 100 MCG/2ML IJ SOLN
INTRAMUSCULAR | Status: DC | PRN
  Administered 2018-04-30: 50 ug via INTRAVENOUS
  Administered 2018-04-30 (×2): 25 ug via INTRAVENOUS

## 2018-04-30 MED ORDER — MECLIZINE HCL 25 MG PO TABS: 25.0000 mg | ORAL_TABLET | Freq: Every day | ORAL | Status: DC | PRN

## 2018-04-30 MED ORDER — PROPOFOL 10 MG/ML IV BOLUS
INTRAVENOUS | Status: AC
  Filled 2018-04-30: qty 20

## 2018-04-30 MED ORDER — ENSURE ENLIVE PO LIQD
237.0000 mL | Freq: Two times a day (BID) | ORAL | Status: DC
  Administered 2018-04-30: 237 mL via ORAL

## 2018-04-30 MED ORDER — PHENAZOPYRIDINE HCL 200 MG PO TABS: 200.0000 mg | ORAL_TABLET | Freq: Three times a day (TID) | ORAL | 0 refills | Status: DC | PRN

## 2018-04-30 MED ORDER — ONDANSETRON HCL 4 MG/2ML IJ SOLN: 4.0000 mg | Freq: Once | INTRAMUSCULAR | Status: DC | PRN

## 2018-04-30 MED ORDER — ONDANSETRON HCL 4 MG/2ML IJ SOLN: 4.0000 mg | INTRAMUSCULAR | Status: DC | PRN

## 2018-04-30 MED ORDER — BELLADONNA ALKALOIDS-OPIUM 16.2-60 MG RE SUPP
1.0000 | Freq: Four times a day (QID) | RECTAL | Status: DC | PRN
  Administered 2018-05-01: 1 via RECTAL
  Filled 2018-04-30: qty 1

## 2018-04-30 MED ORDER — ATORVASTATIN CALCIUM 10 MG PO TABS: 10.0000 mg | ORAL_TABLET | Freq: Every morning | ORAL | Status: DC

## 2018-04-30 MED ORDER — LIDOCAINE HCL (CARDIAC) PF 100 MG/5ML IV SOSY
PREFILLED_SYRINGE | INTRAVENOUS | Status: DC | PRN
  Administered 2018-04-30: 100 mg via INTRAVENOUS

## 2018-04-30 MED ORDER — CIPROFLOXACIN IN D5W 400 MG/200ML IV SOLN
400.0000 mg | Freq: Once | INTRAVENOUS | Status: AC
  Administered 2018-04-30: 400 mg via INTRAVENOUS
  Filled 2018-04-30: qty 200

## 2018-04-30 MED ORDER — SODIUM CHLORIDE 0.9 % IR SOLN
Status: DC | PRN
  Administered 2018-04-30: 15000 mL via INTRAVESICAL

## 2018-04-30 MED ORDER — MEPERIDINE HCL 50 MG/ML IJ SOLN: 6.2500 mg | INTRAMUSCULAR | Status: DC | PRN

## 2018-04-30 MED ORDER — TRAMADOL HCL 50 MG PO TABS: 50.0000 mg | ORAL_TABLET | Freq: Four times a day (QID) | ORAL | 0 refills | Status: DC | PRN

## 2018-04-30 MED ORDER — FENTANYL CITRATE (PF) 100 MCG/2ML IJ SOLN
25.0000 ug | INTRAMUSCULAR | Status: DC | PRN
  Administered 2018-04-30: 25 ug via INTRAVENOUS

## 2018-04-30 MED ORDER — ONDANSETRON HCL 4 MG/2ML IJ SOLN
INTRAMUSCULAR | Status: DC | PRN
  Administered 2018-04-30: 4 mg via INTRAVENOUS

## 2018-04-30 MED ORDER — OXYCODONE HCL 5 MG PO TABS: 5.0000 mg | ORAL_TABLET | ORAL | Status: DC | PRN

## 2018-04-30 MED ORDER — DEXAMETHASONE SODIUM PHOSPHATE 10 MG/ML IJ SOLN
INTRAMUSCULAR | Status: DC | PRN
  Administered 2018-04-30: 10 mg via INTRAVENOUS

## 2018-04-30 MED ORDER — ACETAMINOPHEN 325 MG PO TABS: 650.0000 mg | ORAL_TABLET | ORAL | Status: DC | PRN

## 2018-04-30 MED ORDER — FENTANYL CITRATE (PF) 100 MCG/2ML IJ SOLN
INTRAMUSCULAR | Status: AC
  Filled 2018-04-30: qty 2

## 2018-04-30 MED ORDER — SODIUM CHLORIDE 0.9 % IV SOLN
INTRAVENOUS | Status: DC
  Administered 2018-04-30: 15:00:00 via INTRAVENOUS

## 2018-04-30 MED ORDER — ETOMIDATE 2 MG/ML IV SOLN
INTRAVENOUS | Status: AC
  Filled 2018-04-30: qty 10

## 2018-04-30 MED ORDER — PANTOPRAZOLE SODIUM 40 MG PO TBEC
40.0000 mg | DELAYED_RELEASE_TABLET | Freq: Every day | ORAL | Status: DC
  Administered 2018-04-30: 40 mg via ORAL
  Filled 2018-04-30: qty 1

## 2018-04-30 MED ORDER — ETOMIDATE 2 MG/ML IV SOLN
INTRAVENOUS | Status: DC | PRN
  Administered 2018-04-30: 10 mg via INTRAVENOUS

## 2018-04-30 MED ORDER — CALCIUM CARBONATE-VITAMIN D 500-200 MG-UNIT PO TABS
1.0000 | ORAL_TABLET | Freq: Two times a day (BID) | ORAL | Status: DC
  Administered 2018-04-30: 1 via ORAL
  Filled 2018-04-30: qty 1

## 2018-04-30 MED ORDER — LACTATED RINGERS IV SOLN
INTRAVENOUS | Status: DC
  Administered 2018-04-30: 08:00:00 via INTRAVENOUS

## 2018-04-30 MED ORDER — AMLODIPINE BESYLATE 5 MG PO TABS: 5.0000 mg | ORAL_TABLET | Freq: Every morning | ORAL | Status: DC

## 2018-04-30 MED ORDER — 0.9 % SODIUM CHLORIDE (POUR BTL) OPTIME
TOPICAL | Status: DC | PRN
  Administered 2018-04-30: 1000 mL

## 2018-04-30 MED ORDER — ISOSORBIDE MONONITRATE ER 30 MG PO TB24: 30.0000 mg | ORAL_TABLET | Freq: Every morning | ORAL | Status: DC

## 2018-04-30 SURGICAL SUPPLY — 18 items
BAG URINE DRAINAGE (UROLOGICAL SUPPLIES) ×2 IMPLANT
BAG URO CATCHER STRL LF (MISCELLANEOUS) ×2 IMPLANT
CATH FOLEY 2WAY SLVR  5CC 20FR (CATHETERS) ×1
CATH FOLEY 2WAY SLVR 5CC 20FR (CATHETERS) ×1 IMPLANT
COVER WAND RF STERILE (DRAPES) IMPLANT
ELECT REM PT RETURN 15FT ADLT (MISCELLANEOUS) IMPLANT
EVACUATOR MICROVAS BLADDER (UROLOGICAL SUPPLIES) ×2 IMPLANT
GLOVE BIOGEL M 8.0 STRL (GLOVE) ×2 IMPLANT
GOWN STRL REUS W/TWL XL LVL3 (GOWN DISPOSABLE) ×2 IMPLANT
HOLDER FOLEY CATH W/STRAP (MISCELLANEOUS) ×2 IMPLANT
LOOP CUT BIPOLAR 24F LRG (ELECTROSURGICAL) ×2 IMPLANT
MANIFOLD NEPTUNE II (INSTRUMENTS) ×2 IMPLANT
PACK CYSTO (CUSTOM PROCEDURE TRAY) ×2 IMPLANT
SET ASPIRATION TUBING (TUBING) ×2 IMPLANT
SYRINGE IRR TOOMEY STRL 70CC (SYRINGE) IMPLANT
TUBING CONNECTING 10 (TUBING) ×2 IMPLANT
TUBING UROLOGY SET (TUBING) ×2 IMPLANT
WATER STERILE IRR 250ML POUR (IV SOLUTION) ×2 IMPLANT

## 2018-04-30 NOTE — Progress Notes (Signed)
PACU note----Dr. Lissa Hoard wants patient to be observation overnight in hospital due to low SAO2 levels off oxygen and shallow respirations; call placed to Dr. Karsten Ro who will contact hospitalist to admit patient; pt's husband made aware

## 2018-04-30 NOTE — Progress Notes (Signed)
PHARMACIST - PHYSICIAN ORDER COMMUNICATION  CONCERNING: P&T Medication Policy on Herbal Medications  DESCRIPTION:  This patient's order for:  Prevagen has been noted.  This product(s) is classified as an "herbal" or natural product. Due to a lack of definitive safety studies or FDA approval, nonstandard manufacturing practices, plus the potential risk of unknown drug-drug interactions while on inpatient medications, the Pharmacy and Therapeutics Committee does not permit the use of "herbal" or natural products of this type within [facility]   ACTION TAKEN: The pharmacy department is unable to verify this order at this time and your patient has been informed of this safety policy. Please reevaluate patient's clinical condition at discharge and address if the herbal or natural product(s) should be resumed at that time.  Netta Cedars, PharmD, BCPS 04/30/2018@6 :23 PM

## 2018-04-30 NOTE — Op Note (Signed)
PATIENT:  Victoria Short  PRE-OPERATIVE DIAGNOSIS: Bladder tumor  POST-OPERATIVE DIAGNOSIS: Same  PROCEDURE:  Procedure(s): TRANSURETHRAL RESECTION OF BLADDER TUMOR (TURBT) (3 cm.)  SURGEON:  Surgeon(s): Claybon Jabs  ANESTHESIA:   General  EBL:  less than 50 mL  DRAINS: Urethral catheter (20 Fr. Foley)   SPECIMEN:  Bladder tumor  DISPOSITION OF SPECIMEN:  PATHOLOGY  Indication: Ms. Arriaga is a 84 year old female who initially experienced gross hematuria in 5/19.  She underwent a CT scan that revealed no abnormality of the upper tract and there were also no obvious enlarged lymph nodes.  It did however reveal lesions on the right wall of the bladder that were confirmed cystoscopically to be bladder tumors.  She is brought to the operating room today for transurethral resection of her bladder tumors.  Description of operation: The patient was taken to the operating room and administered general anesthesia. They were then placed on the table and moved to the dorsal lithotomy position after which the genitalia was sterilely prepped and draped. An official timeout was then performed.  The 52 French resectoscope with the 30 lens and visual obturator were then passed into the bladder under direct visualization. Urethra appeared normal. The visual obturator was then removed and the Gyrus resectoscope element with 30  lens was then inserted and the bladder was fully and systematically inspected. Ureteral orifices were noted to be in the normal anatomic positions.  I noted a large nodular tumor involving the right wall of the bladder but well away from the ureteral orifice.  A second large tumor was noted on the posterior superior wall extending on up to the dome with papillary appearing mucosa surrounding the more posterior tumor.  I first began by resecting the right wall tumor.  It was solid rather than papillary.  The same was the case for the second tumor that was located more posterior  and superiorly.  These were resected down to the detrusor.  There was some papillary appearing mucosa medial to the superior tumor that was fulgurated.  Reinspection of the bladder revealed all obvious tumor had been fully resected and there was no evidence of perforation. The Microvasive evacuator was then used to irrigate the bladder and remove all of the portions of bladder tumor which were sent to pathology. I then removed the resectoscope.  A 20 French Foley catheter was then inserted in the bladder and irrigated. The irrigant returned clear with no clots. The patient was awakened and taken to the recovery room.  Due to the fact that the tumor appeared to be solid and likely muscle invasive based on the appearance intraoperatively as well as the fact that there was some thinning of the bladder wall but no overt perforation I elected to leave the Foley catheter but did not elect to instill intravesical chemotherapy at this time.  PLAN OF CARE: Discharge to home after PACU  PATIENT DISPOSITION:  PACU - hemodynamically stable.

## 2018-04-30 NOTE — Anesthesia Procedure Notes (Signed)
Procedure Name: LMA Insertion Date/Time: 04/30/2018 9:49 AM Performed by: Lavina Hamman, CRNA Pre-anesthesia Checklist: Patient identified, Emergency Drugs available, Suction available, Patient being monitored and Timeout performed Patient Re-evaluated:Patient Re-evaluated prior to induction Oxygen Delivery Method: Circle system utilized Preoxygenation: Pre-oxygenation with 100% oxygen Induction Type: IV induction Ventilation: Mask ventilation without difficulty LMA: LMA inserted Tube size: 4.0 mm Number of attempts: 1 Placement Confirmation: positive ETCO2 and breath sounds checked- equal and bilateral Tube secured with: Tape Dental Injury: Teeth and Oropharynx as per pre-operative assessment

## 2018-04-30 NOTE — Anesthesia Preprocedure Evaluation (Addendum)
Anesthesia Evaluation  Patient identified by MRN, date of birth, ID band Patient awake    Reviewed: Allergy & Precautions, NPO status , Patient's Chart, lab work & pertinent test results  Airway Mallampati: II  TM Distance: >3 FB Neck ROM: Full    Dental no notable dental hx.    Pulmonary shortness of breath, sleep apnea , pneumonia, former smoker,    Pulmonary exam normal breath sounds clear to auscultation       Cardiovascular hypertension, Pt. on medications + CAD, + Past MI and + DOE  + Valvular Problems/Murmurs  Rhythm:Regular Rate:Normal + Systolic murmurs Echo 5852 - Left ventricle: The cavity size was normal. Wall thickness was at the upper limits of normal with basal septal hypertrophy. There is systolic anterior motion of the mitral apparatus. Systolic function was vigorous. The estimated ejection fraction was in the range of 65% to 70%. Wall motion was normal; there were no regional wall motion abnormalities. Doppler parameters are consistent with abnormal left ventricular relaxation (grade 1 diastolic dysfunction). Doppler parameters are consistent with high ventricular filling pressure. - Aortic valve: Mildly calcified annulus. Trileaflet; mildly   calcified leaflets. There was mild regurgitation. - Mitral valve: Mildly thickened, mildly calcified leaflets . There  was trivial regurgitation. - Left atrium: The atrium was mildly dilated. - Right atrium: Central venous pressure (est): 3 mm Hg. - Atrial septum: There was increased thickness of the septum, consistent with lipomatous hypertrophy. - Tricuspid valve: There was trivial regurgitation. - Pulmonary arteries: PA peak pressure: 35 mm Hg (S). - Pericardium, extracardiac: There was no pericardial effusion.  Impressions: - Upper normal LV wall thickness with significant basal septal hypertrophy, LVEF 65-70%. Systolic anterior motion of the mitral apparatus noted. Grade  1 diastolic dysfunction with increased filling pressures. Mild left atrial enlargement. Mildly thickened  and calcified mitral valve with trivial mitral regurgitation.  Mildly sclerotic aortic valve with mild aortic regurgitation. Trivial tricuspid regurgitation with PASP 35 mmHg. Lipomatous  hypertrophy of the interatrial septum.   Neuro/Psych  Neuromuscular disease negative psych ROS   GI/Hepatic Neg liver ROS, hiatal hernia, PUD, GERD  ,  Endo/Other  negative endocrine ROS  Renal/GU negative Renal ROS     Musculoskeletal  (+) Arthritis ,   Abdominal   Peds  Hematology  (+) Blood dyscrasia, anemia ,   Anesthesia Other Findings   Reproductive/Obstetrics negative OB ROS                            Anesthesia Physical Anesthesia Plan  ASA: IV  Anesthesia Plan: General   Post-op Pain Management:    Induction: Intravenous  PONV Risk Score and Plan: 4 or greater and Ondansetron, Dexamethasone and Treatment may vary due to age or medical condition  Airway Management Planned: LMA  Additional Equipment:   Intra-op Plan:   Post-operative Plan: Extubation in OR  Informed Consent: I have reviewed the patients History and Physical, chart, labs and discussed the procedure including the risks, benefits and alternatives for the proposed anesthesia with the patient or authorized representative who has indicated his/her understanding and acceptance.   Dental advisory given  Plan Discussed with: CRNA  Anesthesia Plan Comments:         Anesthesia Quick Evaluation

## 2018-04-30 NOTE — Progress Notes (Signed)
Patient ID: Victoria Short, female   DOB: 02-Feb-1925, 84 y.o.   MRN: [id number] She did well during her surgery.  I was able to resect all of the lesions in her bladder and left a catheter in place.  In the PACU she was looking good but kept dropping her oxygen saturation into the 70s.  The anesthesiologist was concerned and felt admission was warranted for observation overnight.  I agreed and admitted the patient.  She has been seen by the hospitalist.  I spoke to her and her husband and he indicates that she has a history of having low oxygen saturation levels in the 80s.  When she was seen the evening of her surgery she appeared to be doing well with no complaints and no pulmonary difficulties.  Her chest x-ray was essentially clear and her lab work including troponin levels had come back normal.    She will be observed overnight on oxygen and likely be discharged in the morning.

## 2018-04-30 NOTE — Consult Note (Signed)
Medical Consultation   Victoria Short  UVO:[id number]  DOB: 1925-06-22  DOA: 04/30/2018  PCP: Curlene Labrum, MD   Outpatient Specialists:    Requesting physician: Dr Nigel Sloop.  Reason for consultation: Hypoxia.  History of Present Illness: Victoria Short is an 84 y.o. female with a past medical history of arthritis of the hand, benign neoplasm of pituitary gland, bladder tumor, borderline diabetes mellitus, coronary artery disease with history of cardiogenic shock requiring pressor therapy in 2012, carotid artery disease, constipation who underwent urinary bladder tumor resections earlier today, but was hypoxic in the recovery room.  She is currently in her room in no acute distress.  Her husband stated that they have a home pulse ox device and she frequently has an O2 sat in the 70s and 80s at home.  It usually goes away after she takes a few deep breaths.  She states she feels better.  She denies fever, headache, sore throat, wheezing, dyspnea, chest pain, palpitations, dyspnea, PND, orthopnea, but occasionally gets lower extremity edema.  Denies nausea, emesis, abdominal pain, diarrhea, but states she is frequently constipated.  Complains of dysuria.  Denies skin pruritus.  No polyuria, polydipsia, polyphagia or blurred vision.  Review of Systems:  ROS As per HPI otherwise 10 point review of systems negative.   Past Medical History: Past Medical History:  Diagnosis Date  . Arthritis    hands  . Benign neoplasm of pituitary gland (HCC)    per last mri 01-30-2015  slight decrease macro adenoma  . Bladder tumor   . Borderline diabetes mellitus   . Cardiogenic shock ([place])    a. 05/2011 requiring pressor therapy.  . Carotid artery disease ([place])    per last duplex in epic 05-06-2015   bilateral ICA 1-39%  . Constipation   . Coronary artery disease cardiologist-  dr Bronson Ing   a. NSTEMI 05/2011 s/p DES to LAD, with ?Takotsubo distribution of cardiomyopathy at  that time with cardiogenic shock. b. NSTEMI 06/2011 ?spasm - stable CAD, normal EF. c. Patent LAD stent, nonobst CAD by cath 08/20/12 with normal EF. d. Hx dyspnea with Brilinta.  . DOE (dyspnea on exertion)   . Dyspnea    with exertion and also noted some at rest  . Erosive gastritis   . Fatigue   . GERD (gastroesophageal reflux disease)   . Heart murmur   . Hematuria    passes blood clots occ  . Hemorrhoids   . Hiatal hernia   . History of gastritis 05/2014   w/ erosions  . History of GI bleed 05/2015   s/p  EGD with ablation duodenal polyp and gastric polypectomy with post gi bleed , s/p clipping  . History of non-ST elevation myocardial infarction (NSTEMI) 05/11/2011   w/ cardiogenic shock requiring pressor therapy,  and s/p  PCI and DES to LAD  . History of skin cancer   . HOH (hard of hearing)    bilateral wears hearing aides  . Hyperlipidemia   . Hypertension   . Hypertrophic cardiomyopathy ([place])    a. echo 08/2012: focal basal septal hypertrophy, mildly elevated gradient 55mmHg across LVOT.  . Iron deficiency anemia due to chronic blood loss    hemotologist --- dr Lauree Chandler @ [facility] office in [place],, hx IV iron infusions  . Melanoma ([place])    nose  . Mild obstructive sleep apnea    per pt no cpap recommendation  .  Myocardial infarction ([place])   . Nasal polyp   . Neuropathy, peripheral   . Nocturia more than twice per night   . OAB (overactive bladder)   . Osteoporosis   . Pneumonia    childhood  . Polyp of stomach and duodenum   . Poor circulation    lower ext bil  . Pre-diabetes   . S/P drug eluting coronary stent placement 05/12/2011   to mid LAD  . Scoliosis   . Stress-induced cardiomyopathy 05/2011   a. ?Takotubso cardiomyopathy - distribution was different than that of LAD stenosis.  Marland Kitchen Unsteady gait   . Urinary incontinence   . Vertigo, intermittent   . Vitamin D deficiency   . Wears hearing aid in both ears     Past Surgical History: Past  Surgical History:  Procedure Laterality Date  . APPENDECTOMY  1953  . CARDIAC CATHETERIZATION  06/22/2011   patent LAD stent. Normal EF  . CARDIAC CATHETERIZATION  08/2012   Patent LAD stent with normal ejection fraction.  Marland Kitchen CARDIAC CATHETERIZATION N/A 01/19/2015   Procedure: Right/Left Heart Cath and Coronary Angiography;  Surgeon: Jettie Booze, MD;  Location: [place] [facility];  Service: Cardiovascular;  Laterality: N/A;  . CARDIOVASCULAR STRESS TEST  11-26-2014   dr Bronson Ing   Low risk nuclear study w/ no ischemia or infarct/  normal LV function and wall motion , ef 72%  . CATARACT EXTRACTION W/ INTRAOCULAR LENS  IMPLANT, BILATERAL  2009  approx.  . COLONOSCOPY    . CORONARY ANGIOPLASTY WITH STENT PLACEMENT  05-12-2011  dr Shelva Majestic   mid LAD: 2.5 X 16 mm Promus DES for 85-90% stenosis/  pCFx 30-40%, diagonal 20%, ef 40-45%/ ascending aorta stenosis 63mm gradient/  hypocontratility involve distal anterolateral apical and distal inferior wall  . ESOPHAGOGASTRODUODENOSCOPY N/A 05/23/2014   Procedure: ESOPHAGOGASTRODUODENOSCOPY (EGD);  Surgeon: Rogene Houston, MD;  [facility];  Service: Endoscopy;  Laterality: N/A;  230 - moved to 1:05 - Ann notified pt  . ESOPHAGOGASTRODUODENOSCOPY N/A 05/20/2015   Procedure: ESOPHAGOGASTRODUODENOSCOPY (EGD);  Surgeon: Rogene Houston, MD;  [facility];  Service: Endoscopy;  Laterality: N/A;  1:00  . ESOPHAGOGASTRODUODENOSCOPY N/A 05/28/2015   Procedure: ESOPHAGOGASTRODUODENOSCOPY (EGD);  Surgeon: Rogene Houston, MD;  [facility];  Service: Endoscopy;  Laterality: N/A;  . GIVENS CAPSULE STUDY N/A 05/12/2014   Procedure: GIVENS CAPSULE STUDY;  Surgeon: Rogene Houston, MD;  [facility];  Service: Endoscopy;  Laterality: N/A;  730  . LEFT HEART CATHETERIZATION WITH CORONARY ANGIOGRAM N/A 05/12/2011   Procedure: LEFT HEART CATHETERIZATION WITH CORONARY ANGIOGRAM;  Surgeon: Troy Sine, MD;   Location: [facility] CATH LAB;  Service: Cardiovascular;  Laterality: N/A;  . LEFT HEART CATHETERIZATION WITH CORONARY ANGIOGRAM N/A 06/22/2011   Procedure: LEFT HEART CATHETERIZATION WITH CORONARY ANGIOGRAM;  Surgeon: Wellington Hampshire, MD;  Location: [place] CATH LAB;  Service: Cardiovascular;  Laterality: N/A;  . LEFT HEART CATHETERIZATION WITH CORONARY ANGIOGRAM N/A 08/20/2012   Procedure: LEFT HEART CATHETERIZATION WITH CORONARY ANGIOGRAM;  Surgeon: Burnell Blanks, MD;  Location: [facility] CATH LAB;  Service: Cardiovascular;  Laterality: N/A;  . PARTIAL OOPHORECTOMY Right 1955   BENIGN  . PERCUTANEOUS CORONARY STENT INTERVENTION (PCI-S)  05/12/2011   Procedure: PERCUTANEOUS CORONARY STENT INTERVENTION (PCI-S);  Surgeon: Troy Sine, MD;  Location: [facility] CATH LAB;  Service: Cardiovascular;;  . POLYPECTOMY N/A 05/20/2015   Procedure: EGD with polypectomy;  Surgeon: Rogene Houston, MD;  [facility];  Service: Endoscopy;  Laterality: N/A;  . TRANSTHORACIC ECHOCARDIOGRAM  10-09-2017   dr Bronson Ing   ef 55-60%, moderate conentric LVH, grade 2 diastolic dysfunction/  basal septal hypertrophy with systolic anterior motion of the mitral valve outflow track peak grandient greater than 12mmHg with valsalva/  mild AR, MR, and TR  . WISDOM TOOTH EXTRACTION       Allergies:   Allergies  Allergen Reactions  . Brilinta [Ticagrelor] Shortness Of Breath, Nausea And Vomiting and Other (See Comments)    dyspnea  . Iodine Nausea And Vomiting and Other (See Comments)    AND DIZZY  . Shellfish Allergy Nausea And Vomiting and Other (See Comments)    Unsteady gait/  PER PT ONLY SHRIMP  . Streptomycin Nausea And Vomiting  . Iodinated Diagnostic Agents Nausea Only     Social History:  reports that she quit smoking about 38 years ago. Her smoking use included cigarettes. She has a 12.00 pack-year smoking history. She has never used smokeless tobacco. She reports that she does not drink alcohol or use  drugs.   Family History: Family History  Problem Relation Age of Onset  . Heart attack Mother 76  . Diabetic kidney disease Father 12  . Prostate cancer Father    Physical Exam: Vitals:   04/30/18 1330 04/30/18 1345 04/30/18 1400 04/30/18 1432  BP: 136/61 (!) 132/53 (!) 144/64 (!) 143/62  Pulse: 60 (!) 59 62 66  Resp: (!) 8 19 11 16   Temp:   98 F (36.7 C) 98.5 F (36.9 C)  TempSrc:    Oral  SpO2: 97% 97% 95% 91%  Weight:      Height:        Constitutional: Alert and awake, oriented x3, not in any acute distress. Eyes: PERLA, EOMI, irises appear normal, anicteric sclera,  ENMT: external ears and nose appear normal, normal hearing             Lips appears normal, oropharynx mucosa, tongue, posterior pharynx appear normal  Neck: neck appears normal, no masses, normal ROM, no thyromegaly, no JVD  CVS: S1-S2 clear, no murmur rubs or gallops, no LE edema, normal pedal pulses  Respiratory: Decreased breath sounds on bases, otherwise clear to auscultation bilaterally, no wheezing, rales or rhonchi. Respiratory effort normal. No accessory muscle use.  Abdomen: soft nontender, nondistended, normal bowel sounds, no hepatosplenomegaly, no hernias  Musculoskeletal: : no cyanosis, clubbing or edema noted bilaterally Neuro: Cranial nerves II-XII intact, strength, sensation, reflexes Psych: judgement and insight appear normal, stable mood and affect, mental status   Data reviewed:  I have personally reviewed following labs and imaging studies Labs:  CBC: Recent Labs  Lab 04/24/18 1140  WBC 4.7  HGB 14.5  HCT 45.7  MCV 108.6*  PLT 409    Basic Metabolic Panel: Recent Labs  Lab 04/24/18 1140 04/30/18 1325  NA 146* 142  K 3.9 4.6  CL 105 106  CO2 35* 31  GLUCOSE 114* 134*  BUN 20 19  CREATININE 0.71 0.57  CALCIUM 9.3 9.0  MG  --  1.9  PHOS  --  4.0   GFR Estimated Creatinine Clearance: 41.3 mL/min (by C-G formula based on SCr of 0.57 mg/dL). Liver Function  Tests: Recent Labs  Lab 04/30/18 1325  AST 18  ALT 14  ALKPHOS 48  BILITOT 1.1  PROT 6.1*  ALBUMIN 3.4*   No results for input(s): LIPASE, AMYLASE in the last 168 hours. No results for input(s):  AMMONIA in the last 168 hours. Coagulation profile No results for input(s): INR, PROTIME in the last 168 hours.  Cardiac Enzymes: Recent Labs  Lab 04/30/18 1325  TROPONINI <0.03   BNP: Invalid input(s): POCBNP CBG: No results for input(s): GLUCAP in the last 168 hours. D-Dimer No results for input(s): DDIMER in the last 72 hours. Hgb A1c No results for input(s): HGBA1C in the last 72 hours. Lipid Profile No results for input(s): CHOL, HDL, LDLCALC, TRIG, CHOLHDL, LDLDIRECT in the last 72 hours. Thyroid function studies No results for input(s): TSH, T4TOTAL, T3FREE, THYROIDAB in the last 72 hours.  Invalid input(s): FREET3 Anemia work up No results for input(s): VITAMINB12, FOLATE, FERRITIN, TIBC, IRON, RETICCTPCT in the last 72 hours. Urinalysis No results found for: COLORURINE, APPEARANCEUR, LABSPEC, [place], [place], [place], [place], KETONESUR, PROTEINUR, UROBILINOGEN, NITRITE, [place]   Microbiology Recent Results (from the past 240 hour(s))  Surgical pcr screen     Status: None   Collection Time: 04/24/18 11:40 AM  Result Value Ref Range Status   MRSA, PCR NEGATIVE NEGATIVE Final   Staphylococcus aureus NEGATIVE NEGATIVE Final    Comment: (NOTE) The Xpert SA Assay (FDA approved for NASAL specimens in patients 74 years of age and older), is one component of a comprehensive surveillance program. It is not intended to diagnose infection nor to guide or monitor treatment. Performed at [facility], [place] [address], [place], [place] [address]        Inpatient Medications:   Scheduled Meds: . feeding supplement (ENSURE ENLIVE)  237 mL Oral BID BM  . fentaNYL      . pantoprazole  40 mg Oral Daily   Continuous Infusions: . sodium  chloride 50 mL/hr at 04/30/18 1457  . lactated ringers 50 mL/hr at 04/30/18 0739     Radiological Exams on Admission: Dg Chest Port 1 View  Result Date: 04/30/2018 CLINICAL DATA:  Postop hypoxia EXAM: PORTABLE CHEST 1 VIEW COMPARISON:  01/12/2015 FINDINGS: Left basilar density compatible with large hiatal hernia. Cardiac enlargement without heart failure. Atherosclerotic aorta. Mild atelectasis in the bases.  Negative for pneumonia. IMPRESSION: Bibasilar atelectasis and large hiatal hernia. Electronically Signed   By: Franchot Gallo M.D.   On: 04/30/2018 14:10    Impression/Recommendations Principal problem:   Hypoxia Continue supplemental oxygen. Continue incentive spirometry. She and her husband stated that they are not interested right now in further work-up, since they state that this has been checked by her cardiologist and other physicians.  They would like to go home tomorrow and concentrate on recovering from this procedure.  Active Problems:   Hypertension Continue amlodipine 5 mg p.o. daily. Monitor blood pressure and heart rate.    Hyperlipidemia On atorvastatin 40 mg p.o. daily.    Coronary artery disease Continue amlodipine and atorvastatin.    Anemia, iron deficiency Monitor H&H as needed. Continue ferrous sulfate at home, but she will take more stool softeners to avoid constipation.    GERD (gastroesophageal reflux disease) I added Protonix 20 mg p.o. daily.    Bladder tumor Continue care per urology.  Thank you for this consultation.  Our [facility] hospitalist team will follow the patient with you.   Time Spent: Tylenol 55 minutes were spent during the process of this consult.  Reubin Milan M.D. [facility] 04/30/2018, 2:59 PM

## 2018-04-30 NOTE — Transfer of Care (Signed)
Immediate Anesthesia Transfer of Care Note  Patient: Victoria Short  Procedure(s) Performed: Procedure(s): TRANSURETHRAL RESECTION OF BLADDER TUMOR (TURBT) (N/A)  Patient Location: PACU  Anesthesia Type:General  Level of Consciousness:  sedated, patient cooperative and responds to stimulation  Airway & Oxygen Therapy:Patient Spontanous Breathing and Patient connected to face mask oxgen  Post-op Assessment:  Report given to PACU RN and Post -op Vital signs reviewed and stable  Post vital signs:  Reviewed and stable  Last Vitals:  Vitals:   04/30/18 0723  BP: (!) 158/78  Pulse: 62  Resp: 16  Temp: 36.5 C  SpO2: 78%    Complications: No apparent anesthesia complications

## 2018-04-30 NOTE — Progress Notes (Signed)
Call placed to Dr Lissa Hoard Re o2 sats at 84%. He come to bedside.

## 2018-05-01 ENCOUNTER — Encounter (HOSPITAL_COMMUNITY): Payer: Self-pay | Admitting: Urology

## 2018-05-01 DIAGNOSIS — C674 Malignant neoplasm of posterior wall of bladder: Secondary | ICD-10-CM | POA: Diagnosis not present

## 2018-05-01 DIAGNOSIS — E785 Hyperlipidemia, unspecified: Secondary | ICD-10-CM | POA: Diagnosis not present

## 2018-05-01 DIAGNOSIS — D509 Iron deficiency anemia, unspecified: Secondary | ICD-10-CM | POA: Diagnosis not present

## 2018-05-01 DIAGNOSIS — R0902 Hypoxemia: Secondary | ICD-10-CM | POA: Diagnosis not present

## 2018-05-01 DIAGNOSIS — I2510 Atherosclerotic heart disease of native coronary artery without angina pectoris: Secondary | ICD-10-CM | POA: Diagnosis not present

## 2018-05-01 DIAGNOSIS — K219 Gastro-esophageal reflux disease without esophagitis: Secondary | ICD-10-CM | POA: Diagnosis not present

## 2018-05-01 NOTE — Anesthesia Postprocedure Evaluation (Signed)
Anesthesia Post Note  Patient: Victoria Short  Procedure(s) Performed: TRANSURETHRAL RESECTION OF BLADDER TUMOR (TURBT) (N/A )     Patient location during evaluation: PACU Anesthesia Type: General Level of consciousness: sedated and patient cooperative Pain management: pain level controlled Vital Signs Assessment: post-procedure vital signs reviewed and stable Respiratory status: spontaneous breathing Cardiovascular status: stable Anesthetic complications: no    Last Vitals:  Vitals:   05/01/18 0508 05/01/18 0632  BP: 121/63   Pulse: 66 (!) 57  Resp: 18   Temp: 36.9 C   SpO2: 96% 95%    Last Pain:  Vitals:   05/01/18 0741  TempSrc:   PainSc: 0-No pain                 Nolon Nations

## 2018-05-01 NOTE — Discharge Summary (Signed)
Physician Discharge Summary      Patient ID: Victoria Short MRN: [id number] DOB/AGE: 10-26-1924 84 y.o.  Admit date: 04/30/2018 Discharge date: 05/01/2018  Admission Diagnoses: BLADDER TUMORS  2.  Transient postoperative hypoxia  Discharge Diagnoses:  Principal Problem:   Hypoxia Active Problems:   Hypertension   Hyperlipidemia   Coronary artery disease   Anemia, iron deficiency   GERD (gastroesophageal reflux disease)   Bladder tumor   Discharged Condition: good  Hospital Course: She underwent an uncomplicated transurethral resection of bladder tumors and a Foley catheter was left in place postoperatively.  She was felt to be ready for discharge however in the recovery room she had transient episodes of low oxygen saturation.  Despite the fact that she was completely asymptomatic it was felt that due to her advanced age overnight observation was indicated.  She was evaluated by the hospitalist which was greatly appreciated and all of her lab work returned normal with an unremarkable chest x-ray.  She did well overnight and has no pulmonary complaints this morning.  Her urine has cleared.  She is having some slight sense of urgency due to her recent bladder surgery.  At this time she is felt to be ready for discharge home.  Discharge Exam: Blood pressure 121/63, pulse 66, temperature 98.4 F (36.9 C), temperature source Oral, resp. rate 18, height 5\' 5"  (1.651 m), weight 56 kg, SpO2 96 %. General: Awake, alert and in no apparent distress. Chest: Normal respiratory effort. Cardiovascular: Regular rate and rhythm. Abdomen: Soft, nontender, nondistended. GU: Foley catheter in place and draining clear urine.   Disposition: Discharge disposition: 01-Home or Self Care       Discharge Instructions    Discharge patient   Complete by:  As directed    Discharge disposition:  01-Home or Self Care   Discharge patient date:  04/30/2018   Discharge patient   Complete by:  As  directed    Discharge disposition:  01-Home or Self Care   Discharge patient date:  05/01/2018   Urinary leg bag   Complete by:  As directed    Give patient a leg bag on discharge.     Allergies as of 05/01/2018      Reactions   Brilinta [ticagrelor] Shortness Of Breath, Nausea And Vomiting, Other (See Comments)   dyspnea   Iodine Nausea And Vomiting, Other (See Comments)   AND DIZZY   Shellfish Allergy Nausea And Vomiting, Other (See Comments)   Unsteady gait/  PER PT ONLY SHRIMP   Streptomycin Nausea And Vomiting   Iodinated Diagnostic Agents Nausea Only      Medication List    STOP taking these medications   levofloxacin 500 MG tablet Commonly known as:  LEVAQUIN     TAKE these medications   amLODipine 5 MG tablet Commonly known as:  NORVASC Take 5 mg by mouth every morning. for high blood pressure   aspirin EC 81 MG tablet Take 81 mg by mouth daily.   atorvastatin 10 MG tablet Commonly known as:  LIPITOR Take 10 mg by mouth every morning.   Biotin 10000 MCG Tabs Take 10,000 mcg by mouth 2 (two) times daily.   CALCIUM 600+D 600-800 MG-UNIT Tabs Generic drug:  Calcium Carb-Cholecalciferol Take 1 tablet by mouth 2 (two) times daily.   cholecalciferol 400 units Tabs tablet Commonly known as:  VITAMIN D Take 800 Units by mouth daily.   E-R-O EAR WAX REMOVAL SYSTEM 6.5 % OTIC solution Generic drug:  carbamide peroxide  Place 5 drops into both ears daily as needed (ear wax).   Ferrous Sulfate 27 MG Tabs Take 27 mg by mouth daily.   isosorbide mononitrate 30 MG 24 hr tablet Commonly known as:  IMDUR Take 30 mg by mouth every morning.   phenazopyridine 200 MG tablet Commonly known as:  PYRIDIUM Take 1 tablet (200 mg total) by mouth 3 (three) times daily as needed for pain.   PREVAGEN PO Take 1 tablet by mouth daily.   traMADol 50 MG tablet Commonly known as:  ULTRAM Take 1 tablet (50 mg total) by mouth every 6 (six) hours as needed.   TRAVEL SICKNESS  25 MG Chew Generic drug:  Meclizine HCl Chew 50 mg by mouth daily as needed (dizziness).   TUMS PO Take 1 tablet by mouth daily as needed (heartburn).   vitamin C with rose hips 500 MG tablet Take 500 mg by mouth daily.      Follow-up Information    Kathie Rhodes, MD On 05/08/2018.   Specialty:  Urology Why:  For your appiontment at 11:30 Contact information: [place] [address] [phone]           Signed: Claybon Jabs 05/01/2018, 6:27 AM

## 2018-05-03 ENCOUNTER — Encounter: Payer: Self-pay | Admitting: Student

## 2018-05-03 ENCOUNTER — Ambulatory Visit (INDEPENDENT_AMBULATORY_CARE_PROVIDER_SITE_OTHER): Payer: Medicare Other | Admitting: Student

## 2018-05-03 VITALS — BP 108/68 | HR 80 | Ht 65.0 in | Wt 122.0 lb

## 2018-05-03 DIAGNOSIS — R001 Bradycardia, unspecified: Secondary | ICD-10-CM

## 2018-05-03 DIAGNOSIS — I6523 Occlusion and stenosis of bilateral carotid arteries: Secondary | ICD-10-CM | POA: Diagnosis not present

## 2018-05-03 DIAGNOSIS — I10 Essential (primary) hypertension: Secondary | ICD-10-CM | POA: Diagnosis not present

## 2018-05-03 DIAGNOSIS — I2510 Atherosclerotic heart disease of native coronary artery without angina pectoris: Secondary | ICD-10-CM

## 2018-05-03 DIAGNOSIS — E785 Hyperlipidemia, unspecified: Secondary | ICD-10-CM | POA: Diagnosis not present

## 2018-05-03 NOTE — Progress Notes (Signed)
Cardiology Office Note    Date:  05/03/2018   ID:  Shanita Kanan, DOB 01-13-1925, MRN [id number]  PCP:  Curlene Labrum, MD  Cardiologist: [name], MD    Chief Complaint  Patient presents with  . Follow-up    2 month visit    History of Present Illness:    Chelbi Herber is a 84 y.o. female past medical history of CAD (s/p DES to LAD in 2012, patent stent by repeat cath in 01/2015), carotid artery stenosis, HTN, HLD, Type 2 DM, and baseline bradycardia presents to the office today for 12-month follow-up.  He was last examined Dr. Bronson Ing in 02/2018 and reported having continual dyspnea on exertion but denied any associated chest pain. She was noted to be bradycardic with heart rate in the 40's during her office visit, therefore Metoprolol was held. A cardiac event monitor was recommended for further assessment and she was noted to have mostly sinus rhythm with sinus arrhythmia and an average heart rate of 70 bpm with no significant arrhythmias. Given no significant findings, a Lexiscan Myoview was arranged for ischemic evaluation. This was performed on 04/05/2018 and showed no perfusion defects consistent with prior infarct or current ischemia and was overall a low risk study.  In the interim, she was admitted to [facility] from 04/30/2018 to 05/01/2018 for transurethral resection of bladder tumors. She was to be discharged the same day but did have transient episodes of hypoxia while in recovery, therefore she was admitted overnight for observation. The following morning, she was felt to be back to baseline and was discharged home.  In talking with the patient today, she reports that her respiratory status has significantly improved since stopping Lopressor. She has been following her heart rate at home and it has been in the 70's to 80's. She was recently discharged from the hospital yesterday and reports doing well following her surgery. She denies any recent chest pain,  palpitations, dyspnea on exertion, orthopnea, or PND.  Does have occasional lower extremity edema in the setting of varicose veins. She does wear compression stockings regularly.   Past Medical History:  Diagnosis Date  . Arthritis    hands  . Benign neoplasm of pituitary gland (HCC)    per last mri 01-30-2015  slight decrease macro adenoma  . Bladder tumor   . Borderline diabetes mellitus   . Cardiogenic shock ([place])    a. 05/2011 requiring pressor therapy.  . Carotid artery disease ([place])    per last duplex in epic 05-06-2015   bilateral ICA 1-39%  . Constipation   . Coronary artery disease cardiologist-  dr Bronson Ing   a. NSTEMI 05/2011 s/p DES to LAD, with ?Takotsubo distribution of cardiomyopathy at that time with cardiogenic shock. b. NSTEMI 06/2011 ?spasm - stable CAD, normal EF. c. Patent LAD stent, nonobst CAD by cath 08/20/12 with normal EF. d. Hx dyspnea with Brilinta. e. patent stent by cath in 01/2015  . DOE (dyspnea on exertion)   . Dyspnea    with exertion and also noted some at rest  . Erosive gastritis   . Fatigue   . GERD (gastroesophageal reflux disease)   . Heart murmur   . Hematuria    passes blood clots occ  . Hemorrhoids   . Hiatal hernia   . History of gastritis 05/2014   w/ erosions  . History of GI bleed 05/2015   s/p  EGD with ablation duodenal polyp and gastric polypectomy with post gi bleed ,  s/p clipping  . History of non-ST elevation myocardial infarction (NSTEMI) 05/11/2011   w/ cardiogenic shock requiring pressor therapy,  and s/p  PCI and DES to LAD  . History of skin cancer   . HOH (hard of hearing)    bilateral wears hearing aides  . Hyperlipidemia   . Hypertension   . Hypertrophic cardiomyopathy ([place])    a. echo 08/2012: focal basal septal hypertrophy, mildly elevated gradient 30mmHg across LVOT.  . Iron deficiency anemia due to chronic blood loss    hemotologist --- dr Lauree Chandler @ [facility] office in [place],, hx IV iron infusions  .  Melanoma ([place])    nose  . Mild obstructive sleep apnea    per pt no cpap recommendation  . Myocardial infarction ([place])   . Nasal polyp   . Neuropathy, peripheral   . Nocturia more than twice per night   . OAB (overactive bladder)   . Osteoporosis   . Pneumonia    childhood  . Polyp of stomach and duodenum   . Poor circulation    lower ext bil  . Pre-diabetes   . S/P drug eluting coronary stent placement 05/12/2011   to mid LAD  . Scoliosis   . Stress-induced cardiomyopathy 05/2011   a. ?Takotubso cardiomyopathy - distribution was different than that of LAD stenosis.  Marland Kitchen Unsteady gait   . Urinary incontinence   . Vertigo, intermittent   . Vitamin D deficiency   . Wears hearing aid in both ears     Past Surgical History:  Procedure Laterality Date  . APPENDECTOMY  1953  . CARDIAC CATHETERIZATION  06/22/2011   patent LAD stent. Normal EF  . CARDIAC CATHETERIZATION  08/2012   Patent LAD stent with normal ejection fraction.  Marland Kitchen CARDIAC CATHETERIZATION N/A 01/19/2015   Procedure: Right/Left Heart Cath and Coronary Angiography;  Surgeon: Jettie Booze, MD;  Location: [place] [facility];  Service: Cardiovascular;  Laterality: N/A;  . CARDIOVASCULAR STRESS TEST  11-26-2014   dr Bronson Ing   Low risk nuclear study w/ no ischemia or infarct/  normal LV function and wall motion , ef 72%  . CATARACT EXTRACTION W/ INTRAOCULAR LENS  IMPLANT, BILATERAL  2009  approx.  . COLONOSCOPY    . CORONARY ANGIOPLASTY WITH STENT PLACEMENT  05-12-2011  dr Shelva Majestic   mid LAD: 2.5 X 16 mm Promus DES for 85-90% stenosis/  pCFx 30-40%, diagonal 20%, ef 40-45%/ ascending aorta stenosis 20mm gradient/  hypocontratility involve distal anterolateral apical and distal inferior wall  . ESOPHAGOGASTRODUODENOSCOPY N/A 05/23/2014   Procedure: ESOPHAGOGASTRODUODENOSCOPY (EGD);  Surgeon: Rogene Houston, MD;  [facility];  Service: Endoscopy;  Laterality: N/A;  230 - moved to 1:05 - Ann notified  pt  . ESOPHAGOGASTRODUODENOSCOPY N/A 05/20/2015   Procedure: ESOPHAGOGASTRODUODENOSCOPY (EGD);  Surgeon: Rogene Houston, MD;  [facility]: [facility];  Service: Endoscopy;  Laterality: N/A;  1:00  . ESOPHAGOGASTRODUODENOSCOPY N/A 05/28/2015   Procedure: ESOPHAGOGASTRODUODENOSCOPY (EGD);  Surgeon: Rogene Houston, MD;  [facility];  Service: Endoscopy;  Laterality: N/A;  . GIVENS CAPSULE STUDY N/A 05/12/2014   Procedure: GIVENS CAPSULE STUDY;  Surgeon: Rogene Houston, MD;  [facility]: [facility];  Service: Endoscopy;  Laterality: N/A;  730  . LEFT HEART CATHETERIZATION WITH CORONARY ANGIOGRAM N/A 05/12/2011   Procedure: LEFT HEART CATHETERIZATION WITH CORONARY ANGIOGRAM;  Surgeon: Troy Sine, MD;  Location: [facility] C[facility] LAB;  Service: Cardiovascular;  Laterality: N/A;  . LEFT HEART  CATHETERIZATION WITH CORONARY ANGIOGRAM N/A 06/22/2011   Procedure: LEFT HEART CATHETERIZATION WITH CORONARY ANGIOGRAM;  Surgeon: Wellington Hampshire, MD;  Location: [place] CATH LAB;  Service: Cardiovascular;  Laterality: N/A;  . LEFT HEART CATHETERIZATION WITH CORONARY ANGIOGRAM N/A 08/20/2012   Procedure: LEFT HEART CATHETERIZATION WITH CORONARY ANGIOGRAM;  Surgeon: Burnell Blanks, MD;  Location: [facility] CATH LAB;  Service: Cardiovascular;  Laterality: N/A;  . PARTIAL OOPHORECTOMY Right 1955   BENIGN  . PERCUTANEOUS CORONARY STENT INTERVENTION (PCI-S)  05/12/2011   Procedure: PERCUTANEOUS CORONARY STENT INTERVENTION (PCI-S);  Surgeon: Troy Sine, MD;  Location: [facility] CATH LAB;  Service: Cardiovascular;;  . POLYPECTOMY N/A 05/20/2015   Procedure: EGD with polypectomy;  Surgeon: Rogene Houston, MD;  [facility];  Service: Endoscopy;  Laterality: N/A;  . TRANSTHORACIC ECHOCARDIOGRAM  10-09-2017   dr Bronson Ing   ef 55-60%, moderate conentric LVH, grade 2 diastolic dysfunction/  basal septal hypertrophy with systolic anterior motion of the mitral valve outflow track peak grandient greater than 159mmHg  with valsalva/  mild AR, MR, and TR  . TRANSURETHRAL RESECTION OF BLADDER TUMOR N/A 04/30/2018   Procedure: TRANSURETHRAL RESECTION OF BLADDER TUMOR (TURBT);  Surgeon: Kathie Rhodes, MD;  Location: [facility];  Service: Urology;  Laterality: N/A;  . WISDOM TOOTH EXTRACTION      Current Medications: Outpatient Medications Prior to Visit  Medication Sig Dispense Refill  . amLODipine (NORVASC) 5 MG tablet Take 5 mg by mouth every morning. for high blood pressure  3  . Apoaequorin (PREVAGEN PO) Take 1 tablet by mouth daily.    . Ascorbic Acid (VITAMIN C WITH ROSE HIPS) 500 MG tablet Take 500 mg by mouth daily.    Marland Kitchen aspirin EC 81 MG tablet Take 81 mg by mouth daily.    Marland Kitchen atorvastatin (LIPITOR) 10 MG tablet Take 10 mg by mouth every morning.   3  . Biotin 10000 MCG TABS Take 10,000 mcg by mouth 2 (two) times daily.     . Calcium Carb-Cholecalciferol (CALCIUM 600+D) 600-800 MG-UNIT TABS Take 1 tablet by mouth 2 (two) times daily.    . Calcium Carbonate Antacid (TUMS PO) Take 1 tablet by mouth daily as needed (heartburn).     . carbamide peroxide (E-R-O EAR WAX REMOVAL SYSTEM) 6.5 % OTIC solution Place 5 drops into both ears daily as needed (ear wax).    . cholecalciferol (VITAMIN D) 400 units TABS tablet Take 800 Units by mouth daily.    . Ferrous Sulfate 27 MG TABS Take 27 mg by mouth daily.    . isosorbide mononitrate (IMDUR) 30 MG 24 hr tablet Take 30 mg by mouth every morning.   3  . Meclizine HCl (TRAVEL SICKNESS) 25 MG CHEW Chew 50 mg by mouth daily as needed (dizziness).    . phenazopyridine (PYRIDIUM) 200 MG tablet Take 1 tablet (200 mg total) by mouth 3 (three) times daily as needed for pain. 20 tablet 0  . traMADol (ULTRAM) 50 MG tablet Take 1 tablet (50 mg total) by mouth every 6 (six) hours as needed. 12 tablet 0   No facility-administered medications prior to visit.      Allergies:   Brilinta [ticagrelor]; Iodine; Metoprolol; Shellfish allergy; Streptomycin; and Iodinated diagnostic  agents   Social History   Socioeconomic History  . Marital status: Married    Spouse name: Not on file  . Number of children: Not on file  . Years of education: Not on file  . Highest education level:  Not on file  Occupational History  . Not on file  Social Needs  . Financial resource strain: Not on file  . Food insecurity:    Worry: Not on file    Inability: Not on file  . Transportation needs:    Medical: Not on file    Non-medical: Not on file  Tobacco Use  . Smoking status: Former Smoker    Packs/day: 1.00    Years: 12.00    Pack years: 12.00    Types: Cigarettes    Last attempt to quit: 05/11/1979    Years since quitting: 39.0  . Smokeless tobacco: Never Used  Substance and Sexual Activity  . Alcohol use: No  . Drug use: No  . Sexual activity: Yes    Birth control/protection: Post-menopausal  Lifestyle  . Physical activity:    Days per week: Not on file    Minutes per session: Not on file  . Stress: Not on file  Relationships  . Social connections:    Talks on phone: Not on file    Gets together: Not on file    Attends religious service: Not on file    Active member of club or organization: Not on file    Attends meetings of clubs or organizations: Not on file    Relationship status: Not on file  Other Topics Concern  . Not on file  Social History Narrative  . Not on file     Family History:  The patient's family history includes Diabetic kidney disease (age of onset: 85) in her father; Heart attack (age of onset: 15) in her mother; Prostate cancer in her father.   Review of Systems:   Please see the history of present illness.     General:  No chills, fever, night sweats or weight changes.  Cardiovascular:  No chest pain, edema, orthopnea, palpitations, paroxysmal nocturnal dyspnea. Positive for dyspnea on exertion (improved).  Dermatological: No rash, lesions/masses Respiratory: No cough, dyspnea Urologic: No hematuria, dysuria Abdominal:   No  nausea, vomiting, diarrhea, bright red blood per rectum, melena, or hematemesis Neurologic:  No visual changes, wkns, changes in mental status.  All other systems reviewed and are otherwise negative except as noted above.   Physical Exam:    VS:  BP 108/68 (BP Location: Right Arm)   Pulse 80   Ht 5\' 5"  (1.651 m)   Wt 122 lb (55.3 kg)   SpO2 93%   BMI 20.30 kg/m    General: Well developed, elderly Caucasian female appearing in no acute distress. Head: Normocephalic, atraumatic, sclera non-icteric, no xanthomas, nares are without discharge.  Neck: No carotid bruits. JVD not elevated.  Lungs: Respirations regular and unlabored, without wheezes or rales.  Heart: Regular rate and rhythm. No S3 or S4.  No murmur, no rubs, or gallops appreciated. Abdomen: Soft, non-tender, non-distended with normoactive bowel sounds. No hepatomegaly. No rebound/guarding. No obvious abdominal masses. Msk:  Strength and tone appear normal for age. No joint deformities or effusions. Extremities: No clubbing or cyanosis. Trace ankle edema.  Distal pedal pulses are 2+ bilaterally. Varicose veins bilaterally. Neuro: Alert and oriented X 3. Moves all extremities spontaneously. No focal deficits noted. Psych:  Responds to questions appropriately with a normal affect. Skin: No rashes or lesions noted  Wt Readings from Last 3 Encounters:  05/03/18 122 lb (55.3 kg)  04/30/18 123 lb 8 oz (56 kg)  04/24/18 123 lb 8 oz (56 kg)     Studies/Labs Reviewed:   EKG:  EKG is not ordered today.   Recent Labs: 04/30/2018: ALT 14; BUN 19; Creatinine, Ser 0.57; Hemoglobin 14.6; Magnesium 1.9; Platelets 162; Potassium 4.6; Sodium 142   Lipid Panel No results found for: CHOL, TRIG, HDL, CHOLHDL, VLDL, LDLCALC, LDLDIRECT  Additional studies/ records that were reviewed today include:   Echocardiogram: 10/2017   Event Monitor: 03/2018  Sinus rhythm and sinus arrhythmia seen. Average heart rate 70 bpm. There were no  significant arrhythmias.  Symptoms correlated with sinus rhythm and sinus arrhythmia.  NST: 04/2018  There was no ST segment deviation noted during stress.  The study is normal. There are no perfusion defects consistent with prior infarct or current ischemia.  This is a low risk study.  The left ventricular ejection fraction is hyperdynamic (>65%).     Assessment:    1. Coronary artery disease involving native coronary artery of native heart without angina pectoris   2. Bradycardia   3. Essential hypertension   4. Hyperlipidemia LDL goal <70   5. Bilateral carotid artery stenosis      Plan:   In order of problems listed above:  1. CAD - s/p DES to LAD in 2012 with patent stent by repeat cath in 01/2015. Recent NST earlier this month showed no perfusion defects consistent with prior infarct or current ischemia and was overall a low risk study. She denies any recent chest pain and dyspnea has improved with resolution of her bradycardia.  - continue ASA, statin, Amlodipine, and Imdur.   2. Bradycardia - now resolved with discontinuation of Lopressor and she reports significant improvement in her dyspnea. Will continue to avoid the use of AV nodal blocking agents.   3. HTN - BP is well-controlled at 108/68 during today's visit. Patient's husband reports her SBP is sometimes at 100 or 101 but she denies any symptoms with this. I encouraged them to keep a BP log in the ambulatory setting. If BP remains soft or she develops symptoms, can reduce Amlodipine from 5mg  daily to 2.5mg  daily.   4. HLD - followed by PCP. She remains on Atorvastatin 10mg  daily.   5. Carotid Artery Stenosis - minimal stenosis by doppler studies in 05/2015. No plans for repeat imaging given minimal stenosis and her advanced age.    Medication Adjustments/Labs and Tests Ordered: Current medicines are reviewed at length with the patient today.  Concerns regarding medicines are outlined above.  Medication  changes, Labs and Tests ordered today are listed in the Patient Instructions below. Patient Instructions  Medication Instructions:  Your physician recommends that you continue on your current medications as directed. Please refer to the Current Medication list given to you today.   Labwork: NONE  Testing/Procedures: NONE  Follow-Up: Your physician recommends that you schedule a follow-up appointment in: 3-4 MONTHS   Any Other Special Instructions Will Be Listed Below (If Applicable).  If you need a refill on your cardiac medications before your next appointment, please call your pharmacy.    Signed, Erma Heritage, PA-C  05/03/2018 7:45 PM    [address] [place], [place] [address] Phone: [phone]

## 2018-05-03 NOTE — Patient Instructions (Signed)
Medication Instructions:  Your physician recommends that you continue on your current medications as directed. Please refer to the Current Medication list given to you today.   Labwork: NONE  Testing/Procedures: NONE  Follow-Up: Your physician recommends that you schedule a follow-up appointment in: 3-4 MONTHS   Any Other Special Instructions Will Be Listed Below (If Applicable).     If you need a refill on your cardiac medications before your next appointment, please call your pharmacy.

## 2018-05-08 DIAGNOSIS — N311 Reflex neuropathic bladder, not elsewhere classified: Secondary | ICD-10-CM | POA: Diagnosis not present

## 2018-05-08 DIAGNOSIS — C678 Malignant neoplasm of overlapping sites of bladder: Secondary | ICD-10-CM | POA: Diagnosis not present

## 2018-05-11 DIAGNOSIS — Z23 Encounter for immunization: Secondary | ICD-10-CM | POA: Diagnosis not present

## 2018-05-16 ENCOUNTER — Ambulatory Visit
Admission: RE | Admit: 2018-05-16 | Discharge: 2018-05-16 | Disposition: A | Discharge status: Home / Self Care | Payer: Medicare Other | Source: Ambulatory Visit | Attending: Radiation Oncology | Admitting: Radiation Oncology

## 2018-05-16 ENCOUNTER — Encounter: Payer: Self-pay | Admitting: Radiation Oncology

## 2018-05-16 ENCOUNTER — Other Ambulatory Visit: Payer: Self-pay

## 2018-05-16 VITALS — BP 148/75 | HR 71 | Temp 98.4°F | Resp 20 | Ht 65.0 in | Wt 122.8 lb

## 2018-05-16 DIAGNOSIS — Z9889 Other specified postprocedural states: Secondary | ICD-10-CM | POA: Diagnosis not present

## 2018-05-16 DIAGNOSIS — C672 Malignant neoplasm of lateral wall of bladder: Secondary | ICD-10-CM | POA: Diagnosis not present

## 2018-05-16 DIAGNOSIS — I119 Hypertensive heart disease without heart failure: Secondary | ICD-10-CM | POA: Insufficient documentation

## 2018-05-16 DIAGNOSIS — E119 Type 2 diabetes mellitus without complications: Secondary | ICD-10-CM | POA: Diagnosis not present

## 2018-05-16 DIAGNOSIS — Z87891 Personal history of nicotine dependence: Secondary | ICD-10-CM | POA: Insufficient documentation

## 2018-05-16 DIAGNOSIS — Z79899 Other long term (current) drug therapy: Secondary | ICD-10-CM | POA: Diagnosis not present

## 2018-05-16 DIAGNOSIS — I2510 Atherosclerotic heart disease of native coronary artery without angina pectoris: Secondary | ICD-10-CM | POA: Diagnosis not present

## 2018-05-16 DIAGNOSIS — R310 Gross hematuria: Secondary | ICD-10-CM | POA: Diagnosis not present

## 2018-05-16 NOTE — Progress Notes (Signed)
See progress note under physician encounter. 

## 2018-05-16 NOTE — Progress Notes (Signed)
Radiation Oncology         [phone] ________________________________  Initial outpatient Consultation  Name: Victoria Short MRN: [id number]  Date of Service: 05/16/2018 DOB: 1924/08/13  WU:[id number], Virgina Evener, MD  Kathie Rhodes, MD   REFERRING PHYSICIAN: Kathie Rhodes, MD  DIAGNOSIS: 84 y.o. female with Stage T2 muscle invasive high grade urothelial carcinoma.    ICD-10-CM   1. Cancer of lateral wall of urinary bladder (HCC) C67.2     HISTORY OF PRESENT ILLNESS: Victoria Short is a 84 y.o. female seen at the request of Dr. Karsten Ro. She has been experiencing intermittent gross hematuria for the past two years and has received iron infusions monthly under the care and direction of Dr. Federico Flake. The hematuria became more consistent recently, causing her to present to Dr. Federico Flake in hematology in May 2019. She underwent an abdomen/pelvis CT scan on 02/05/18 which showed bladder wall thickening and nodularity in the right lateral wall, suspicious for bladder cancer. Recommendation was further evaluation with cystoscopy. Therefore, the patient was referred to Dr. Karsten Ro on 03/27/2018 and underwent office cystoscopy, which demonstrated two large tumors involving the right wall of the bladder and multiple areas of papillary abnormality at the dome and posterior wall. She proceeded with TURBT for surgical resection on 04/30/18 with final pathology confirming an infiltrating high grade urothelial carcinoma invading the muscularis propria and lymphovascular invasion, stage T2. The patient is not an ideal surgical candidate, therefore after reviewing her pathology with Dr. Karsten Ro, she is referred today to discuss the potential role of concurrent chemoradiation for treatment of her disease.  She reports that the gross hematuria initially resolved following the TURBT, but this past week, she has started noticing a recurrence of the gross hematuria. She reports a light pink tinge to the urine in the  mornings, with occasional small clots passing as the day progresses. She has continued using Pyridium daily since the time of surgery so she is unsure how much or the discoloration that she sees later in the day is medication versus gross hematuria.  She denies any dysuria or suprapubic pain. She does report increased daytime frequency, urgency and nocturia x6-7/night.   PREVIOUS RADIATION THERAPY: No  PAST MEDICAL HISTORY:  Past Medical History:  Diagnosis Date  . Arthritis    hands  . Benign neoplasm of pituitary gland (HCC)    per last mri 01-30-2015  slight decrease macro adenoma  . Bladder tumor   . Borderline diabetes mellitus   . Cardiogenic shock ([place])    a. 05/2011 requiring pressor therapy.  . Carotid artery disease ([place])    per last duplex in epic 05-06-2015   bilateral ICA 1-39%  . Constipation   . Coronary artery disease cardiologist-  dr Bronson Ing   a. NSTEMI 05/2011 s/p DES to LAD, with ?Takotsubo distribution of cardiomyopathy at that time with cardiogenic shock. b. NSTEMI 06/2011 ?spasm - stable CAD, normal EF. c. Patent LAD stent, nonobst CAD by cath 08/20/12 with normal EF. d. Hx dyspnea with Brilinta. e. patent stent by cath in 01/2015  . DOE (dyspnea on exertion)   . Dyspnea    with exertion and also noted some at rest  . Erosive gastritis   . Fatigue   . GERD (gastroesophageal reflux disease)   . Heart murmur   . Hematuria    passes blood clots occ  . Hemorrhoids   . Hiatal hernia   . History of gastritis 05/2014   w/ erosions  . History of GI bleed  05/2015   s/p  EGD with ablation duodenal polyp and gastric polypectomy with post gi bleed , s/p clipping  . History of non-ST elevation myocardial infarction (NSTEMI) 05/11/2011   w/ cardiogenic shock requiring pressor therapy,  and s/p  PCI and DES to LAD  . History of skin cancer   . HOH (hard of hearing)    bilateral wears hearing aides  . Hyperlipidemia   . Hypertension   . Hypertrophic cardiomyopathy  ([place])    a. echo 08/2012: focal basal septal hypertrophy, mildly elevated gradient 66mmHg across LVOT.  . Iron deficiency anemia due to chronic blood loss    hemotologist --- dr Lauree Chandler @ [facility] office in [place],, hx IV iron infusions  . Melanoma ([place])    nose  . Mild obstructive sleep apnea    per pt no cpap recommendation  . Myocardial infarction ([place])   . Nasal polyp   . Neuropathy, peripheral   . Nocturia more than twice per night   . OAB (overactive bladder)   . Osteoporosis   . Pneumonia    childhood  . Polyp of stomach and duodenum   . Poor circulation    lower ext bil  . Pre-diabetes   . S/P drug eluting coronary stent placement 05/12/2011   to mid LAD  . Scoliosis   . Stress-induced cardiomyopathy 05/2011   a. ?Takotubso cardiomyopathy - distribution was different than that of LAD stenosis.  Marland Kitchen Unsteady gait   . Urinary incontinence   . Vertigo, intermittent   . Vitamin D deficiency   . Wears hearing aid in both ears       PAST SURGICAL HISTORY: Past Surgical History:  Procedure Laterality Date  . APPENDECTOMY  1953  . CARDIAC CATHETERIZATION  06/22/2011   patent LAD stent. Normal EF  . CARDIAC CATHETERIZATION  08/2012   Patent LAD stent with normal ejection fraction.  Marland Kitchen CARDIAC CATHETERIZATION N/A 01/19/2015   Procedure: Right/Left Heart Cath and Coronary Angiography;  Surgeon: Jettie Booze, MD;  Location: [facility];  Service: Cardiovascular;  Laterality: N/A;  . CARDIOVASCULAR STRESS TEST  11-26-2014   dr Bronson Ing   Low risk nuclear study w/ no ischemia or infarct/  normal LV function and wall motion , ef 72%  . CATARACT EXTRACTION W/ INTRAOCULAR LENS  IMPLANT, BILATERAL  2009  approx.  . COLONOSCOPY    . CORONARY ANGIOPLASTY WITH STENT PLACEMENT  05-12-2011  dr Shelva Majestic   mid LAD: 2.5 X 16 mm Promus DES for 85-90% stenosis/  pCFx 30-40%, diagonal 20%, ef 40-45%/ ascending aorta stenosis 72mm gradient/  hypocontratility involve  distal anterolateral apical and distal inferior wall  . ESOPHAGOGASTRODUODENOSCOPY N/A 05/23/2014   Procedure: ESOPHAGOGASTRODUODENOSCOPY (EGD);  Surgeon: Rogene Houston, MD;  [facility];  Service: Endoscopy;  Laterality: N/A;  230 - moved to 1:05 - Ann notified pt  . ESOPHAGOGASTRODUODENOSCOPY N/A 05/20/2015   Procedure: ESOPHAGOGASTRODUODENOSCOPY (EGD);  Surgeon: Rogene Houston, MD;  [facility];  Service: Endoscopy;  Laterality: N/A;  1:00  . ESOPHAGOGASTRODUODENOSCOPY N/A 05/28/2015   Procedure: ESOPHAGOGASTRODUODENOSCOPY (EGD);  Surgeon: Rogene Houston, MD;  [facility];  Service: Endoscopy;  Laterality: N/A;  . GIVENS CAPSULE STUDY N/A 05/12/2014   Procedure: GIVENS CAPSULE STUDY;  Surgeon: Rogene Houston, MD;  [facility];  Service: Endoscopy;  Laterality: N/A;  730  . LEFT HEART CATHETERIZATION WITH CORONARY ANGIOGRAM N/A 05/12/2011   Procedure: LEFT HEART CATHETERIZATION WITH  CORONARY ANGIOGRAM;  Surgeon: Troy Sine, MD;  Location: [facility] CATH LAB;  Service: Cardiovascular;  Laterality: N/A;  . LEFT HEART CATHETERIZATION WITH CORONARY ANGIOGRAM N/A 06/22/2011   Procedure: LEFT HEART CATHETERIZATION WITH CORONARY ANGIOGRAM;  Surgeon: Wellington Hampshire, MD;  Location: [facility];  Service: Cardiovascular;  Laterality: N/A;  . LEFT HEART CATHETERIZATION WITH CORONARY ANGIOGRAM N/A 08/20/2012   Procedure: LEFT HEART CATHETERIZATION WITH CORONARY ANGIOGRAM;  Surgeon: Burnell Blanks, MD;  Location: [facility] CATH LAB;  Service: Cardiovascular;  Laterality: N/A;  . PARTIAL OOPHORECTOMY Right 1955   BENIGN  . PERCUTANEOUS CORONARY STENT INTERVENTION (PCI-S)  05/12/2011   Procedure: PERCUTANEOUS CORONARY STENT INTERVENTION (PCI-S);  Surgeon: Troy Sine, MD;  Location: [facility] CATH LAB;  Service: Cardiovascular;;  . POLYPECTOMY N/A 05/20/2015   Procedure: EGD with polypectomy;  Surgeon: Rogene Houston, MD;  [facility];  Service:  Endoscopy;  Laterality: N/A;  . TRANSTHORACIC ECHOCARDIOGRAM  10-09-2017   dr Bronson Ing   ef 55-60%, moderate conentric LVH, grade 2 diastolic dysfunction/  basal septal hypertrophy with systolic anterior motion of the mitral valve outflow track peak grandient greater than 113mmHg with valsalva/  mild AR, MR, and TR  . TRANSURETHRAL RESECTION OF BLADDER TUMOR N/A 04/30/2018   Procedure: TRANSURETHRAL RESECTION OF BLADDER TUMOR (TURBT);  Surgeon: Kathie Rhodes, MD;  Location: [facility];  Service: Urology;  Laterality: N/A;  . WISDOM TOOTH EXTRACTION      FAMILY HISTORY:  Family History  Problem Relation Age of Onset  . Heart attack Mother 16  . Diabetic kidney disease Father 74  . Prostate cancer Father     SOCIAL HISTORY:  Social History   Socioeconomic History  . Marital status: Married    Spouse name: Not on file  . Number of children: Not on file  . Years of education: Not on file  . Highest education level: Not on file  Occupational History  . Not on file  Social Needs  . Financial resource strain: Not on file  . Food insecurity:    Worry: Not on file    Inability: Not on file  . Transportation needs:    Medical: Not on file    Non-medical: Not on file  Tobacco Use  . Smoking status: Former Smoker    Packs/day: 1.00    Years: 12.00    Pack years: 12.00    Types: Cigarettes    Last attempt to quit: 05/11/1979    Years since quitting: 39.0  . Smokeless tobacco: Never Used  Substance and Sexual Activity  . Alcohol use: No  . Drug use: No  . Sexual activity: Yes    Birth control/protection: Post-menopausal  Lifestyle  . Physical activity:    Days per week: Not on file    Minutes per session: Not on file  . Stress: Not on file  Relationships  . Social connections:    Talks on phone: Not on file    Gets together: Not on file    Attends religious service: Not on file    Active member of club or organization: Not on file    Attends meetings of clubs or  organizations: Not on file    Relationship status: Not on file  . Intimate partner violence:    Fear of current or ex partner: Not on file    Emotionally abused: Not on file    Physically abused: Not on file    Forced sexual activity: Not on file  Other Topics Concern  . Not on file  Social History Narrative  . Not on file    ALLERGIES: Brilinta [ticagrelor]; Iodine; Metoprolol; Shellfish allergy; Streptomycin; and Iodinated diagnostic agents  MEDICATIONS:  Current Outpatient Medications  Medication Sig Dispense Refill  . amLODipine (NORVASC) 5 MG tablet Take 5 mg by mouth every morning. for high blood pressure  3  . Ascorbic Acid (VITAMIN C WITH ROSE HIPS) 500 MG tablet Take 500 mg by mouth daily.    Marland Kitchen aspirin EC 81 MG tablet Take 81 mg by mouth daily.    Marland Kitchen atorvastatin (LIPITOR) 10 MG tablet Take 10 mg by mouth every morning.   3  . Biotin 10000 MCG TABS Take 10,000 mcg by mouth 2 (two) times daily.     . Calcium Carb-Cholecalciferol (CALCIUM 600+D) 600-800 MG-UNIT TABS Take 1 tablet by mouth 2 (two) times daily.    . carbamide peroxide (E-R-O EAR WAX REMOVAL SYSTEM) 6.5 % OTIC solution Place 5 drops into both ears daily as needed (ear wax).    . cholecalciferol (VITAMIN D) 400 units TABS tablet Take 800 Units by mouth daily.    . Ferrous Sulfate 27 MG TABS Take 27 mg by mouth daily.    . isosorbide mononitrate (IMDUR) 30 MG 24 hr tablet Take 30 mg by mouth every morning.   3  . Meclizine HCl (TRAVEL SICKNESS) 25 MG CHEW Chew 50 mg by mouth daily as needed (dizziness).    . Mirabegron (MYRBETRIQ PO) Take by mouth.     No current facility-administered medications for this encounter.     REVIEW OF SYSTEMS:  On review of systems, the patient reports that she is doing well overall. See above in HPI for pertinent GU positives and negatives. She denies any chest pain, shortness of breath, cough, fevers, chills, night sweats, or unintended weight changes. She denies abdominal pain,  constipation, diarrhea, nausea or vomiting. She denies any new musculoskeletal or joint aches or pains. A complete review of systems is obtained and is otherwise negative.    PHYSICAL EXAM:  Wt Readings from Last 3 Encounters:  05/16/18 122 lb 12.8 oz (55.7 kg)  05/03/18 122 lb (55.3 kg)  04/30/18 123 lb 8 oz (56 kg)   Temp Readings from Last 3 Encounters:  05/16/18 98.4 F (36.9 C) (Oral)  05/01/18 98.4 F (36.9 C) (Oral)  04/24/18 98.3 F (36.8 C) (Oral)   BP Readings from Last 3 Encounters:  05/16/18 (!) 148/75  05/03/18 108/68  05/01/18 121/63   Pulse Readings from Last 3 Encounters:  05/16/18 71  05/03/18 80  05/01/18 (!) 57   Pain Assessment Pain Score: 0-No pain/10  In general this is a well appearing Caucasian woman in no acute distress. She is alert and oriented x4 and appropriate throughout the examination. HEENT reveals that the patient is normocephalic, atraumatic. EOMs are intact. PERRLA. Skin is intact without any evidence of gross lesions. Cardiovascular exam reveals a regular rate and rhythm, no clicks rubs or murmurs are auscultated. Chest is clear to auscultation bilaterally. Lymphatic assessment is performed and does not reveal any adenopathy in the cervical, supraclavicular, axillary, or inguinal chains. Abdomen has active bowel sounds in all quadrants and is intact. The abdomen is soft, non tender, non distended. Lower extremities are negative for deep calf tenderness, cyanosis or clubbing. 1+ pitting edema bilaterally.   KPS = 90  100 - Normal; no complaints; no evidence of disease. 90   - Able to carry on normal activity;  minor signs or symptoms of disease. 80   - Normal activity with effort; some signs or symptoms of disease. 11   - Cares for self; unable to carry on normal activity or to do active work. 60   - Requires occasional assistance, but is able to care for most of his personal needs. 50   - Requires considerable assistance and frequent medical  care. 65   - Disabled; requires special care and assistance. 65   - Severely disabled; hospital admission is indicated although death not imminent. 56   - Very sick; hospital admission necessary; active supportive treatment necessary. 10   - Moribund; fatal processes progressing rapidly. 0     - Dead  Karnofsky DA, Abelmann [place], [place] LS and [place] JH [phone]) The use of the nitrogen mustards in the palliative treatment of carcinoma: with particular reference to bronchogenic carcinoma Cancer 1 [id number]  LABORATORY DATA:  Lab Results  Component Value Date   WBC 5.9 04/30/2018   HGB 14.6 04/30/2018   HCT 45.7 04/30/2018   MCV 109.6 (H) 04/30/2018   PLT 162 04/30/2018   Lab Results  Component Value Date   NA 142 04/30/2018   K 4.6 04/30/2018   CL 106 04/30/2018   CO2 31 04/30/2018   Lab Results  Component Value Date   ALT 14 04/30/2018   AST 18 04/30/2018   ALKPHOS 48 04/30/2018   BILITOT 1.1 04/30/2018     RADIOGRAPHY: Dg Chest Port 1 View  Result Date: 04/30/2018 CLINICAL DATA:  Postop hypoxia EXAM: PORTABLE CHEST 1 VIEW COMPARISON:  01/12/2015 FINDINGS: Left basilar density compatible with large hiatal hernia. Cardiac enlargement without heart failure. Atherosclerotic aorta. Mild atelectasis in the bases.  Negative for pneumonia. IMPRESSION: Bibasilar atelectasis and large hiatal hernia. Electronically Signed   By: Franchot Gallo M.D.   On: 04/30/2018 14:10      IMPRESSION/PLAN: 1. 85 y.o. female with at least Stage T2 muscle invasive high grade urothelial carcinoma. Today, we talked to the patient and her husband about the findings and workup thus far. We discussed the natural history of bladder cancer and general treatment, highlighting the role of radiotherapy in the management. We discussed the available radiation techniques, and focused on the details of logistics and delivery.  Despite her advanced age, she remains very healthy overall and has a very high performance  status and therefore the recommendation is to proceed with a 6-1/2-week course of concurrent chemoradiation for a bladder preserving, curative treatment approach.  We reviewed the anticipated acute and late sequelae associated with radiation in this setting. The patient was encouraged to ask questions that were answered to her satisfaction.  At the conclusion of our conversation, the patient is interested in proceeding with concurrent chemoradiation but would like to have treatments closer to her home in [place] since they reside in [place], [place]. Therefore, we will make a referral to the radiation oncologist at [facility], Dr. Francesca Jewett, for consultation and allow them to coordinate with medical oncology at that facility.  We enjoyed meeting her and her husband today and would be happy to continue to participate in her care should she elect to pursue radiation treatments in [place], [place].  Although, logistically, it just makes more sense to pursue treatment at [facility] which will be much more convenient and of the equal quality.  We spent 60 minutes face to face with the patient and more than 50% of that time was spent in counseling and/or coordination of care.  Nicholos Johns, PA-C    Tyler Pita, MD  [facility] Direct Dial: [phone]  Fax: [phone] [email or web address]  Skype  [email or web address]   This document serves as a record of services personally performed by Tyler Pita, MD and Freeman Caldron, PA-C. It was created on their behalf by Wilburn Mylar, a trained medical scribe. The creation of this record is based on the scribe's personal observations and the provider's statements to them. This document has been checked and approved by the attending provider.

## 2018-05-16 NOTE — Progress Notes (Signed)
GU Location of Tumor / Histology: Infiltrating high grade urothelial carcinoma invading muscularis propria and lymphovascular invasion identified.  Victoria Short experienced gross hematuria in May 2019. She underwent a CT scan that revealed lesions on the right wall of the bladder.  Patient explained during consult that she experience hematuria for two years and received iron infusions every month.   Past/Anticipated interventions by urology, if any: transurethral resection of her bladder tumors  Past/Anticipated interventions by medical oncology, if any: no  Weight changes, if any: no  Bowel/Bladder complaints, if any: Reported intermittent gross hematuria, frequency, urgency, and nocturia x 6-7. Reports she stopped bleeding s/p surgery but bleeding began again this week. She reports this new onset of bleeding is heavy with clots.    Nausea/Vomiting, if any: no  Pain issues, if any:  no  SAFETY ISSUES:  Prior radiation? no  Pacemaker/ICD? no  Possible current pregnancy? no  Is the patient on methotrexate? no  Current Complaints / other details:  84 year old female. Married. AX: shellfish. Smoked a PPD for 9 years and quit 20 years ago. Resides in [place], [place] approximately 45 minutes. Father with history of prostate ca. Mother with history of what sounds like a form of GYN cancer.

## 2018-05-21 ENCOUNTER — Telehealth: Payer: Self-pay | Admitting: *Deleted

## 2018-05-21 NOTE — Telephone Encounter (Signed)
CALLED PATIENT TO INFORM OF APPT. WITH DR. YANAGIHARA IN [place] ON 05-29-18 - ARRIVAL TIME- 8:30 AM , LVM FOR A RETURN CALL

## 2018-05-22 ENCOUNTER — Telehealth: Payer: Self-pay | Admitting: Cardiovascular Disease

## 2018-05-22 NOTE — Telephone Encounter (Signed)
BP running 81/45, 81/44.  BP just prior to phone call was 102/54.  Heart rates running in the 60's.    Per [name], PA last OV notes - If BP remains soft or she develops symptoms, can reduce Amlodipine from 5mg  daily to 2.5mg  daily.  I have instructed husband Victoria Short) to have her take 1/2 tab of her Amlodipine as stated above.  Becomes symptomatic with the low BP, but feels okay when numbers are good.  Advised him to continue to log her readings over the next 10-14 days & return log to office for MD review.  If not feeling better after doing this, contact the office with update before then.  Husband verbalized understanding.

## 2018-05-22 NOTE — Telephone Encounter (Signed)
Patient's husband called stating that her BP was running very low this morning. States that she is fatigue and having shortness of breath.

## 2018-05-24 ENCOUNTER — Encounter: Payer: Self-pay | Admitting: General Practice

## 2018-05-24 NOTE — Progress Notes (Signed)
[facility] Psychosocial Distress Screening Clinical Social Work  Clinical Social Work was referred by distress screening protocol.  The patient scored a 6 on the Psychosocial Distress Thermometer which indicates moderate distress. Clinical Social Worker contacted patient by phone to assess for distress and other psychosocial needs. "I have really been through it in the past couple of weeks, blood pressure has gone up over the roof, SOB, I can't seem to settle down." Lives w husband, limited family support in this area. Was hoping to have appointment sooner, but care is in process of being transferred to [facility] for radiation.  Has appt on 11/26 to discuss treatment plan.  "Im so tired of waiting, I just want to get it over with.  Encouraged patient to contact outpatient providers in order to address symptoms of severe fatigue, and concern re blood pressure.  Tried to contact cardiologist, but found out he was out sick.  Has not contacted PCP, encouraged to do so.  Suffering from severe fatigue, "I can hardly do anything without being out of breath."  Daughter is unable to help significantly, she is "severe diabetic" and has husband facing joint replacement.  Daughter cannot drive.  Limited family support in the local area.  CSW and patient discussed common feeling and emotions when being diagnosed with cancer, and the importance of support during treatment. CSW informed patient of the support team and support services at [facility]. CSW provided contact information and encouraged patient to call with any questions or concerns.  CSW encouraged patient to go to ED or PCP in order to have current symptoms evaluated on outpatient basis.  Cancer care is being transferred to [facility] for further care.    ONCBCN DISTRESS SCREENING 05/16/2018  Screening Type Initial Screening  Distress experienced in past week (1-10) 6  Practical problem type Housing;Food  Emotional problem type Nervousness/Anxiety;Adjusting to  illness  Spiritual/Religous concerns type Loss of sense of purpose  Physical Problem type Sleep/insomnia;Changes in urination;Tingling hands/feet;Swollen arms/legs  Physician notified of physical symptoms Yes  Referral to clinical psychology No  Referral to clinical social work No  Referral to dietition No  Referral to financial advocate No  Referral to support programs No  Referral to palliative care No    Clinical Social Worker follow up needed: No.  Patient to call if needed.    If yes, follow up plan:  Beverely Pace, [place], LCSW Clinical Social Worker Phone:  [phone]

## 2018-05-29 DIAGNOSIS — R32 Unspecified urinary incontinence: Secondary | ICD-10-CM | POA: Diagnosis not present

## 2018-05-29 DIAGNOSIS — C672 Malignant neoplasm of lateral wall of bladder: Secondary | ICD-10-CM | POA: Diagnosis not present

## 2018-05-29 DIAGNOSIS — C678 Malignant neoplasm of overlapping sites of bladder: Secondary | ICD-10-CM | POA: Diagnosis not present

## 2018-05-29 DIAGNOSIS — Z87891 Personal history of nicotine dependence: Secondary | ICD-10-CM | POA: Diagnosis not present

## 2018-05-29 DIAGNOSIS — Z881 Allergy status to other antibiotic agents status: Secondary | ICD-10-CM | POA: Diagnosis not present

## 2018-05-29 DIAGNOSIS — Z955 Presence of coronary angioplasty implant and graft: Secondary | ICD-10-CM | POA: Diagnosis not present

## 2018-05-29 DIAGNOSIS — D649 Anemia, unspecified: Secondary | ICD-10-CM | POA: Diagnosis not present

## 2018-05-29 DIAGNOSIS — I252 Old myocardial infarction: Secondary | ICD-10-CM | POA: Diagnosis not present

## 2018-05-29 DIAGNOSIS — Z888 Allergy status to other drugs, medicaments and biological substances status: Secondary | ICD-10-CM | POA: Diagnosis not present

## 2018-05-29 DIAGNOSIS — I422 Other hypertrophic cardiomyopathy: Secondary | ICD-10-CM | POA: Diagnosis not present

## 2018-05-29 DIAGNOSIS — K5909 Other constipation: Secondary | ICD-10-CM | POA: Diagnosis not present

## 2018-05-29 DIAGNOSIS — D494 Neoplasm of unspecified behavior of bladder: Secondary | ICD-10-CM | POA: Diagnosis not present

## 2018-05-29 DIAGNOSIS — Z91013 Allergy to seafood: Secondary | ICD-10-CM | POA: Diagnosis not present

## 2018-05-29 DIAGNOSIS — C679 Malignant neoplasm of bladder, unspecified: Secondary | ICD-10-CM | POA: Diagnosis not present

## 2018-05-30 ENCOUNTER — Other Ambulatory Visit (HOSPITAL_COMMUNITY): Payer: Self-pay | Admitting: Oncology

## 2018-05-30 DIAGNOSIS — D494 Neoplasm of unspecified behavior of bladder: Secondary | ICD-10-CM | POA: Diagnosis not present

## 2018-05-30 DIAGNOSIS — D500 Iron deficiency anemia secondary to blood loss (chronic): Secondary | ICD-10-CM | POA: Diagnosis not present

## 2018-05-30 DIAGNOSIS — C679 Malignant neoplasm of bladder, unspecified: Secondary | ICD-10-CM

## 2018-05-30 DIAGNOSIS — C678 Malignant neoplasm of overlapping sites of bladder: Secondary | ICD-10-CM | POA: Diagnosis not present

## 2018-05-30 DIAGNOSIS — R310 Gross hematuria: Secondary | ICD-10-CM | POA: Diagnosis not present

## 2018-06-05 DIAGNOSIS — I422 Other hypertrophic cardiomyopathy: Secondary | ICD-10-CM | POA: Diagnosis not present

## 2018-06-05 DIAGNOSIS — I252 Old myocardial infarction: Secondary | ICD-10-CM | POA: Diagnosis not present

## 2018-06-05 DIAGNOSIS — D649 Anemia, unspecified: Secondary | ICD-10-CM | POA: Diagnosis not present

## 2018-06-05 DIAGNOSIS — Z888 Allergy status to other drugs, medicaments and biological substances status: Secondary | ICD-10-CM | POA: Diagnosis not present

## 2018-06-05 DIAGNOSIS — R32 Unspecified urinary incontinence: Secondary | ICD-10-CM | POA: Diagnosis not present

## 2018-06-05 DIAGNOSIS — Z91013 Allergy to seafood: Secondary | ICD-10-CM | POA: Diagnosis not present

## 2018-06-05 DIAGNOSIS — Z955 Presence of coronary angioplasty implant and graft: Secondary | ICD-10-CM | POA: Diagnosis not present

## 2018-06-05 DIAGNOSIS — K5909 Other constipation: Secondary | ICD-10-CM | POA: Diagnosis not present

## 2018-06-05 DIAGNOSIS — C672 Malignant neoplasm of lateral wall of bladder: Secondary | ICD-10-CM | POA: Diagnosis not present

## 2018-06-05 DIAGNOSIS — Z881 Allergy status to other antibiotic agents status: Secondary | ICD-10-CM | POA: Diagnosis not present

## 2018-06-05 DIAGNOSIS — Z87891 Personal history of nicotine dependence: Secondary | ICD-10-CM | POA: Diagnosis not present

## 2018-06-11 ENCOUNTER — Encounter (HOSPITAL_COMMUNITY)
Admission: RE | Admit: 2018-06-11 | Discharge: 2018-06-11 | Disposition: A | Discharge status: Home / Self Care | Payer: Medicare Other | Source: Ambulatory Visit | Attending: Oncology | Admitting: Oncology

## 2018-06-11 DIAGNOSIS — C672 Malignant neoplasm of lateral wall of bladder: Secondary | ICD-10-CM | POA: Diagnosis not present

## 2018-06-11 DIAGNOSIS — Z955 Presence of coronary angioplasty implant and graft: Secondary | ICD-10-CM | POA: Diagnosis not present

## 2018-06-11 DIAGNOSIS — I252 Old myocardial infarction: Secondary | ICD-10-CM | POA: Diagnosis not present

## 2018-06-11 DIAGNOSIS — C679 Malignant neoplasm of bladder, unspecified: Secondary | ICD-10-CM | POA: Diagnosis not present

## 2018-06-11 DIAGNOSIS — I422 Other hypertrophic cardiomyopathy: Secondary | ICD-10-CM | POA: Diagnosis not present

## 2018-06-11 DIAGNOSIS — Z87891 Personal history of nicotine dependence: Secondary | ICD-10-CM | POA: Diagnosis not present

## 2018-06-11 DIAGNOSIS — D649 Anemia, unspecified: Secondary | ICD-10-CM | POA: Diagnosis not present

## 2018-06-11 LAB — GLUCOSE, CAPILLARY: Glucose-Capillary: 92 mg/dL (ref 70–99)

## 2018-06-11 MED ORDER — FLUDEOXYGLUCOSE F - 18 (FDG) INJECTION
6.1700 | Freq: Once | INTRAVENOUS | Status: AC | PRN
  Administered 2018-06-11: 6.17 via INTRAVENOUS

## 2018-06-12 DIAGNOSIS — Z955 Presence of coronary angioplasty implant and graft: Secondary | ICD-10-CM | POA: Diagnosis not present

## 2018-06-12 DIAGNOSIS — Z87891 Personal history of nicotine dependence: Secondary | ICD-10-CM | POA: Diagnosis not present

## 2018-06-12 DIAGNOSIS — C672 Malignant neoplasm of lateral wall of bladder: Secondary | ICD-10-CM | POA: Diagnosis not present

## 2018-06-12 DIAGNOSIS — D649 Anemia, unspecified: Secondary | ICD-10-CM | POA: Diagnosis not present

## 2018-06-12 DIAGNOSIS — I422 Other hypertrophic cardiomyopathy: Secondary | ICD-10-CM | POA: Diagnosis not present

## 2018-06-12 DIAGNOSIS — I252 Old myocardial infarction: Secondary | ICD-10-CM | POA: Diagnosis not present

## 2018-06-15 DIAGNOSIS — C678 Malignant neoplasm of overlapping sites of bladder: Secondary | ICD-10-CM | POA: Diagnosis not present

## 2018-06-19 DIAGNOSIS — L821 Other seborrheic keratosis: Secondary | ICD-10-CM | POA: Diagnosis not present

## 2018-06-19 DIAGNOSIS — L820 Inflamed seborrheic keratosis: Secondary | ICD-10-CM | POA: Diagnosis not present

## 2018-06-28 DIAGNOSIS — Z881 Allergy status to other antibiotic agents status: Secondary | ICD-10-CM | POA: Diagnosis not present

## 2018-06-28 DIAGNOSIS — R32 Unspecified urinary incontinence: Secondary | ICD-10-CM | POA: Diagnosis not present

## 2018-06-28 DIAGNOSIS — I252 Old myocardial infarction: Secondary | ICD-10-CM | POA: Diagnosis not present

## 2018-06-28 DIAGNOSIS — I422 Other hypertrophic cardiomyopathy: Secondary | ICD-10-CM | POA: Diagnosis not present

## 2018-06-28 DIAGNOSIS — K5909 Other constipation: Secondary | ICD-10-CM | POA: Diagnosis not present

## 2018-06-28 DIAGNOSIS — C672 Malignant neoplasm of lateral wall of bladder: Secondary | ICD-10-CM | POA: Diagnosis not present

## 2018-06-28 DIAGNOSIS — Z888 Allergy status to other drugs, medicaments and biological substances status: Secondary | ICD-10-CM | POA: Diagnosis not present

## 2018-06-28 DIAGNOSIS — Z91013 Allergy to seafood: Secondary | ICD-10-CM | POA: Diagnosis not present

## 2018-06-28 DIAGNOSIS — Z87891 Personal history of nicotine dependence: Secondary | ICD-10-CM | POA: Diagnosis not present

## 2018-06-28 DIAGNOSIS — Z955 Presence of coronary angioplasty implant and graft: Secondary | ICD-10-CM | POA: Diagnosis not present

## 2018-06-28 DIAGNOSIS — D649 Anemia, unspecified: Secondary | ICD-10-CM | POA: Diagnosis not present

## 2018-06-29 DIAGNOSIS — D649 Anemia, unspecified: Secondary | ICD-10-CM | POA: Diagnosis not present

## 2018-06-29 DIAGNOSIS — Z955 Presence of coronary angioplasty implant and graft: Secondary | ICD-10-CM | POA: Diagnosis not present

## 2018-06-29 DIAGNOSIS — Z87891 Personal history of nicotine dependence: Secondary | ICD-10-CM | POA: Diagnosis not present

## 2018-06-29 DIAGNOSIS — I422 Other hypertrophic cardiomyopathy: Secondary | ICD-10-CM | POA: Diagnosis not present

## 2018-06-29 DIAGNOSIS — C672 Malignant neoplasm of lateral wall of bladder: Secondary | ICD-10-CM | POA: Diagnosis not present

## 2018-06-29 DIAGNOSIS — I252 Old myocardial infarction: Secondary | ICD-10-CM | POA: Diagnosis not present

## 2018-07-02 DIAGNOSIS — Z955 Presence of coronary angioplasty implant and graft: Secondary | ICD-10-CM | POA: Diagnosis not present

## 2018-07-02 DIAGNOSIS — I252 Old myocardial infarction: Secondary | ICD-10-CM | POA: Diagnosis not present

## 2018-07-02 DIAGNOSIS — D649 Anemia, unspecified: Secondary | ICD-10-CM | POA: Diagnosis not present

## 2018-07-02 DIAGNOSIS — C672 Malignant neoplasm of lateral wall of bladder: Secondary | ICD-10-CM | POA: Diagnosis not present

## 2018-07-02 DIAGNOSIS — Z87891 Personal history of nicotine dependence: Secondary | ICD-10-CM | POA: Diagnosis not present

## 2018-07-02 DIAGNOSIS — I422 Other hypertrophic cardiomyopathy: Secondary | ICD-10-CM | POA: Diagnosis not present

## 2018-07-03 DIAGNOSIS — I252 Old myocardial infarction: Secondary | ICD-10-CM | POA: Diagnosis not present

## 2018-07-03 DIAGNOSIS — Z955 Presence of coronary angioplasty implant and graft: Secondary | ICD-10-CM | POA: Diagnosis not present

## 2018-07-03 DIAGNOSIS — D649 Anemia, unspecified: Secondary | ICD-10-CM | POA: Diagnosis not present

## 2018-07-03 DIAGNOSIS — C672 Malignant neoplasm of lateral wall of bladder: Secondary | ICD-10-CM | POA: Diagnosis not present

## 2018-07-03 DIAGNOSIS — I422 Other hypertrophic cardiomyopathy: Secondary | ICD-10-CM | POA: Diagnosis not present

## 2018-07-03 DIAGNOSIS — Z87891 Personal history of nicotine dependence: Secondary | ICD-10-CM | POA: Diagnosis not present

## 2018-07-04 DIAGNOSIS — Z9981 Dependence on supplemental oxygen: Secondary | ICD-10-CM

## 2018-07-04 HISTORY — DX: Dependence on supplemental oxygen: Z99.81

## 2018-07-05 DIAGNOSIS — Z955 Presence of coronary angioplasty implant and graft: Secondary | ICD-10-CM | POA: Diagnosis not present

## 2018-07-05 DIAGNOSIS — Z87891 Personal history of nicotine dependence: Secondary | ICD-10-CM | POA: Diagnosis not present

## 2018-07-05 DIAGNOSIS — D649 Anemia, unspecified: Secondary | ICD-10-CM | POA: Diagnosis not present

## 2018-07-05 DIAGNOSIS — Z91013 Allergy to seafood: Secondary | ICD-10-CM | POA: Diagnosis not present

## 2018-07-05 DIAGNOSIS — I252 Old myocardial infarction: Secondary | ICD-10-CM | POA: Diagnosis not present

## 2018-07-05 DIAGNOSIS — Z881 Allergy status to other antibiotic agents status: Secondary | ICD-10-CM | POA: Diagnosis not present

## 2018-07-05 DIAGNOSIS — K5909 Other constipation: Secondary | ICD-10-CM | POA: Diagnosis not present

## 2018-07-05 DIAGNOSIS — R32 Unspecified urinary incontinence: Secondary | ICD-10-CM | POA: Diagnosis not present

## 2018-07-05 DIAGNOSIS — C672 Malignant neoplasm of lateral wall of bladder: Secondary | ICD-10-CM | POA: Diagnosis not present

## 2018-07-05 DIAGNOSIS — Z888 Allergy status to other drugs, medicaments and biological substances status: Secondary | ICD-10-CM | POA: Diagnosis not present

## 2018-07-05 DIAGNOSIS — I422 Other hypertrophic cardiomyopathy: Secondary | ICD-10-CM | POA: Diagnosis not present

## 2018-07-06 DIAGNOSIS — Z87891 Personal history of nicotine dependence: Secondary | ICD-10-CM | POA: Diagnosis not present

## 2018-07-06 DIAGNOSIS — D649 Anemia, unspecified: Secondary | ICD-10-CM | POA: Diagnosis not present

## 2018-07-06 DIAGNOSIS — C672 Malignant neoplasm of lateral wall of bladder: Secondary | ICD-10-CM | POA: Diagnosis not present

## 2018-07-06 DIAGNOSIS — I252 Old myocardial infarction: Secondary | ICD-10-CM | POA: Diagnosis not present

## 2018-07-06 DIAGNOSIS — Z955 Presence of coronary angioplasty implant and graft: Secondary | ICD-10-CM | POA: Diagnosis not present

## 2018-07-06 DIAGNOSIS — I422 Other hypertrophic cardiomyopathy: Secondary | ICD-10-CM | POA: Diagnosis not present

## 2018-07-09 DIAGNOSIS — Z955 Presence of coronary angioplasty implant and graft: Secondary | ICD-10-CM | POA: Diagnosis not present

## 2018-07-09 DIAGNOSIS — I252 Old myocardial infarction: Secondary | ICD-10-CM | POA: Diagnosis not present

## 2018-07-09 DIAGNOSIS — Z87891 Personal history of nicotine dependence: Secondary | ICD-10-CM | POA: Diagnosis not present

## 2018-07-09 DIAGNOSIS — D649 Anemia, unspecified: Secondary | ICD-10-CM | POA: Diagnosis not present

## 2018-07-09 DIAGNOSIS — I422 Other hypertrophic cardiomyopathy: Secondary | ICD-10-CM | POA: Diagnosis not present

## 2018-07-09 DIAGNOSIS — C672 Malignant neoplasm of lateral wall of bladder: Secondary | ICD-10-CM | POA: Diagnosis not present

## 2018-07-10 DIAGNOSIS — C672 Malignant neoplasm of lateral wall of bladder: Secondary | ICD-10-CM | POA: Diagnosis not present

## 2018-07-10 DIAGNOSIS — Z87891 Personal history of nicotine dependence: Secondary | ICD-10-CM | POA: Diagnosis not present

## 2018-07-10 DIAGNOSIS — Z955 Presence of coronary angioplasty implant and graft: Secondary | ICD-10-CM | POA: Diagnosis not present

## 2018-07-10 DIAGNOSIS — D649 Anemia, unspecified: Secondary | ICD-10-CM | POA: Diagnosis not present

## 2018-07-10 DIAGNOSIS — I422 Other hypertrophic cardiomyopathy: Secondary | ICD-10-CM | POA: Diagnosis not present

## 2018-07-10 DIAGNOSIS — I252 Old myocardial infarction: Secondary | ICD-10-CM | POA: Diagnosis not present

## 2018-07-11 DIAGNOSIS — Z955 Presence of coronary angioplasty implant and graft: Secondary | ICD-10-CM | POA: Diagnosis not present

## 2018-07-11 DIAGNOSIS — I252 Old myocardial infarction: Secondary | ICD-10-CM | POA: Diagnosis not present

## 2018-07-11 DIAGNOSIS — D649 Anemia, unspecified: Secondary | ICD-10-CM | POA: Diagnosis not present

## 2018-07-11 DIAGNOSIS — Z87891 Personal history of nicotine dependence: Secondary | ICD-10-CM | POA: Diagnosis not present

## 2018-07-11 DIAGNOSIS — I422 Other hypertrophic cardiomyopathy: Secondary | ICD-10-CM | POA: Diagnosis not present

## 2018-07-11 DIAGNOSIS — C672 Malignant neoplasm of lateral wall of bladder: Secondary | ICD-10-CM | POA: Diagnosis not present

## 2018-07-12 DIAGNOSIS — C672 Malignant neoplasm of lateral wall of bladder: Secondary | ICD-10-CM | POA: Diagnosis not present

## 2018-07-12 DIAGNOSIS — D649 Anemia, unspecified: Secondary | ICD-10-CM | POA: Diagnosis not present

## 2018-07-12 DIAGNOSIS — I422 Other hypertrophic cardiomyopathy: Secondary | ICD-10-CM | POA: Diagnosis not present

## 2018-07-12 DIAGNOSIS — Z87891 Personal history of nicotine dependence: Secondary | ICD-10-CM | POA: Diagnosis not present

## 2018-07-12 DIAGNOSIS — Z955 Presence of coronary angioplasty implant and graft: Secondary | ICD-10-CM | POA: Diagnosis not present

## 2018-07-12 DIAGNOSIS — I252 Old myocardial infarction: Secondary | ICD-10-CM | POA: Diagnosis not present

## 2018-07-13 DIAGNOSIS — I252 Old myocardial infarction: Secondary | ICD-10-CM | POA: Diagnosis not present

## 2018-07-13 DIAGNOSIS — C672 Malignant neoplasm of lateral wall of bladder: Secondary | ICD-10-CM | POA: Diagnosis not present

## 2018-07-13 DIAGNOSIS — Z955 Presence of coronary angioplasty implant and graft: Secondary | ICD-10-CM | POA: Diagnosis not present

## 2018-07-13 DIAGNOSIS — Z87891 Personal history of nicotine dependence: Secondary | ICD-10-CM | POA: Diagnosis not present

## 2018-07-13 DIAGNOSIS — I422 Other hypertrophic cardiomyopathy: Secondary | ICD-10-CM | POA: Diagnosis not present

## 2018-07-13 DIAGNOSIS — D649 Anemia, unspecified: Secondary | ICD-10-CM | POA: Diagnosis not present

## 2018-07-16 DIAGNOSIS — C672 Malignant neoplasm of lateral wall of bladder: Secondary | ICD-10-CM | POA: Diagnosis not present

## 2018-07-16 DIAGNOSIS — D649 Anemia, unspecified: Secondary | ICD-10-CM | POA: Diagnosis not present

## 2018-07-16 DIAGNOSIS — I422 Other hypertrophic cardiomyopathy: Secondary | ICD-10-CM | POA: Diagnosis not present

## 2018-07-16 DIAGNOSIS — Z87891 Personal history of nicotine dependence: Secondary | ICD-10-CM | POA: Diagnosis not present

## 2018-07-16 DIAGNOSIS — Z955 Presence of coronary angioplasty implant and graft: Secondary | ICD-10-CM | POA: Diagnosis not present

## 2018-07-16 DIAGNOSIS — I252 Old myocardial infarction: Secondary | ICD-10-CM | POA: Diagnosis not present

## 2018-07-17 DIAGNOSIS — C672 Malignant neoplasm of lateral wall of bladder: Secondary | ICD-10-CM | POA: Diagnosis not present

## 2018-07-17 DIAGNOSIS — Z87891 Personal history of nicotine dependence: Secondary | ICD-10-CM | POA: Diagnosis not present

## 2018-07-17 DIAGNOSIS — D649 Anemia, unspecified: Secondary | ICD-10-CM | POA: Diagnosis not present

## 2018-07-17 DIAGNOSIS — Z955 Presence of coronary angioplasty implant and graft: Secondary | ICD-10-CM | POA: Diagnosis not present

## 2018-07-17 DIAGNOSIS — I422 Other hypertrophic cardiomyopathy: Secondary | ICD-10-CM | POA: Diagnosis not present

## 2018-07-17 DIAGNOSIS — I252 Old myocardial infarction: Secondary | ICD-10-CM | POA: Diagnosis not present

## 2018-07-18 DIAGNOSIS — Z955 Presence of coronary angioplasty implant and graft: Secondary | ICD-10-CM | POA: Diagnosis not present

## 2018-07-18 DIAGNOSIS — I422 Other hypertrophic cardiomyopathy: Secondary | ICD-10-CM | POA: Diagnosis not present

## 2018-07-18 DIAGNOSIS — D649 Anemia, unspecified: Secondary | ICD-10-CM | POA: Diagnosis not present

## 2018-07-18 DIAGNOSIS — I252 Old myocardial infarction: Secondary | ICD-10-CM | POA: Diagnosis not present

## 2018-07-18 DIAGNOSIS — C672 Malignant neoplasm of lateral wall of bladder: Secondary | ICD-10-CM | POA: Diagnosis not present

## 2018-07-18 DIAGNOSIS — Z87891 Personal history of nicotine dependence: Secondary | ICD-10-CM | POA: Diagnosis not present

## 2018-07-19 DIAGNOSIS — I422 Other hypertrophic cardiomyopathy: Secondary | ICD-10-CM | POA: Diagnosis not present

## 2018-07-19 DIAGNOSIS — Z87891 Personal history of nicotine dependence: Secondary | ICD-10-CM | POA: Diagnosis not present

## 2018-07-19 DIAGNOSIS — D649 Anemia, unspecified: Secondary | ICD-10-CM | POA: Diagnosis not present

## 2018-07-19 DIAGNOSIS — I252 Old myocardial infarction: Secondary | ICD-10-CM | POA: Diagnosis not present

## 2018-07-19 DIAGNOSIS — C672 Malignant neoplasm of lateral wall of bladder: Secondary | ICD-10-CM | POA: Diagnosis not present

## 2018-07-19 DIAGNOSIS — Z955 Presence of coronary angioplasty implant and graft: Secondary | ICD-10-CM | POA: Diagnosis not present

## 2018-07-20 DIAGNOSIS — Z955 Presence of coronary angioplasty implant and graft: Secondary | ICD-10-CM | POA: Diagnosis not present

## 2018-07-20 DIAGNOSIS — Z87891 Personal history of nicotine dependence: Secondary | ICD-10-CM | POA: Diagnosis not present

## 2018-07-20 DIAGNOSIS — I422 Other hypertrophic cardiomyopathy: Secondary | ICD-10-CM | POA: Diagnosis not present

## 2018-07-20 DIAGNOSIS — D649 Anemia, unspecified: Secondary | ICD-10-CM | POA: Diagnosis not present

## 2018-07-20 DIAGNOSIS — C672 Malignant neoplasm of lateral wall of bladder: Secondary | ICD-10-CM | POA: Diagnosis not present

## 2018-07-20 DIAGNOSIS — I252 Old myocardial infarction: Secondary | ICD-10-CM | POA: Diagnosis not present

## 2018-07-24 DIAGNOSIS — I252 Old myocardial infarction: Secondary | ICD-10-CM | POA: Diagnosis not present

## 2018-07-24 DIAGNOSIS — Z955 Presence of coronary angioplasty implant and graft: Secondary | ICD-10-CM | POA: Diagnosis not present

## 2018-07-24 DIAGNOSIS — D649 Anemia, unspecified: Secondary | ICD-10-CM | POA: Diagnosis not present

## 2018-07-24 DIAGNOSIS — Z87891 Personal history of nicotine dependence: Secondary | ICD-10-CM | POA: Diagnosis not present

## 2018-07-24 DIAGNOSIS — I422 Other hypertrophic cardiomyopathy: Secondary | ICD-10-CM | POA: Diagnosis not present

## 2018-07-24 DIAGNOSIS — C672 Malignant neoplasm of lateral wall of bladder: Secondary | ICD-10-CM | POA: Diagnosis not present

## 2018-07-25 DIAGNOSIS — I252 Old myocardial infarction: Secondary | ICD-10-CM | POA: Diagnosis not present

## 2018-07-25 DIAGNOSIS — D649 Anemia, unspecified: Secondary | ICD-10-CM | POA: Diagnosis not present

## 2018-07-25 DIAGNOSIS — Z955 Presence of coronary angioplasty implant and graft: Secondary | ICD-10-CM | POA: Diagnosis not present

## 2018-07-25 DIAGNOSIS — C672 Malignant neoplasm of lateral wall of bladder: Secondary | ICD-10-CM | POA: Diagnosis not present

## 2018-07-25 DIAGNOSIS — Z87891 Personal history of nicotine dependence: Secondary | ICD-10-CM | POA: Diagnosis not present

## 2018-07-25 DIAGNOSIS — I422 Other hypertrophic cardiomyopathy: Secondary | ICD-10-CM | POA: Diagnosis not present

## 2018-07-26 DIAGNOSIS — I422 Other hypertrophic cardiomyopathy: Secondary | ICD-10-CM | POA: Diagnosis not present

## 2018-07-26 DIAGNOSIS — Z87891 Personal history of nicotine dependence: Secondary | ICD-10-CM | POA: Diagnosis not present

## 2018-07-26 DIAGNOSIS — D649 Anemia, unspecified: Secondary | ICD-10-CM | POA: Diagnosis not present

## 2018-07-26 DIAGNOSIS — Z955 Presence of coronary angioplasty implant and graft: Secondary | ICD-10-CM | POA: Diagnosis not present

## 2018-07-26 DIAGNOSIS — I252 Old myocardial infarction: Secondary | ICD-10-CM | POA: Diagnosis not present

## 2018-07-26 DIAGNOSIS — C672 Malignant neoplasm of lateral wall of bladder: Secondary | ICD-10-CM | POA: Diagnosis not present

## 2018-07-27 DIAGNOSIS — Z955 Presence of coronary angioplasty implant and graft: Secondary | ICD-10-CM | POA: Diagnosis not present

## 2018-07-27 DIAGNOSIS — C672 Malignant neoplasm of lateral wall of bladder: Secondary | ICD-10-CM | POA: Diagnosis not present

## 2018-07-27 DIAGNOSIS — I252 Old myocardial infarction: Secondary | ICD-10-CM | POA: Diagnosis not present

## 2018-07-27 DIAGNOSIS — D649 Anemia, unspecified: Secondary | ICD-10-CM | POA: Diagnosis not present

## 2018-07-27 DIAGNOSIS — Z87891 Personal history of nicotine dependence: Secondary | ICD-10-CM | POA: Diagnosis not present

## 2018-07-27 DIAGNOSIS — I422 Other hypertrophic cardiomyopathy: Secondary | ICD-10-CM | POA: Diagnosis not present

## 2018-07-30 DIAGNOSIS — Z955 Presence of coronary angioplasty implant and graft: Secondary | ICD-10-CM | POA: Diagnosis not present

## 2018-07-30 DIAGNOSIS — C672 Malignant neoplasm of lateral wall of bladder: Secondary | ICD-10-CM | POA: Diagnosis not present

## 2018-07-30 DIAGNOSIS — Z87891 Personal history of nicotine dependence: Secondary | ICD-10-CM | POA: Diagnosis not present

## 2018-07-30 DIAGNOSIS — D649 Anemia, unspecified: Secondary | ICD-10-CM | POA: Diagnosis not present

## 2018-07-30 DIAGNOSIS — I252 Old myocardial infarction: Secondary | ICD-10-CM | POA: Diagnosis not present

## 2018-07-30 DIAGNOSIS — I422 Other hypertrophic cardiomyopathy: Secondary | ICD-10-CM | POA: Diagnosis not present

## 2018-07-31 DIAGNOSIS — I422 Other hypertrophic cardiomyopathy: Secondary | ICD-10-CM | POA: Diagnosis not present

## 2018-07-31 DIAGNOSIS — Z87891 Personal history of nicotine dependence: Secondary | ICD-10-CM | POA: Diagnosis not present

## 2018-07-31 DIAGNOSIS — D649 Anemia, unspecified: Secondary | ICD-10-CM | POA: Diagnosis not present

## 2018-07-31 DIAGNOSIS — Z955 Presence of coronary angioplasty implant and graft: Secondary | ICD-10-CM | POA: Diagnosis not present

## 2018-07-31 DIAGNOSIS — C672 Malignant neoplasm of lateral wall of bladder: Secondary | ICD-10-CM | POA: Diagnosis not present

## 2018-07-31 DIAGNOSIS — I252 Old myocardial infarction: Secondary | ICD-10-CM | POA: Diagnosis not present

## 2018-08-01 DIAGNOSIS — C672 Malignant neoplasm of lateral wall of bladder: Secondary | ICD-10-CM | POA: Diagnosis not present

## 2018-08-01 DIAGNOSIS — Z955 Presence of coronary angioplasty implant and graft: Secondary | ICD-10-CM | POA: Diagnosis not present

## 2018-08-01 DIAGNOSIS — Z87891 Personal history of nicotine dependence: Secondary | ICD-10-CM | POA: Diagnosis not present

## 2018-08-01 DIAGNOSIS — I422 Other hypertrophic cardiomyopathy: Secondary | ICD-10-CM | POA: Diagnosis not present

## 2018-08-01 DIAGNOSIS — D649 Anemia, unspecified: Secondary | ICD-10-CM | POA: Diagnosis not present

## 2018-08-01 DIAGNOSIS — I252 Old myocardial infarction: Secondary | ICD-10-CM | POA: Diagnosis not present

## 2018-08-02 DIAGNOSIS — I252 Old myocardial infarction: Secondary | ICD-10-CM | POA: Diagnosis not present

## 2018-08-02 DIAGNOSIS — D649 Anemia, unspecified: Secondary | ICD-10-CM | POA: Diagnosis not present

## 2018-08-02 DIAGNOSIS — Z87891 Personal history of nicotine dependence: Secondary | ICD-10-CM | POA: Diagnosis not present

## 2018-08-02 DIAGNOSIS — I422 Other hypertrophic cardiomyopathy: Secondary | ICD-10-CM | POA: Diagnosis not present

## 2018-08-02 DIAGNOSIS — C672 Malignant neoplasm of lateral wall of bladder: Secondary | ICD-10-CM | POA: Diagnosis not present

## 2018-08-02 DIAGNOSIS — Z955 Presence of coronary angioplasty implant and graft: Secondary | ICD-10-CM | POA: Diagnosis not present

## 2018-08-03 DIAGNOSIS — Z955 Presence of coronary angioplasty implant and graft: Secondary | ICD-10-CM | POA: Diagnosis not present

## 2018-08-03 DIAGNOSIS — D649 Anemia, unspecified: Secondary | ICD-10-CM | POA: Diagnosis not present

## 2018-08-03 DIAGNOSIS — I422 Other hypertrophic cardiomyopathy: Secondary | ICD-10-CM | POA: Diagnosis not present

## 2018-08-03 DIAGNOSIS — I252 Old myocardial infarction: Secondary | ICD-10-CM | POA: Diagnosis not present

## 2018-08-03 DIAGNOSIS — C672 Malignant neoplasm of lateral wall of bladder: Secondary | ICD-10-CM | POA: Diagnosis not present

## 2018-08-03 DIAGNOSIS — Z87891 Personal history of nicotine dependence: Secondary | ICD-10-CM | POA: Diagnosis not present

## 2018-08-07 ENCOUNTER — Ambulatory Visit (INDEPENDENT_AMBULATORY_CARE_PROVIDER_SITE_OTHER): Payer: Medicare Other | Admitting: Cardiovascular Disease

## 2018-08-07 ENCOUNTER — Encounter: Payer: Self-pay | Admitting: Cardiovascular Disease

## 2018-08-07 VITALS — BP 126/60 | HR 68 | Ht 64.0 in | Wt 117.0 lb

## 2018-08-07 DIAGNOSIS — R001 Bradycardia, unspecified: Secondary | ICD-10-CM

## 2018-08-07 DIAGNOSIS — I25118 Atherosclerotic heart disease of native coronary artery with other forms of angina pectoris: Secondary | ICD-10-CM | POA: Diagnosis not present

## 2018-08-07 DIAGNOSIS — I10 Essential (primary) hypertension: Secondary | ICD-10-CM

## 2018-08-07 DIAGNOSIS — Z87891 Personal history of nicotine dependence: Secondary | ICD-10-CM | POA: Diagnosis not present

## 2018-08-07 DIAGNOSIS — I6523 Occlusion and stenosis of bilateral carotid arteries: Secondary | ICD-10-CM | POA: Diagnosis not present

## 2018-08-07 DIAGNOSIS — Z881 Allergy status to other antibiotic agents status: Secondary | ICD-10-CM | POA: Diagnosis not present

## 2018-08-07 DIAGNOSIS — I252 Old myocardial infarction: Secondary | ICD-10-CM | POA: Diagnosis not present

## 2018-08-07 DIAGNOSIS — E785 Hyperlipidemia, unspecified: Secondary | ICD-10-CM | POA: Diagnosis not present

## 2018-08-07 DIAGNOSIS — K5909 Other constipation: Secondary | ICD-10-CM | POA: Diagnosis not present

## 2018-08-07 DIAGNOSIS — Z955 Presence of coronary angioplasty implant and graft: Secondary | ICD-10-CM | POA: Diagnosis not present

## 2018-08-07 DIAGNOSIS — D649 Anemia, unspecified: Secondary | ICD-10-CM | POA: Diagnosis not present

## 2018-08-07 DIAGNOSIS — C672 Malignant neoplasm of lateral wall of bladder: Secondary | ICD-10-CM | POA: Diagnosis not present

## 2018-08-07 DIAGNOSIS — I422 Other hypertrophic cardiomyopathy: Secondary | ICD-10-CM | POA: Diagnosis not present

## 2018-08-07 DIAGNOSIS — Z91013 Allergy to seafood: Secondary | ICD-10-CM | POA: Diagnosis not present

## 2018-08-07 DIAGNOSIS — Z888 Allergy status to other drugs, medicaments and biological substances status: Secondary | ICD-10-CM | POA: Diagnosis not present

## 2018-08-07 DIAGNOSIS — R32 Unspecified urinary incontinence: Secondary | ICD-10-CM | POA: Diagnosis not present

## 2018-08-07 NOTE — Progress Notes (Signed)
SUBJECTIVE: The patient presents for routine follow-up.  Event monitoring in October 2019 showed no evidence of symptomatic bradycardia.  She then underwent a low risk nuclear stress test on 04/05/2018.  The patient denies any symptoms of chest pain, palpitations, shortness of breath, lightheadedness, dizziness, leg swelling, orthopnea, PND, and syncope.  She received a treatment for bladder cancer this morning.    Review of Systems: As per "subjective", otherwise negative.  Allergies  Allergen Reactions  . Brilinta [Ticagrelor] Shortness Of Breath, Nausea And Vomiting and Other (See Comments)    dyspnea  . Iodine Nausea And Vomiting and Other (See Comments)    AND DIZZY  . Metoprolol Other (See Comments)    Bradycardia  . Shellfish Allergy Nausea And Vomiting and Other (See Comments)    Unsteady gait/  PER PT ONLY SHRIMP  . Streptomycin Nausea And Vomiting  . Iodinated Diagnostic Agents Nausea Only    Current Outpatient Medications  Medication Sig Dispense Refill  . amLODipine (NORVASC) 5 MG tablet Take 2.5 mg by mouth every morning. for high blood pressure  3  . Ascorbic Acid (VITAMIN C WITH ROSE HIPS) 500 MG tablet Take 500 mg by mouth daily.    Marland Kitchen aspirin EC 81 MG tablet Take 81 mg by mouth daily.    Marland Kitchen atorvastatin (LIPITOR) 10 MG tablet Take 10 mg by mouth every morning.   3  . Biotin 10000 MCG TABS Take 10,000 mcg by mouth 2 (two) times daily.     . Calcium Carb-Cholecalciferol (CALCIUM 600+D) 600-800 MG-UNIT TABS Take 1 tablet by mouth 2 (two) times daily.    . carbamide peroxide (E-R-O EAR WAX REMOVAL SYSTEM) 6.5 % OTIC solution Place 5 drops into both ears daily as needed (ear wax).    . cholecalciferol (VITAMIN D) 400 units TABS tablet Take 800 Units by mouth daily.    . Ferrous Sulfate 27 MG TABS Take 27 mg by mouth daily.    . isosorbide mononitrate (IMDUR) 30 MG 24 hr tablet Take 30 mg by mouth every morning.   3  . Meclizine HCl (TRAVEL SICKNESS) 25 MG CHEW  Chew 50 mg by mouth daily as needed (dizziness).    . Mirabegron (MYRBETRIQ PO) Take by mouth.     No current facility-administered medications for this visit.     Past Medical History:  Diagnosis Date  . Arthritis    hands  . Benign neoplasm of pituitary gland (HCC)    per last mri 01-30-2015  slight decrease macro adenoma  . Bladder tumor   . Borderline diabetes mellitus   . Cardiogenic shock ([place])    a. 05/2011 requiring pressor therapy.  . Carotid artery disease ([place])    per last duplex in epic 05-06-2015   bilateral ICA 1-39%  . Constipation   . Coronary artery disease cardiologist-  dr Bronson Ing   a. NSTEMI 05/2011 s/p DES to LAD, with ?Takotsubo distribution of cardiomyopathy at that time with cardiogenic shock. b. NSTEMI 06/2011 ?spasm - stable CAD, normal EF. c. Patent LAD stent, nonobst CAD by cath 08/20/12 with normal EF. d. Hx dyspnea with Brilinta. e. patent stent by cath in 01/2015  . DOE (dyspnea on exertion)   . Dyspnea    with exertion and also noted some at rest  . Erosive gastritis   . Fatigue   . GERD (gastroesophageal reflux disease)   . Heart murmur   . Hematuria    passes blood clots occ  . Hemorrhoids   .  Hiatal hernia   . History of gastritis 05/2014   w/ erosions  . History of GI bleed 05/2015   s/p  EGD with ablation duodenal polyp and gastric polypectomy with post gi bleed , s/p clipping  . History of non-ST elevation myocardial infarction (NSTEMI) 05/11/2011   w/ cardiogenic shock requiring pressor therapy,  and s/p  PCI and DES to LAD  . History of skin cancer   . HOH (hard of hearing)    bilateral wears hearing aides  . Hyperlipidemia   . Hypertension   . Hypertrophic cardiomyopathy ([place])    a. echo 08/2012: focal basal septal hypertrophy, mildly elevated gradient 43mmHg across LVOT.  . Iron deficiency anemia due to chronic blood loss    hemotologist --- dr Lauree Chandler @ [facility] office in [place],, hx IV iron infusions  . Melanoma ([place])      nose  . Mild obstructive sleep apnea    per pt no cpap recommendation  . Myocardial infarction ([place])   . Nasal polyp   . Neuropathy, peripheral   . Nocturia more than twice per night   . OAB (overactive bladder)   . Osteoporosis   . Pneumonia    childhood  . Polyp of stomach and duodenum   . Poor circulation    lower ext bil  . Pre-diabetes   . S/P drug eluting coronary stent placement 05/12/2011   to mid LAD  . Scoliosis   . Stress-induced cardiomyopathy 05/2011   a. ?Takotubso cardiomyopathy - distribution was different than that of LAD stenosis.  Marland Kitchen Unsteady gait   . Urinary incontinence   . Vertigo, intermittent   . Vitamin D deficiency   . Wears hearing aid in both ears     Past Surgical History:  Procedure Laterality Date  . APPENDECTOMY  1953  . CARDIAC CATHETERIZATION  06/22/2011   patent LAD stent. Normal EF  . CARDIAC CATHETERIZATION  08/2012   Patent LAD stent with normal ejection fraction.  Marland Kitchen CARDIAC CATHETERIZATION N/A 01/19/2015   Procedure: Right/Left Heart Cath and Coronary Angiography;  Surgeon: Jettie Booze, MD;  Location: [facility];  Service: Cardiovascular;  Laterality: N/A;  . CARDIOVASCULAR STRESS TEST  11-26-2014   dr Bronson Ing   Low risk nuclear study w/ no ischemia or infarct/  normal LV function and wall motion , ef 72%  . CATARACT EXTRACTION W/ INTRAOCULAR LENS  IMPLANT, BILATERAL  2009  approx.  . COLONOSCOPY    . CORONARY ANGIOPLASTY WITH STENT PLACEMENT  05-12-2011  dr Shelva Majestic   mid LAD: 2.5 X 16 mm Promus DES for 85-90% stenosis/  pCFx 30-40%, diagonal 20%, ef 40-45%/ ascending aorta stenosis 36mm gradient/  hypocontratility involve distal anterolateral apical and distal inferior wall  . ESOPHAGOGASTRODUODENOSCOPY N/A 05/23/2014   Procedure: ESOPHAGOGASTRODUODENOSCOPY (EGD);  Surgeon: Rogene Houston, MD;  [facility];  Service: Endoscopy;  Laterality: N/A;  230 - moved to 1:05 - Ann notified pt  .  ESOPHAGOGASTRODUODENOSCOPY N/A 05/20/2015   Procedure: ESOPHAGOGASTRODUODENOSCOPY (EGD);  Surgeon: Rogene Houston, MD;  [facility];  Service: Endoscopy;  Laterality: N/A;  1:00  . ESOPHAGOGASTRODUODENOSCOPY N/A 05/28/2015   Procedure: ESOPHAGOGASTRODUODENOSCOPY (EGD);  Surgeon: Rogene Houston, MD;  [facility];  Service: Endoscopy;  Laterality: N/A;  . GIVENS CAPSULE STUDY N/A 05/12/2014   Procedure: GIVENS CAPSULE STUDY;  Surgeon: Rogene Houston, MD;  [facility];  Service: Endoscopy;  Laterality: N/A;  730  .  LEFT HEART CATHETERIZATION WITH CORONARY ANGIOGRAM N/A 05/12/2011   Procedure: LEFT HEART CATHETERIZATION WITH CORONARY ANGIOGRAM;  Surgeon: Troy Sine, MD;  Location: [facility] CATH LAB;  Service: Cardiovascular;  Laterality: N/A;  . LEFT HEART CATHETERIZATION WITH CORONARY ANGIOGRAM N/A 06/22/2011   Procedure: LEFT HEART CATHETERIZATION WITH CORONARY ANGIOGRAM;  Surgeon: Wellington Hampshire, MD;  Location: [facility];  Service: Cardiovascular;  Laterality: N/A;  . LEFT HEART CATHETERIZATION WITH CORONARY ANGIOGRAM N/A 08/20/2012   Procedure: LEFT HEART CATHETERIZATION WITH CORONARY ANGIOGRAM;  Surgeon: Burnell Blanks, MD;  Location: [facility] C[facility] LAB;  Service: Cardiovascular;  Laterality: N/A;  . PARTIAL OOPHORECTOMY Right 1955   BENIGN  . PERCUTANEOUS CORONARY STENT INTERVENTION (PCI-S)  05/12/2011   Procedure: PERCUTANEOUS CORONARY STENT INTERVENTION (PCI-S);  Surgeon: Troy Sine, MD;  Location: [facility] CATH LAB;  Service: Cardiovascular;;  . POLYPECTOMY N/A 05/20/2015   Procedure: EGD with polypectomy;  Surgeon: Rogene Houston, MD;  [facility];  Service: Endoscopy;  Laterality: N/A;  . TRANSTHORACIC ECHOCARDIOGRAM  10-09-2017   dr Bronson Ing   ef 55-60%, moderate conentric LVH, grade 2 diastolic dysfunction/  basal septal hypertrophy with systolic anterior motion of the mitral valve outflow track peak grandient greater than 148mmHg with  valsalva/  mild AR, MR, and TR  . TRANSURETHRAL RESECTION OF BLADDER TUMOR N/A 04/30/2018   Procedure: TRANSURETHRAL RESECTION OF BLADDER TUMOR (TURBT);  Surgeon: Kathie Rhodes, MD;  Location: [facility];  Service: Urology;  Laterality: N/A;  . WISDOM TOOTH EXTRACTION      Social History   Socioeconomic History  . Marital status: Married    Spouse name: Not on file  . Number of children: Not on file  . Years of education: Not on file  . Highest education level: Not on file  Occupational History  . Not on file  Social Needs  . Financial resource strain: Not on file  . Food insecurity:    Worry: Not on file    Inability: Not on file  . Transportation needs:    Medical: Not on file    Non-medical: Not on file  Tobacco Use  . Smoking status: Former Smoker    Packs/day: 1.00    Years: 12.00    Pack years: 12.00    Types: Cigarettes    Last attempt to quit: 05/11/1979    Years since quitting: 39.2  . Smokeless tobacco: Never Used  Substance and Sexual Activity  . Alcohol use: No  . Drug use: No  . Sexual activity: Yes    Birth control/protection: Post-menopausal  Lifestyle  . Physical activity:    Days per week: Not on file    Minutes per session: Not on file  . Stress: Not on file  Relationships  . Social connections:    Talks on phone: Not on file    Gets together: Not on file    Attends religious service: Not on file    Active member of club or organization: Not on file    Attends meetings of clubs or organizations: Not on file    Relationship status: Not on file  . Intimate partner violence:    Fear of current or ex partner: Not on file    Emotionally abused: Not on file    Physically abused: Not on file    Forced sexual activity: Not on file  Other Topics Concern  . Not on file  Social History Narrative  . Not on file     Vitals:  08/07/18 1302  BP: 126/60  Pulse: 68  SpO2: 95%  Weight: 117 lb (53.1 kg)  Height: 5\' 4"  (1.626 m)    Wt Readings from  Last 3 Encounters:  08/07/18 117 lb (53.1 kg)  05/16/18 122 lb 12.8 oz (55.7 kg)  05/03/18 122 lb (55.3 kg)     PHYSICAL EXAM General: NAD HEENT: Normal. Neck: No JVD, no thyromegaly. Lungs: No crackles or wheezes.  Good respiratory effort. CV: Regular rate and rhythm, normal S1/S2, no S3/S4, no murmur. No pretibial or periankle edema.   Abdomen: Soft, nontender, no distention.  Neurologic: Alert and oriented.  Psych: Normal affect. Skin: Normal. Musculoskeletal: No gross deformities.    ECG: Reviewed above under Subjective   Labs: Lab Results  Component Value Date/Time   K 4.6 04/30/2018 01:25 PM   BUN 19 04/30/2018 01:25 PM   CREATININE 0.57 04/30/2018 01:25 PM   ALT 14 04/30/2018 01:25 PM   HGB 14.6 04/30/2018 01:25 PM     Lipids: No results found for: LDLCALC, LDLDIRECT, CHOL, TRIG, HDL     ASSESSMENT AND PLAN: 1.  Coronary artery disease: Status post drug-eluting stent placement to the LAD in 2012 with patent stent by repeat cardiac catheterization July 2016.  She underwent a low risk nuclear stress test in October 2019.  Symptomatically stable.  Continue aspirin, statin, amlodipine, and Imdur.  2.  Bradycardia: Resolved with discontinuation of metoprolol.  Symptoms of dyspnea have resolved.  Continue to avoid AV nodal blocking agents altogether.  3.  Hypertension: Blood pressure is normal.  No changes to therapy.  4.  Hyperlipidemia: She remains on atorvastatin 10 mg daily.  5.  Carotid artery stenosis: Minimal stenosis by Doppler studies in November 2016.  No plans to repeat imaging given minimal stenosis and advanced age.    Disposition: Follow up 6 months   [name], M.D., F.A.C.C.

## 2018-08-07 NOTE — Patient Instructions (Signed)
Medication Instructions:  Your physician recommends that you continue on your current medications as directed. Please refer to the Current Medication list given to you today.  If you need a refill on your cardiac medications before your next appointment, please call your pharmacy.   Lab work: None today If you have labs (blood work) drawn today and your tests are completely normal, you will receive your results only by: Marland Kitchen MyChart Message (if you have MyChart) OR . A paper copy in the mail If you have any lab test that is abnormal or we need to change your treatment, we will call you to review the results.  Testing/Procedures: None today  Follow-Up: At [facility], you and your health needs are our priority.  As part of our continuing mission to provide you with exceptional heart care, we have created designated Provider Care Teams.  These Care Teams include your primary Cardiologist (physician) and Advanced Practice Providers (APPs -  Physician Assistants and Nurse Practitioners) who all work together to provide you with the care you need, when you need it. You will need a follow up appointment in 6 months.  Please call our office 2 months in advance to schedule this appointment.  You may see [name], MD or one of the following Advanced Practice Providers on your designated Care Team:   [name], PA-C [facility]) . [name], PA-C ([place])  Any Other Special Instructions Will Be Listed Below (If Applicable). none

## 2018-08-08 DIAGNOSIS — I422 Other hypertrophic cardiomyopathy: Secondary | ICD-10-CM | POA: Diagnosis not present

## 2018-08-08 DIAGNOSIS — I252 Old myocardial infarction: Secondary | ICD-10-CM | POA: Diagnosis not present

## 2018-08-08 DIAGNOSIS — Z955 Presence of coronary angioplasty implant and graft: Secondary | ICD-10-CM | POA: Diagnosis not present

## 2018-08-08 DIAGNOSIS — D649 Anemia, unspecified: Secondary | ICD-10-CM | POA: Diagnosis not present

## 2018-08-08 DIAGNOSIS — Z87891 Personal history of nicotine dependence: Secondary | ICD-10-CM | POA: Diagnosis not present

## 2018-08-08 DIAGNOSIS — C672 Malignant neoplasm of lateral wall of bladder: Secondary | ICD-10-CM | POA: Diagnosis not present

## 2018-08-09 DIAGNOSIS — I422 Other hypertrophic cardiomyopathy: Secondary | ICD-10-CM | POA: Diagnosis not present

## 2018-08-09 DIAGNOSIS — C672 Malignant neoplasm of lateral wall of bladder: Secondary | ICD-10-CM | POA: Diagnosis not present

## 2018-08-09 DIAGNOSIS — Z87891 Personal history of nicotine dependence: Secondary | ICD-10-CM | POA: Diagnosis not present

## 2018-08-09 DIAGNOSIS — D649 Anemia, unspecified: Secondary | ICD-10-CM | POA: Diagnosis not present

## 2018-08-09 DIAGNOSIS — I252 Old myocardial infarction: Secondary | ICD-10-CM | POA: Diagnosis not present

## 2018-08-09 DIAGNOSIS — Z955 Presence of coronary angioplasty implant and graft: Secondary | ICD-10-CM | POA: Diagnosis not present

## 2018-08-10 DIAGNOSIS — D649 Anemia, unspecified: Secondary | ICD-10-CM | POA: Diagnosis not present

## 2018-08-10 DIAGNOSIS — Z955 Presence of coronary angioplasty implant and graft: Secondary | ICD-10-CM | POA: Diagnosis not present

## 2018-08-10 DIAGNOSIS — I422 Other hypertrophic cardiomyopathy: Secondary | ICD-10-CM | POA: Diagnosis not present

## 2018-08-10 DIAGNOSIS — Z87891 Personal history of nicotine dependence: Secondary | ICD-10-CM | POA: Diagnosis not present

## 2018-08-10 DIAGNOSIS — I252 Old myocardial infarction: Secondary | ICD-10-CM | POA: Diagnosis not present

## 2018-08-10 DIAGNOSIS — C672 Malignant neoplasm of lateral wall of bladder: Secondary | ICD-10-CM | POA: Diagnosis not present

## 2018-08-13 DIAGNOSIS — I422 Other hypertrophic cardiomyopathy: Secondary | ICD-10-CM | POA: Diagnosis not present

## 2018-08-13 DIAGNOSIS — Z87891 Personal history of nicotine dependence: Secondary | ICD-10-CM | POA: Diagnosis not present

## 2018-08-13 DIAGNOSIS — Z955 Presence of coronary angioplasty implant and graft: Secondary | ICD-10-CM | POA: Diagnosis not present

## 2018-08-13 DIAGNOSIS — D649 Anemia, unspecified: Secondary | ICD-10-CM | POA: Diagnosis not present

## 2018-08-13 DIAGNOSIS — C672 Malignant neoplasm of lateral wall of bladder: Secondary | ICD-10-CM | POA: Diagnosis not present

## 2018-08-13 DIAGNOSIS — I252 Old myocardial infarction: Secondary | ICD-10-CM | POA: Diagnosis not present

## 2018-08-14 DIAGNOSIS — D649 Anemia, unspecified: Secondary | ICD-10-CM | POA: Diagnosis not present

## 2018-08-14 DIAGNOSIS — I422 Other hypertrophic cardiomyopathy: Secondary | ICD-10-CM | POA: Diagnosis not present

## 2018-08-14 DIAGNOSIS — Z955 Presence of coronary angioplasty implant and graft: Secondary | ICD-10-CM | POA: Diagnosis not present

## 2018-08-14 DIAGNOSIS — C672 Malignant neoplasm of lateral wall of bladder: Secondary | ICD-10-CM | POA: Diagnosis not present

## 2018-08-14 DIAGNOSIS — I252 Old myocardial infarction: Secondary | ICD-10-CM | POA: Diagnosis not present

## 2018-08-14 DIAGNOSIS — Z87891 Personal history of nicotine dependence: Secondary | ICD-10-CM | POA: Diagnosis not present

## 2018-08-15 DIAGNOSIS — C672 Malignant neoplasm of lateral wall of bladder: Secondary | ICD-10-CM | POA: Diagnosis not present

## 2018-08-15 DIAGNOSIS — Z87891 Personal history of nicotine dependence: Secondary | ICD-10-CM | POA: Diagnosis not present

## 2018-08-15 DIAGNOSIS — D649 Anemia, unspecified: Secondary | ICD-10-CM | POA: Diagnosis not present

## 2018-08-15 DIAGNOSIS — I422 Other hypertrophic cardiomyopathy: Secondary | ICD-10-CM | POA: Diagnosis not present

## 2018-08-15 DIAGNOSIS — I252 Old myocardial infarction: Secondary | ICD-10-CM | POA: Diagnosis not present

## 2018-08-15 DIAGNOSIS — Z955 Presence of coronary angioplasty implant and graft: Secondary | ICD-10-CM | POA: Diagnosis not present

## 2018-08-16 DIAGNOSIS — I252 Old myocardial infarction: Secondary | ICD-10-CM | POA: Diagnosis not present

## 2018-08-16 DIAGNOSIS — Z955 Presence of coronary angioplasty implant and graft: Secondary | ICD-10-CM | POA: Diagnosis not present

## 2018-08-16 DIAGNOSIS — D649 Anemia, unspecified: Secondary | ICD-10-CM | POA: Diagnosis not present

## 2018-08-16 DIAGNOSIS — Z87891 Personal history of nicotine dependence: Secondary | ICD-10-CM | POA: Diagnosis not present

## 2018-08-16 DIAGNOSIS — I422 Other hypertrophic cardiomyopathy: Secondary | ICD-10-CM | POA: Diagnosis not present

## 2018-08-16 DIAGNOSIS — C672 Malignant neoplasm of lateral wall of bladder: Secondary | ICD-10-CM | POA: Diagnosis not present

## 2018-09-12 ENCOUNTER — Other Ambulatory Visit: Payer: Self-pay | Admitting: Radiation Oncology

## 2018-09-12 ENCOUNTER — Other Ambulatory Visit (HOSPITAL_COMMUNITY): Payer: Self-pay | Admitting: Radiation Oncology

## 2018-09-12 DIAGNOSIS — Z888 Allergy status to other drugs, medicaments and biological substances status: Secondary | ICD-10-CM | POA: Diagnosis not present

## 2018-09-12 DIAGNOSIS — C678 Malignant neoplasm of overlapping sites of bladder: Secondary | ICD-10-CM | POA: Diagnosis not present

## 2018-09-12 DIAGNOSIS — I252 Old myocardial infarction: Secondary | ICD-10-CM | POA: Diagnosis not present

## 2018-09-12 DIAGNOSIS — Z881 Allergy status to other antibiotic agents status: Secondary | ICD-10-CM | POA: Diagnosis not present

## 2018-09-12 DIAGNOSIS — C672 Malignant neoplasm of lateral wall of bladder: Secondary | ICD-10-CM | POA: Diagnosis not present

## 2018-09-12 DIAGNOSIS — I422 Other hypertrophic cardiomyopathy: Secondary | ICD-10-CM | POA: Diagnosis not present

## 2018-09-12 DIAGNOSIS — D649 Anemia, unspecified: Secondary | ICD-10-CM | POA: Diagnosis not present

## 2018-09-12 DIAGNOSIS — Z87891 Personal history of nicotine dependence: Secondary | ICD-10-CM | POA: Diagnosis not present

## 2018-09-12 DIAGNOSIS — R32 Unspecified urinary incontinence: Secondary | ICD-10-CM | POA: Diagnosis not present

## 2018-09-12 DIAGNOSIS — Z955 Presence of coronary angioplasty implant and graft: Secondary | ICD-10-CM | POA: Diagnosis not present

## 2018-09-12 DIAGNOSIS — Z91013 Allergy to seafood: Secondary | ICD-10-CM | POA: Diagnosis not present

## 2018-09-12 DIAGNOSIS — K5909 Other constipation: Secondary | ICD-10-CM | POA: Diagnosis not present

## 2018-09-19 ENCOUNTER — Other Ambulatory Visit: Payer: Self-pay

## 2018-09-19 ENCOUNTER — Ambulatory Visit (HOSPITAL_COMMUNITY)
Admission: RE | Admit: 2018-09-19 | Discharge: 2018-09-19 | Disposition: A | Discharge status: Home / Self Care | Payer: Medicare Other | Source: Ambulatory Visit | Attending: Radiation Oncology | Admitting: Radiation Oncology

## 2018-09-19 DIAGNOSIS — C678 Malignant neoplasm of overlapping sites of bladder: Secondary | ICD-10-CM | POA: Insufficient documentation

## 2018-09-19 DIAGNOSIS — C679 Malignant neoplasm of bladder, unspecified: Secondary | ICD-10-CM | POA: Diagnosis not present

## 2018-09-19 LAB — GLUCOSE, CAPILLARY: Glucose-Capillary: 87 mg/dL (ref 70–99)

## 2018-09-22 DIAGNOSIS — R42 Dizziness and giddiness: Secondary | ICD-10-CM | POA: Diagnosis not present

## 2018-09-22 DIAGNOSIS — I10 Essential (primary) hypertension: Secondary | ICD-10-CM | POA: Diagnosis not present

## 2018-09-22 DIAGNOSIS — S0990XA Unspecified injury of head, initial encounter: Secondary | ICD-10-CM | POA: Diagnosis not present

## 2018-09-22 DIAGNOSIS — Z79899 Other long term (current) drug therapy: Secondary | ICD-10-CM | POA: Diagnosis not present

## 2018-09-22 DIAGNOSIS — S0081XA Abrasion of other part of head, initial encounter: Secondary | ICD-10-CM | POA: Diagnosis not present

## 2018-09-22 DIAGNOSIS — M47892 Other spondylosis, cervical region: Secondary | ICD-10-CM | POA: Diagnosis not present

## 2018-09-22 DIAGNOSIS — Z7982 Long term (current) use of aspirin: Secondary | ICD-10-CM | POA: Diagnosis not present

## 2018-09-22 DIAGNOSIS — S0181XA Laceration without foreign body of other part of head, initial encounter: Secondary | ICD-10-CM | POA: Diagnosis not present

## 2018-09-22 DIAGNOSIS — W19XXXA Unspecified fall, initial encounter: Secondary | ICD-10-CM | POA: Diagnosis not present

## 2018-09-22 DIAGNOSIS — I252 Old myocardial infarction: Secondary | ICD-10-CM | POA: Diagnosis not present

## 2018-10-04 DIAGNOSIS — D509 Iron deficiency anemia, unspecified: Secondary | ICD-10-CM | POA: Diagnosis not present

## 2018-10-04 DIAGNOSIS — M810 Age-related osteoporosis without current pathological fracture: Secondary | ICD-10-CM | POA: Diagnosis not present

## 2018-10-04 DIAGNOSIS — E782 Mixed hyperlipidemia: Secondary | ICD-10-CM | POA: Diagnosis not present

## 2018-10-04 DIAGNOSIS — E1140 Type 2 diabetes mellitus with diabetic neuropathy, unspecified: Secondary | ICD-10-CM | POA: Diagnosis not present

## 2018-10-04 DIAGNOSIS — R42 Dizziness and giddiness: Secondary | ICD-10-CM | POA: Diagnosis not present

## 2018-10-04 DIAGNOSIS — I10 Essential (primary) hypertension: Secondary | ICD-10-CM | POA: Diagnosis not present

## 2018-10-17 DIAGNOSIS — C679 Malignant neoplasm of bladder, unspecified: Secondary | ICD-10-CM | POA: Diagnosis not present

## 2018-11-08 DIAGNOSIS — C678 Malignant neoplasm of overlapping sites of bladder: Secondary | ICD-10-CM | POA: Diagnosis not present

## 2018-12-19 DIAGNOSIS — L821 Other seborrheic keratosis: Secondary | ICD-10-CM | POA: Diagnosis not present

## 2018-12-19 DIAGNOSIS — D229 Melanocytic nevi, unspecified: Secondary | ICD-10-CM | POA: Diagnosis not present

## 2018-12-19 DIAGNOSIS — D1801 Hemangioma of skin and subcutaneous tissue: Secondary | ICD-10-CM | POA: Diagnosis not present

## 2018-12-19 DIAGNOSIS — L814 Other melanin hyperpigmentation: Secondary | ICD-10-CM | POA: Diagnosis not present

## 2019-01-16 DIAGNOSIS — Z7982 Long term (current) use of aspirin: Secondary | ICD-10-CM | POA: Diagnosis not present

## 2019-01-16 DIAGNOSIS — C672 Malignant neoplasm of lateral wall of bladder: Secondary | ICD-10-CM | POA: Diagnosis not present

## 2019-01-16 DIAGNOSIS — Z880 Allergy status to penicillin: Secondary | ICD-10-CM | POA: Diagnosis not present

## 2019-01-16 DIAGNOSIS — K5909 Other constipation: Secondary | ICD-10-CM | POA: Diagnosis not present

## 2019-01-16 DIAGNOSIS — Z91013 Allergy to seafood: Secondary | ICD-10-CM | POA: Diagnosis not present

## 2019-01-16 DIAGNOSIS — R0609 Other forms of dyspnea: Secondary | ICD-10-CM | POA: Diagnosis not present

## 2019-01-16 DIAGNOSIS — R61 Generalized hyperhidrosis: Secondary | ICD-10-CM | POA: Diagnosis not present

## 2019-01-16 DIAGNOSIS — Z86010 Personal history of colonic polyps: Secondary | ICD-10-CM | POA: Diagnosis not present

## 2019-01-16 DIAGNOSIS — E785 Hyperlipidemia, unspecified: Secondary | ICD-10-CM | POA: Diagnosis not present

## 2019-01-16 DIAGNOSIS — J449 Chronic obstructive pulmonary disease, unspecified: Secondary | ICD-10-CM | POA: Diagnosis not present

## 2019-01-16 DIAGNOSIS — Z79899 Other long term (current) drug therapy: Secondary | ICD-10-CM | POA: Diagnosis not present

## 2019-01-16 DIAGNOSIS — K219 Gastro-esophageal reflux disease without esophagitis: Secondary | ICD-10-CM | POA: Diagnosis not present

## 2019-01-16 DIAGNOSIS — Z87891 Personal history of nicotine dependence: Secondary | ICD-10-CM | POA: Diagnosis not present

## 2019-01-16 DIAGNOSIS — B955 Unspecified streptococcus as the cause of diseases classified elsewhere: Secondary | ICD-10-CM | POA: Diagnosis not present

## 2019-01-16 DIAGNOSIS — I252 Old myocardial infarction: Secondary | ICD-10-CM | POA: Diagnosis not present

## 2019-01-16 DIAGNOSIS — I517 Cardiomegaly: Secondary | ICD-10-CM | POA: Diagnosis not present

## 2019-01-16 DIAGNOSIS — R32 Unspecified urinary incontinence: Secondary | ICD-10-CM | POA: Diagnosis not present

## 2019-01-16 DIAGNOSIS — R0602 Shortness of breath: Secondary | ICD-10-CM | POA: Diagnosis not present

## 2019-01-16 DIAGNOSIS — D500 Iron deficiency anemia secondary to blood loss (chronic): Secondary | ICD-10-CM | POA: Diagnosis not present

## 2019-01-16 DIAGNOSIS — E870 Hyperosmolality and hypernatremia: Secondary | ICD-10-CM | POA: Diagnosis not present

## 2019-01-16 DIAGNOSIS — R918 Other nonspecific abnormal finding of lung field: Secondary | ICD-10-CM | POA: Diagnosis not present

## 2019-01-16 DIAGNOSIS — Z8551 Personal history of malignant neoplasm of bladder: Secondary | ICD-10-CM | POA: Diagnosis not present

## 2019-01-16 DIAGNOSIS — C678 Malignant neoplasm of overlapping sites of bladder: Secondary | ICD-10-CM | POA: Diagnosis not present

## 2019-01-16 DIAGNOSIS — J441 Chronic obstructive pulmonary disease with (acute) exacerbation: Secondary | ICD-10-CM | POA: Diagnosis not present

## 2019-01-16 DIAGNOSIS — I10 Essential (primary) hypertension: Secondary | ICD-10-CM | POA: Diagnosis not present

## 2019-01-16 DIAGNOSIS — Z955 Presence of coronary angioplasty implant and graft: Secondary | ICD-10-CM | POA: Diagnosis not present

## 2019-01-16 DIAGNOSIS — R310 Gross hematuria: Secondary | ICD-10-CM | POA: Diagnosis not present

## 2019-01-16 DIAGNOSIS — K921 Melena: Secondary | ICD-10-CM | POA: Diagnosis not present

## 2019-01-16 DIAGNOSIS — N390 Urinary tract infection, site not specified: Secondary | ICD-10-CM | POA: Diagnosis not present

## 2019-01-16 DIAGNOSIS — Z881 Allergy status to other antibiotic agents status: Secondary | ICD-10-CM | POA: Diagnosis not present

## 2019-01-16 DIAGNOSIS — D649 Anemia, unspecified: Secondary | ICD-10-CM | POA: Diagnosis not present

## 2019-01-16 DIAGNOSIS — R0902 Hypoxemia: Secondary | ICD-10-CM | POA: Diagnosis not present

## 2019-01-16 DIAGNOSIS — R262 Difficulty in walking, not elsewhere classified: Secondary | ICD-10-CM | POA: Diagnosis not present

## 2019-01-16 DIAGNOSIS — I422 Other hypertrophic cardiomyopathy: Secondary | ICD-10-CM | POA: Diagnosis not present

## 2019-01-16 DIAGNOSIS — Z90710 Acquired absence of both cervix and uterus: Secondary | ICD-10-CM | POA: Diagnosis not present

## 2019-01-16 DIAGNOSIS — Z888 Allergy status to other drugs, medicaments and biological substances status: Secondary | ICD-10-CM | POA: Diagnosis not present

## 2019-01-16 DIAGNOSIS — I2510 Atherosclerotic heart disease of native coronary artery without angina pectoris: Secondary | ICD-10-CM | POA: Diagnosis not present

## 2019-01-16 DIAGNOSIS — K625 Hemorrhage of anus and rectum: Secondary | ICD-10-CM | POA: Diagnosis not present

## 2019-01-17 DIAGNOSIS — E785 Hyperlipidemia, unspecified: Secondary | ICD-10-CM | POA: Diagnosis not present

## 2019-01-17 DIAGNOSIS — R0902 Hypoxemia: Secondary | ICD-10-CM | POA: Diagnosis not present

## 2019-01-17 DIAGNOSIS — E871 Hypo-osmolality and hyponatremia: Secondary | ICD-10-CM | POA: Diagnosis not present

## 2019-01-17 DIAGNOSIS — I10 Essential (primary) hypertension: Secondary | ICD-10-CM | POA: Diagnosis not present

## 2019-01-18 DIAGNOSIS — J441 Chronic obstructive pulmonary disease with (acute) exacerbation: Secondary | ICD-10-CM | POA: Diagnosis not present

## 2019-01-18 DIAGNOSIS — E785 Hyperlipidemia, unspecified: Secondary | ICD-10-CM | POA: Diagnosis not present

## 2019-01-24 DIAGNOSIS — J441 Chronic obstructive pulmonary disease with (acute) exacerbation: Secondary | ICD-10-CM | POA: Diagnosis not present

## 2019-01-24 DIAGNOSIS — R0902 Hypoxemia: Secondary | ICD-10-CM | POA: Diagnosis not present

## 2019-01-24 DIAGNOSIS — D7589 Other specified diseases of blood and blood-forming organs: Secondary | ICD-10-CM | POA: Diagnosis not present

## 2019-01-24 DIAGNOSIS — C672 Malignant neoplasm of lateral wall of bladder: Secondary | ICD-10-CM | POA: Diagnosis not present

## 2019-01-24 DIAGNOSIS — D500 Iron deficiency anemia secondary to blood loss (chronic): Secondary | ICD-10-CM | POA: Diagnosis not present

## 2019-01-24 DIAGNOSIS — D494 Neoplasm of unspecified behavior of bladder: Secondary | ICD-10-CM | POA: Diagnosis not present

## 2019-01-25 DIAGNOSIS — J441 Chronic obstructive pulmonary disease with (acute) exacerbation: Secondary | ICD-10-CM | POA: Diagnosis not present

## 2019-01-25 DIAGNOSIS — D509 Iron deficiency anemia, unspecified: Secondary | ICD-10-CM | POA: Diagnosis not present

## 2019-01-25 DIAGNOSIS — I5032 Chronic diastolic (congestive) heart failure: Secondary | ICD-10-CM | POA: Diagnosis not present

## 2019-01-25 DIAGNOSIS — J479 Bronchiectasis, uncomplicated: Secondary | ICD-10-CM | POA: Diagnosis not present

## 2019-01-25 DIAGNOSIS — R0600 Dyspnea, unspecified: Secondary | ICD-10-CM | POA: Diagnosis not present

## 2019-01-30 DIAGNOSIS — R32 Unspecified urinary incontinence: Secondary | ICD-10-CM | POA: Diagnosis not present

## 2019-01-30 DIAGNOSIS — K5909 Other constipation: Secondary | ICD-10-CM | POA: Diagnosis not present

## 2019-01-30 DIAGNOSIS — I422 Other hypertrophic cardiomyopathy: Secondary | ICD-10-CM | POA: Diagnosis not present

## 2019-01-30 DIAGNOSIS — Z955 Presence of coronary angioplasty implant and graft: Secondary | ICD-10-CM | POA: Diagnosis not present

## 2019-01-30 DIAGNOSIS — D494 Neoplasm of unspecified behavior of bladder: Secondary | ICD-10-CM | POA: Diagnosis not present

## 2019-01-30 DIAGNOSIS — R310 Gross hematuria: Secondary | ICD-10-CM | POA: Diagnosis not present

## 2019-01-30 DIAGNOSIS — I252 Old myocardial infarction: Secondary | ICD-10-CM | POA: Diagnosis not present

## 2019-01-30 DIAGNOSIS — Z87891 Personal history of nicotine dependence: Secondary | ICD-10-CM | POA: Diagnosis not present

## 2019-01-30 DIAGNOSIS — R0609 Other forms of dyspnea: Secondary | ICD-10-CM | POA: Diagnosis not present

## 2019-01-30 DIAGNOSIS — Z9981 Dependence on supplemental oxygen: Secondary | ICD-10-CM | POA: Diagnosis not present

## 2019-01-30 DIAGNOSIS — D649 Anemia, unspecified: Secondary | ICD-10-CM | POA: Diagnosis not present

## 2019-01-30 DIAGNOSIS — K921 Melena: Secondary | ICD-10-CM | POA: Diagnosis not present

## 2019-01-30 DIAGNOSIS — Z881 Allergy status to other antibiotic agents status: Secondary | ICD-10-CM | POA: Diagnosis not present

## 2019-01-30 DIAGNOSIS — C678 Malignant neoplasm of overlapping sites of bladder: Secondary | ICD-10-CM | POA: Diagnosis not present

## 2019-01-30 DIAGNOSIS — D500 Iron deficiency anemia secondary to blood loss (chronic): Secondary | ICD-10-CM | POA: Diagnosis not present

## 2019-01-30 DIAGNOSIS — Z91013 Allergy to seafood: Secondary | ICD-10-CM | POA: Diagnosis not present

## 2019-01-30 DIAGNOSIS — C672 Malignant neoplasm of lateral wall of bladder: Secondary | ICD-10-CM | POA: Diagnosis not present

## 2019-01-30 DIAGNOSIS — Z888 Allergy status to other drugs, medicaments and biological substances status: Secondary | ICD-10-CM | POA: Diagnosis not present

## 2019-02-01 DIAGNOSIS — I10 Essential (primary) hypertension: Secondary | ICD-10-CM | POA: Diagnosis not present

## 2019-02-01 DIAGNOSIS — E785 Hyperlipidemia, unspecified: Secondary | ICD-10-CM | POA: Diagnosis not present

## 2019-02-12 DIAGNOSIS — E782 Mixed hyperlipidemia: Secondary | ICD-10-CM | POA: Diagnosis not present

## 2019-02-12 DIAGNOSIS — I5032 Chronic diastolic (congestive) heart failure: Secondary | ICD-10-CM | POA: Diagnosis not present

## 2019-02-12 DIAGNOSIS — J441 Chronic obstructive pulmonary disease with (acute) exacerbation: Secondary | ICD-10-CM | POA: Diagnosis not present

## 2019-02-12 DIAGNOSIS — Z961 Presence of intraocular lens: Secondary | ICD-10-CM | POA: Diagnosis not present

## 2019-02-12 DIAGNOSIS — H52223 Regular astigmatism, bilateral: Secondary | ICD-10-CM | POA: Diagnosis not present

## 2019-02-12 DIAGNOSIS — J479 Bronchiectasis, uncomplicated: Secondary | ICD-10-CM | POA: Diagnosis not present

## 2019-02-12 DIAGNOSIS — C679 Malignant neoplasm of bladder, unspecified: Secondary | ICD-10-CM | POA: Diagnosis not present

## 2019-02-12 DIAGNOSIS — E559 Vitamin D deficiency, unspecified: Secondary | ICD-10-CM | POA: Diagnosis not present

## 2019-02-12 DIAGNOSIS — D509 Iron deficiency anemia, unspecified: Secondary | ICD-10-CM | POA: Diagnosis not present

## 2019-02-12 DIAGNOSIS — I10 Essential (primary) hypertension: Secondary | ICD-10-CM | POA: Diagnosis not present

## 2019-02-12 DIAGNOSIS — H5203 Hypermetropia, bilateral: Secondary | ICD-10-CM | POA: Diagnosis not present

## 2019-02-12 DIAGNOSIS — R0600 Dyspnea, unspecified: Secondary | ICD-10-CM | POA: Diagnosis not present

## 2019-02-12 DIAGNOSIS — E1140 Type 2 diabetes mellitus with diabetic neuropathy, unspecified: Secondary | ICD-10-CM | POA: Diagnosis not present

## 2019-02-12 DIAGNOSIS — H02102 Unspecified ectropion of right lower eyelid: Secondary | ICD-10-CM | POA: Diagnosis not present

## 2019-02-12 DIAGNOSIS — Z9849 Cataract extraction status, unspecified eye: Secondary | ICD-10-CM | POA: Diagnosis not present

## 2019-02-12 DIAGNOSIS — Z23 Encounter for immunization: Secondary | ICD-10-CM | POA: Diagnosis not present

## 2019-02-12 DIAGNOSIS — Z6820 Body mass index (BMI) 20.0-20.9, adult: Secondary | ICD-10-CM | POA: Diagnosis not present

## 2019-02-12 DIAGNOSIS — H524 Presbyopia: Secondary | ICD-10-CM | POA: Diagnosis not present

## 2019-02-12 DIAGNOSIS — I700 Atherosclerosis of aorta: Secondary | ICD-10-CM | POA: Diagnosis not present

## 2019-02-12 DIAGNOSIS — Z0001 Encounter for general adult medical examination with abnormal findings: Secondary | ICD-10-CM | POA: Diagnosis not present

## 2019-02-17 DIAGNOSIS — I10 Essential (primary) hypertension: Secondary | ICD-10-CM | POA: Diagnosis not present

## 2019-02-17 DIAGNOSIS — R42 Dizziness and giddiness: Secondary | ICD-10-CM | POA: Diagnosis not present

## 2019-02-17 DIAGNOSIS — J441 Chronic obstructive pulmonary disease with (acute) exacerbation: Secondary | ICD-10-CM | POA: Diagnosis not present

## 2019-02-17 DIAGNOSIS — E782 Mixed hyperlipidemia: Secondary | ICD-10-CM | POA: Diagnosis not present

## 2019-02-17 DIAGNOSIS — E1140 Type 2 diabetes mellitus with diabetic neuropathy, unspecified: Secondary | ICD-10-CM | POA: Diagnosis not present

## 2019-02-17 DIAGNOSIS — C679 Malignant neoplasm of bladder, unspecified: Secondary | ICD-10-CM | POA: Diagnosis not present

## 2019-02-17 DIAGNOSIS — R0600 Dyspnea, unspecified: Secondary | ICD-10-CM | POA: Diagnosis not present

## 2019-02-17 DIAGNOSIS — D509 Iron deficiency anemia, unspecified: Secondary | ICD-10-CM | POA: Diagnosis not present

## 2019-02-20 ENCOUNTER — Telehealth: Payer: Self-pay | Admitting: Cardiovascular Disease

## 2019-02-20 NOTE — Telephone Encounter (Signed)

## 2019-02-21 ENCOUNTER — Ambulatory Visit (INDEPENDENT_AMBULATORY_CARE_PROVIDER_SITE_OTHER): Payer: Medicare Other | Admitting: Cardiovascular Disease

## 2019-02-21 ENCOUNTER — Other Ambulatory Visit: Payer: Self-pay

## 2019-02-21 ENCOUNTER — Encounter: Payer: Self-pay | Admitting: Cardiovascular Disease

## 2019-02-21 VITALS — BP 167/72 | HR 61 | Ht 64.0 in | Wt 115.0 lb

## 2019-02-21 DIAGNOSIS — I25118 Atherosclerotic heart disease of native coronary artery with other forms of angina pectoris: Secondary | ICD-10-CM

## 2019-02-21 DIAGNOSIS — I10 Essential (primary) hypertension: Secondary | ICD-10-CM | POA: Diagnosis not present

## 2019-02-21 DIAGNOSIS — Z955 Presence of coronary angioplasty implant and graft: Secondary | ICD-10-CM | POA: Diagnosis not present

## 2019-02-21 DIAGNOSIS — E785 Hyperlipidemia, unspecified: Secondary | ICD-10-CM | POA: Diagnosis not present

## 2019-02-21 DIAGNOSIS — H9193 Unspecified hearing loss, bilateral: Secondary | ICD-10-CM

## 2019-02-21 DIAGNOSIS — I6523 Occlusion and stenosis of bilateral carotid arteries: Secondary | ICD-10-CM

## 2019-02-21 MED ORDER — AMLODIPINE BESYLATE 5 MG PO TABS: 5.0000 mg | ORAL_TABLET | Freq: Every day | ORAL | 1 refills | Status: DC

## 2019-02-21 NOTE — Patient Instructions (Addendum)
Your physician wants you to follow-up in: 6 MONTHS WITH extender You will receive a reminder letter in the mail two months in advance. If you don't receive a letter, please call our office to schedule the follow-up appointment.  Your physician has recommended you make the following change in your medication:   START AMLODIPINE 5MG  DAILY .  Thank you for choosing [facility]!!

## 2019-02-21 NOTE — Progress Notes (Signed)
SUBJECTIVE: The patient presents for routine follow-up of coronary artery disease.  She had a drug-eluting stent placed to the LAD in 2012 with a patent stent by repeat cardiac catheterization in July 2016.  She underwent a low risk nuclear stress test in October 2019.  Her husband tells me that she has essentially lost her hearing in the past 3 weeks.  She is scheduled to see an ENT specialist on September 3.  She denies chest pain, palpitations, leg swelling, and shortness of breath.  She is on oxygen now as prescribed by her PCP.  She was put on prednisone for a brief period of time but her husband says "it made her crazy ".  She had a fall and hit her back the other night and is wearing an analgesic patch.    Review of Systems: As per "subjective", otherwise negative.  Allergies  Allergen Reactions   Brilinta [Ticagrelor] Shortness Of Breath, Nausea And Vomiting and Other (See Comments)    dyspnea   Iodine Nausea And Vomiting and Other (See Comments)    AND DIZZY   Metoprolol Other (See Comments)    Bradycardia   Shellfish Allergy Nausea And Vomiting and Other (See Comments)    Unsteady gait/  PER PT ONLY SHRIMP   Streptomycin Nausea And Vomiting   Iodinated Diagnostic Agents Nausea Only    Current Outpatient Medications  Medication Sig Dispense Refill   aspirin EC 81 MG tablet Take 81 mg by mouth daily.     atorvastatin (LIPITOR) 10 MG tablet Take 10 mg by mouth every morning.   3   carbamide peroxide (E-R-O EAR WAX REMOVAL SYSTEM) 6.5 % OTIC solution Place 5 drops into both ears daily as needed (ear wax).     Meclizine HCl (TRAVEL SICKNESS) 25 MG CHEW Chew 50 mg by mouth daily as needed (dizziness).     Ascorbic Acid (VITAMIN C WITH ROSE HIPS) 500 MG tablet Take 500 mg by mouth daily.     isosorbide mononitrate (IMDUR) 30 MG 24 hr tablet Take 30 mg by mouth every morning.   3   No current facility-administered medications for this visit.     Past  Medical History:  Diagnosis Date   Arthritis    hands   Benign neoplasm of pituitary gland ([place])    per last mri 01-30-2015  slight decrease macro adenoma   Bladder tumor    Borderline diabetes mellitus    Cardiogenic shock ([place])    a. 05/2011 requiring pressor therapy.   Carotid artery disease ([place])    per last duplex in epic 05-06-2015   bilateral ICA 1-39%   Constipation    Coronary artery disease cardiologist-  dr Bronson Ing   a. NSTEMI 05/2011 s/p DES to LAD, with ?Takotsubo distribution of cardiomyopathy at that time with cardiogenic shock. b. NSTEMI 06/2011 ?spasm - stable CAD, normal EF. c. Patent LAD stent, nonobst CAD by cath 08/20/12 with normal EF. d. Hx dyspnea with Brilinta. e. patent stent by cath in 01/2015   DOE (dyspnea on exertion)    Dyspnea    with exertion and also noted some at rest   Erosive gastritis    Fatigue    GERD (gastroesophageal reflux disease)    Heart murmur    Hematuria    passes blood clots occ   Hemorrhoids    Hiatal hernia    History of gastritis 05/2014   w/ erosions   History of GI bleed 05/2015  s/p  EGD with ablation duodenal polyp and gastric polypectomy with post gi bleed , s/p clipping   History of non-ST elevation myocardial infarction (NSTEMI) 05/11/2011   w/ cardiogenic shock requiring pressor therapy,  and s/p  PCI and DES to LAD   History of skin cancer    HOH (hard of hearing)    bilateral wears hearing aides   Hyperlipidemia    Hypertension    Hypertrophic cardiomyopathy ([place])    a. echo 08/2012: focal basal septal hypertrophy, mildly elevated gradient 62mmHg across LVOT.   Iron deficiency anemia due to chronic blood loss    hemotologist --- dr Lauree Chandler @ [facility] office in [place],, hx IV iron infusions   Melanoma ([place])    nose   Mild obstructive sleep apnea    per pt no cpap recommendation   Myocardial infarction [facility])    Nasal polyp    Neuropathy, peripheral    Nocturia more than  twice per night    OAB (overactive bladder)    Osteoporosis    Pneumonia    childhood   Polyp of stomach and duodenum    Poor circulation    lower ext bil   Pre-diabetes    S/P drug eluting coronary stent placement 05/12/2011   to mid LAD   Scoliosis    Stress-induced cardiomyopathy 05/2011   a. ?Takotubso cardiomyopathy - distribution was different than that of LAD stenosis.   Unsteady gait    Urinary incontinence    Vertigo, intermittent    Vitamin D deficiency    Wears hearing aid in both ears     Past Surgical History:  Procedure Laterality Date   [place]  06/22/2011   patent LAD stent. Normal EF   CARDIAC CATHETERIZATION  08/2012   Patent LAD stent with normal ejection fraction.   CARDIAC CATHETERIZATION N/A 01/19/2015   Procedure: Right/Left Heart Cath and Coronary Angiography;  Surgeon: Jettie Booze, MD;  Location: [facility];  Service: Cardiovascular;  Laterality: N/A;   CARDIOVASCULAR STRESS TEST  11-26-2014   dr Bronson Ing   Low risk nuclear study w/ no ischemia or infarct/  normal LV function and wall motion , ef 72%   CATARACT EXTRACTION W/ INTRAOCULAR LENS  IMPLANT, BILATERAL  2009  approx.   COLONOSCOPY     CORONARY ANGIOPLASTY WITH STENT PLACEMENT  05-12-2011  dr Shelva Majestic   mid LAD: 2.5 X 16 mm Promus DES for 85-90% stenosis/  pCFx 30-40%, diagonal 20%, ef 40-45%/ ascending aorta stenosis 72mm gradient/  hypocontratility involve distal anterolateral apical and distal inferior wall   ESOPHAGOGASTRODUODENOSCOPY N/A 05/23/2014   Procedure: ESOPHAGOGASTRODUODENOSCOPY (EGD);  Surgeon: Rogene Houston, MD;  [facility];  Service: Endoscopy;  Laterality: N/A;  230 - moved to 1:05 - Ann notified pt   ESOPHAGOGASTRODUODENOSCOPY N/A 05/20/2015   Procedure: ESOPHAGOGASTRODUODENOSCOPY (EGD);  Surgeon: Rogene Houston, MD;  [facility];  Service: Endoscopy;  Laterality: N/A;   1:00   ESOPHAGOGASTRODUODENOSCOPY N/A 05/28/2015   Procedure: ESOPHAGOGASTRODUODENOSCOPY (EGD);  Surgeon: Rogene Houston, MD;  [facility];  Service: Endoscopy;  Laterality: N/A;   GIVENS CAPSULE STUDY N/A 05/12/2014   Procedure: GIVENS CAPSULE STUDY;  Surgeon: Rogene Houston, MD;  [facility];  Service: Endoscopy;  Laterality: N/A;  730   LEFT HEART CATHETERIZATION WITH CORONARY ANGIOGRAM N/A 05/12/2011   Procedure: LEFT HEART CATHETERIZATION WITH CORONARY ANGIOGRAM;  Surgeon: Troy Sine, MD;  Location: [facility];  Service: Cardiovascular;  Laterality: N/A;   LEFT HEART CATHETERIZATION WITH CORONARY ANGIOGRAM N/A 06/22/2011   Procedure: LEFT HEART CATHETERIZATION WITH CORONARY ANGIOGRAM;  Surgeon: Wellington Hampshire, MD;  Location: [facility];  Service: Cardiovascular;  Laterality: N/A;   LEFT HEART CATHETERIZATION WITH CORONARY ANGIOGRAM N/A 08/20/2012   Procedure: LEFT HEART CATHETERIZATION WITH CORONARY ANGIOGRAM;  Surgeon: Burnell Blanks, MD;  Location: [facility] CATH LAB;  Service: Cardiovascular;  Laterality: N/A;   PARTIAL OOPHORECTOMY Right 1955   BENIGN   PERCUTANEOUS CORONARY STENT INTERVENTION (PCI-S)  05/12/2011   Procedure: PERCUTANEOUS CORONARY STENT INTERVENTION (PCI-S);  Surgeon: Troy Sine, MD;  Location: [facility] CATH LAB;  Service: Cardiovascular;;   POLYPECTOMY N/A 05/20/2015   Procedure: EGD with polypectomy;  Surgeon: Rogene Houston, MD;  [facility];  Service: Endoscopy;  Laterality: N/A;   TRANSTHORACIC ECHOCARDIOGRAM  10-09-2017   dr Bronson Ing   ef 55-60%, moderate conentric LVH, grade 2 diastolic dysfunction/  basal septal hypertrophy with systolic anterior motion of the mitral valve outflow track peak grandient greater than 168mmHg with valsalva/  mild AR, MR, and TR   TRANSURETHRAL RESECTION OF BLADDER TUMOR N/A 04/30/2018   Procedure: TRANSURETHRAL RESECTION OF BLADDER TUMOR (TURBT);  Surgeon: Kathie Rhodes, MD;   Location: [facility];  Service: Urology;  Laterality: N/A;   WISDOM TOOTH EXTRACTION      Social History   Socioeconomic History   Marital status: Married    Spouse name: Not on file   Number of children: Not on file   Years of education: Not on file   Highest education level: Not on file  Occupational History   Not on file  Social Needs   Financial resource strain: Not on file   Food insecurity    Worry: Not on file    Inability: Not on file   Transportation needs    Medical: Not on file    Non-medical: Not on file  Tobacco Use   Smoking status: Former Smoker    Packs/day: 1.00    Years: 12.00    Pack years: 12.00    Types: Cigarettes    Quit date: 05/11/1979    Years since quitting: 39.8   Smokeless tobacco: Never Used  Substance and Sexual Activity   Alcohol use: No   Drug use: No   Sexual activity: Yes    Birth control/protection: Post-menopausal  Lifestyle   Physical activity    Days per week: Not on file    Minutes per session: Not on file   Stress: Not on file  Relationships   Social connections    Talks on phone: Not on file    Gets together: Not on file    Attends religious service: Not on file    Active member of club or organization: Not on file    Attends meetings of clubs or organizations: Not on file    Relationship status: Not on file   Intimate partner violence    Fear of current or ex partner: Not on file    Emotionally abused: Not on file    Physically abused: Not on file    Forced sexual activity: Not on file  Other Topics Concern   Not on file  Social History Narrative   Not on file     Vitals:   02/21/19 1142  BP: (!) 167/72  Pulse: 61  SpO2: 97%  Weight: 115 lb (52.2 kg)  Height: 5\' 4"  (1.626 m)  Wt Readings from Last 3 Encounters:  02/21/19 115 lb (52.2 kg)  08/07/18 117 lb (53.1 kg)  05/16/18 122 lb 12.8 oz (55.7 kg)     PHYSICAL EXAM General: NAD, frail-appearing. HEENT: Hard of hearing. Neck: No  JVD, no thyromegaly. Lungs: Clear to auscultation bilaterally with normal respiratory effort. CV: Regular rate and rhythm, normal S1/S2, no S3/S4, no murmur. No pretibial or periankle edema.    Abdomen: Soft, nontender, no distention.  Neurologic: Alert and oriented.  Psych: Normal affect. Skin: Normal. Musculoskeletal: No gross deformities.    ECG: Reviewed above under Subjective   Labs: Lab Results  Component Value Date/Time   K 4.6 04/30/2018 01:25 PM   BUN 19 04/30/2018 01:25 PM   CREATININE 0.57 04/30/2018 01:25 PM   ALT 14 04/30/2018 01:25 PM   HGB 14.6 04/30/2018 01:25 PM     Lipids: No results found for: LDLCALC, LDLDIRECT, CHOL, TRIG, HDL     ASSESSMENT AND PLAN:  1.  Coronary artery disease: Status post drug-eluting stent placement to the LAD in 2012 with patent stent by repeat cardiac catheterization July 2016.  She underwent a low risk nuclear stress test in October 2019.  Symptomatically stable.  Continue aspirin, statin, and Imdur.  No longer on amlodipine.  I will resume given elevated blood pressure.  2.  Bradycardia: Resolved with discontinuation of metoprolol.  Symptoms of dyspnea have resolved.  Continue to avoid AV nodal blocking agents altogether.  3.  Hypertension: Blood pressure is elevated.  She is no longer on amlodipine.  I will resume 5 mg daily given elevated blood pressure.  4.  Hyperlipidemia: She remains on atorvastatin 10 mg daily.  5.  Carotid artery stenosis: Minimal stenosis by Doppler studies in November 2016.  No plans to repeat imaging given minimal stenosis and advanced age.  6.  Hearing loss: This was quite sudden in onset.  She is scheduled to see an ENT specialist on September 3.   Disposition: Follow up 6 months with [facility]   [name], M.D., F.A.C.C.

## 2019-03-04 DIAGNOSIS — E785 Hyperlipidemia, unspecified: Secondary | ICD-10-CM | POA: Diagnosis not present

## 2019-03-04 DIAGNOSIS — I10 Essential (primary) hypertension: Secondary | ICD-10-CM | POA: Diagnosis not present

## 2019-03-04 DIAGNOSIS — E119 Type 2 diabetes mellitus without complications: Secondary | ICD-10-CM | POA: Diagnosis not present

## 2019-03-07 ENCOUNTER — Other Ambulatory Visit: Payer: Self-pay

## 2019-03-07 ENCOUNTER — Ambulatory Visit (INDEPENDENT_AMBULATORY_CARE_PROVIDER_SITE_OTHER): Payer: Medicare Other | Admitting: Otolaryngology

## 2019-03-07 DIAGNOSIS — J343 Hypertrophy of nasal turbinates: Secondary | ICD-10-CM

## 2019-03-07 DIAGNOSIS — H906 Mixed conductive and sensorineural hearing loss, bilateral: Secondary | ICD-10-CM

## 2019-03-07 DIAGNOSIS — H6983 Other specified disorders of Eustachian tube, bilateral: Secondary | ICD-10-CM

## 2019-03-07 DIAGNOSIS — H6523 Chronic serous otitis media, bilateral: Secondary | ICD-10-CM

## 2019-03-07 DIAGNOSIS — J342 Deviated nasal septum: Secondary | ICD-10-CM

## 2019-03-13 DIAGNOSIS — C678 Malignant neoplasm of overlapping sites of bladder: Secondary | ICD-10-CM | POA: Diagnosis not present

## 2019-03-13 DIAGNOSIS — C672 Malignant neoplasm of lateral wall of bladder: Secondary | ICD-10-CM | POA: Diagnosis not present

## 2019-03-13 DIAGNOSIS — Z923 Personal history of irradiation: Secondary | ICD-10-CM | POA: Diagnosis not present

## 2019-04-04 ENCOUNTER — Ambulatory Visit (INDEPENDENT_AMBULATORY_CARE_PROVIDER_SITE_OTHER): Payer: Medicare Other | Admitting: Otolaryngology

## 2019-04-04 ENCOUNTER — Other Ambulatory Visit: Payer: Self-pay

## 2019-04-04 DIAGNOSIS — J310 Chronic rhinitis: Secondary | ICD-10-CM | POA: Diagnosis not present

## 2019-04-04 DIAGNOSIS — J342 Deviated nasal septum: Secondary | ICD-10-CM | POA: Diagnosis not present

## 2019-04-04 DIAGNOSIS — H6983 Other specified disorders of Eustachian tube, bilateral: Secondary | ICD-10-CM | POA: Diagnosis not present

## 2019-04-04 DIAGNOSIS — J343 Hypertrophy of nasal turbinates: Secondary | ICD-10-CM

## 2019-04-04 DIAGNOSIS — H906 Mixed conductive and sensorineural hearing loss, bilateral: Secondary | ICD-10-CM

## 2019-04-04 DIAGNOSIS — H6523 Chronic serous otitis media, bilateral: Secondary | ICD-10-CM

## 2019-04-07 IMAGING — NM NM MYOCAR MULTI W/SPECT W/WALL MOTION & EF
2 series · 12 of 12 positions shown · non-contrast
Comparison: none

[Series 1: rest · 6.51mm/px · 6 of 64 frames shown]
[frame 6/64]
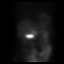
[frame 16/64]
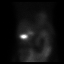
[frame 27/64]
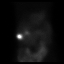
[frame 38/64]
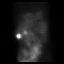
[frame 48/64]
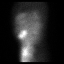
[frame 59/64]
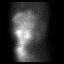

[Series 3: stress gated - perfusion · 6.51mm/px · 6 of 64 frames shown]
[frame 6/64]
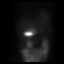
[frame 16/64]
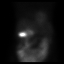
[frame 27/64]
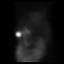
[frame 38/64]
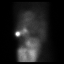
[frame 48/64]
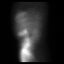
[frame 59/64]
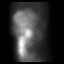

[12 of 12 positions shown; findings below may reference images not displayed]

Canned report from images found in remote index.

Refer to host system for actual result text.

## 2019-04-11 DIAGNOSIS — Z23 Encounter for immunization: Secondary | ICD-10-CM | POA: Diagnosis not present

## 2019-04-22 DIAGNOSIS — E538 Deficiency of other specified B group vitamins: Secondary | ICD-10-CM | POA: Diagnosis not present

## 2019-04-22 DIAGNOSIS — D500 Iron deficiency anemia secondary to blood loss (chronic): Secondary | ICD-10-CM | POA: Diagnosis not present

## 2019-04-22 DIAGNOSIS — I422 Other hypertrophic cardiomyopathy: Secondary | ICD-10-CM | POA: Diagnosis not present

## 2019-04-22 DIAGNOSIS — C672 Malignant neoplasm of lateral wall of bladder: Secondary | ICD-10-CM | POA: Diagnosis not present

## 2019-04-22 DIAGNOSIS — E559 Vitamin D deficiency, unspecified: Secondary | ICD-10-CM | POA: Diagnosis not present

## 2019-05-02 IMAGING — DX DG CHEST 1V PORT
1 series · 1 of 1 positions shown · non-contrast
Comparison: 01/12/2015

CLINICAL DATA: Postop hypoxia

EXAM:
PORTABLE CHEST 1 VIEW

[chest ap]
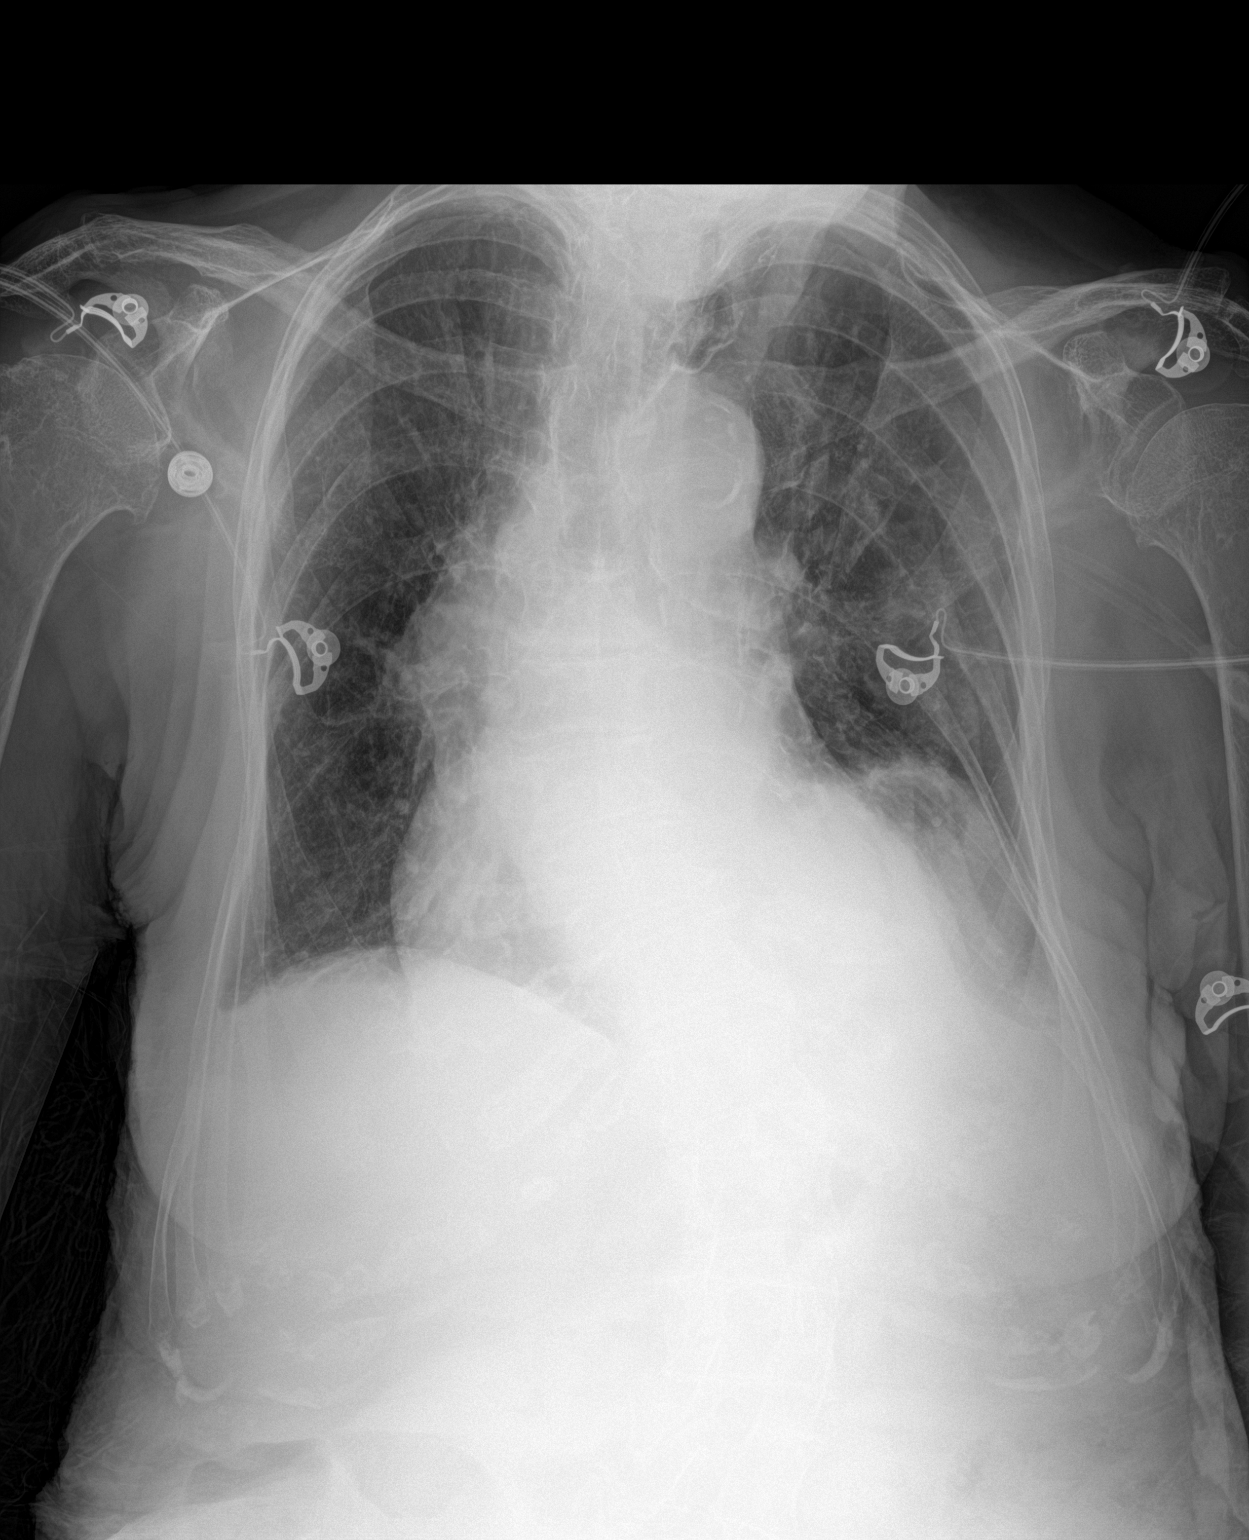

[1 of 1 positions shown; findings below may reference images not displayed]

FINDINGS: Left basilar density compatible with large hiatal hernia. Cardiac
enlargement without heart failure. Atherosclerotic aorta.

Mild atelectasis in the bases.  Negative for pneumonia.
IMPRESSION: Bibasilar atelectasis and large hiatal hernia.

## 2019-05-06 ENCOUNTER — Ambulatory Visit (INDEPENDENT_AMBULATORY_CARE_PROVIDER_SITE_OTHER): Payer: Medicare Other | Admitting: Otolaryngology

## 2019-05-06 DIAGNOSIS — H66013 Acute suppurative otitis media with spontaneous rupture of ear drum, bilateral: Secondary | ICD-10-CM

## 2019-05-06 DIAGNOSIS — H7203 Central perforation of tympanic membrane, bilateral: Secondary | ICD-10-CM | POA: Diagnosis not present

## 2019-05-07 DIAGNOSIS — R5383 Other fatigue: Secondary | ICD-10-CM | POA: Diagnosis not present

## 2019-05-07 DIAGNOSIS — D509 Iron deficiency anemia, unspecified: Secondary | ICD-10-CM | POA: Diagnosis not present

## 2019-05-07 DIAGNOSIS — R0902 Hypoxemia: Secondary | ICD-10-CM | POA: Diagnosis not present

## 2019-05-07 DIAGNOSIS — D649 Anemia, unspecified: Secondary | ICD-10-CM | POA: Diagnosis not present

## 2019-05-07 DIAGNOSIS — E782 Mixed hyperlipidemia: Secondary | ICD-10-CM | POA: Diagnosis not present

## 2019-05-07 DIAGNOSIS — I10 Essential (primary) hypertension: Secondary | ICD-10-CM | POA: Diagnosis not present

## 2019-05-07 DIAGNOSIS — R42 Dizziness and giddiness: Secondary | ICD-10-CM | POA: Diagnosis not present

## 2019-05-15 DIAGNOSIS — E1140 Type 2 diabetes mellitus with diabetic neuropathy, unspecified: Secondary | ICD-10-CM | POA: Diagnosis not present

## 2019-05-15 DIAGNOSIS — J449 Chronic obstructive pulmonary disease, unspecified: Secondary | ICD-10-CM | POA: Diagnosis not present

## 2019-05-15 DIAGNOSIS — C679 Malignant neoplasm of bladder, unspecified: Secondary | ICD-10-CM | POA: Diagnosis not present

## 2019-05-15 DIAGNOSIS — I700 Atherosclerosis of aorta: Secondary | ICD-10-CM | POA: Diagnosis not present

## 2019-05-15 DIAGNOSIS — J479 Bronchiectasis, uncomplicated: Secondary | ICD-10-CM | POA: Diagnosis not present

## 2019-05-15 DIAGNOSIS — I5032 Chronic diastolic (congestive) heart failure: Secondary | ICD-10-CM | POA: Diagnosis not present

## 2019-05-15 DIAGNOSIS — R0902 Hypoxemia: Secondary | ICD-10-CM | POA: Diagnosis not present

## 2019-05-15 DIAGNOSIS — I10 Essential (primary) hypertension: Secondary | ICD-10-CM | POA: Diagnosis not present

## 2019-05-16 DIAGNOSIS — D414 Neoplasm of uncertain behavior of bladder: Secondary | ICD-10-CM | POA: Diagnosis not present

## 2019-05-16 DIAGNOSIS — R310 Gross hematuria: Secondary | ICD-10-CM | POA: Diagnosis not present

## 2019-05-20 DIAGNOSIS — C679 Malignant neoplasm of bladder, unspecified: Secondary | ICD-10-CM | POA: Diagnosis not present

## 2019-05-20 DIAGNOSIS — C678 Malignant neoplasm of overlapping sites of bladder: Secondary | ICD-10-CM | POA: Diagnosis not present

## 2019-05-27 ENCOUNTER — Other Ambulatory Visit: Payer: Self-pay

## 2019-05-27 ENCOUNTER — Other Ambulatory Visit: Payer: Self-pay | Admitting: Urology

## 2019-05-27 ENCOUNTER — Ambulatory Visit (INDEPENDENT_AMBULATORY_CARE_PROVIDER_SITE_OTHER): Payer: Medicare Other | Admitting: Otolaryngology

## 2019-05-27 DIAGNOSIS — C678 Malignant neoplasm of overlapping sites of bladder: Secondary | ICD-10-CM | POA: Diagnosis not present

## 2019-05-27 DIAGNOSIS — R310 Gross hematuria: Secondary | ICD-10-CM | POA: Diagnosis not present

## 2019-06-05 ENCOUNTER — Other Ambulatory Visit: Payer: Self-pay

## 2019-06-05 ENCOUNTER — Encounter (HOSPITAL_BASED_OUTPATIENT_CLINIC_OR_DEPARTMENT_OTHER): Payer: Self-pay | Admitting: *Deleted

## 2019-06-05 DIAGNOSIS — D500 Iron deficiency anemia secondary to blood loss (chronic): Secondary | ICD-10-CM | POA: Diagnosis not present

## 2019-06-05 DIAGNOSIS — C672 Malignant neoplasm of lateral wall of bladder: Secondary | ICD-10-CM | POA: Diagnosis not present

## 2019-06-05 DIAGNOSIS — R0902 Hypoxemia: Secondary | ICD-10-CM | POA: Diagnosis not present

## 2019-06-05 DIAGNOSIS — R310 Gross hematuria: Secondary | ICD-10-CM | POA: Diagnosis not present

## 2019-06-05 NOTE — Progress Notes (Signed)
Left message with connie mabe at dr Karsten Ro office, patient must be moved to main or due to uses oxygen 3 liters at hs and prn during day

## 2019-06-05 NOTE — Progress Notes (Addendum)
Spoke with patient and husband via telephone for pre op interview. Patient very [place]. If possible husband Lorenso Courier) would be helpful in pre op. NPO after MN. Patient to take Norvasc and Imdur AM of surgery with a sip of water. Will need EKG AM of surgery.  Arrival time 0930.  Chart given to Konrad Felix, PA for anesthesia review. Patient wears 3L o2  Every night and at times during the day per her husband. After procedure notes in 2019 patient was admitted for obs after due to inability to maintain adequate sats without oxygen. Per husband the oxygen therapy began soon after.

## 2019-06-06 ENCOUNTER — Other Ambulatory Visit (HOSPITAL_COMMUNITY)
Admission: RE | Admit: 2019-06-06 | Discharge: 2019-06-06 | Disposition: A | Discharge status: Home / Self Care | Payer: Medicare Other | Source: Ambulatory Visit | Attending: Urology | Admitting: Urology

## 2019-06-06 DIAGNOSIS — Z20828 Contact with and (suspected) exposure to other viral communicable diseases: Secondary | ICD-10-CM | POA: Insufficient documentation

## 2019-06-06 DIAGNOSIS — Z01812 Encounter for preprocedural laboratory examination: Secondary | ICD-10-CM | POA: Insufficient documentation

## 2019-06-06 LAB — SARS CORONAVIRUS 2 (TAT 6-24 HRS): SARS Coronavirus 2: NEGATIVE

## 2019-06-07 NOTE — H&P (Signed)
HeHPI: Victoria Short is a 85 year old female with a history of bladder cancer who has what appears to be a recurrence and has been experiencing persistent gross hematuria.  Her bladder cancer was not superficial and limitied to the bladder lining. Her bladder cancer was muscle invasive.   She did have a TURBT. Her last bladder tumor was resected 04/30/2018. She has had the following number of bladder resections: 1. She did have radiation to treat her bladder cancer. Her radiation treatment was complete 08/05/2018.   She has not had blood in her urine recently. She is not having new bone pain. She has not recently had unwanted weight loss.   Her last cysto was 11/08/2018. Her last radiologic test to evaluate the kidneys was 09/19/2018.   Bladder cancer: She is grossly hematuria intermittently over 4 months and underwent a CT scan which revealed no abnormality of the upper tract and no adenopathy.  TURBT 04/30/18: I resected 2 primarily solid tumors from the right wall and superior/dome region.  Pathology: Infiltrating high-grade urothelial carcinoma with involvement of the muscularis. There was also lymphovascular invasion.  Stage: T2  Treatment: Radiation without chemotherapy completed in 2/20.  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 She said she has been having some intermittent hematuria that is very mild. It is not associated with any clots. It occurs nearly every day. She denies any unexplained weight loss, pain in her bones or pulmonary complaints.     ALLERGIES: shell fish    MEDICATIONS: Aspirin 81 mg tablet,chewable  Atorvastatin Calcium 10 mg tablet  Calcium  Focus Factor  Iron 236 mg (27 mg iron) tablet  Isosorbide Mononitrate Er 30 mg tablet, extended release 24 hr  Stool  Softener  Vitamin C  Vitamin D3     GU PSH: Cystoscopy - 11/08/2018, 03/27/2018 Cystoscopy TURBT 2-5 cm - 04/30/2018     NON-GU PSH: None   GU PMH: Bladder Cancer overlapping sites, She is interested in learning about chemo radiation so I will schedule her for an appointment with Dr. Tammi Klippel to discuss this further. - 05/08/2018 Detrusor overactivity, She describes what sounds like moderate bladder overactivity/bladder spasm so I am going to give her samples of Myrbetriq 25 mg today to see if this helps. - 05/08/2018 Bladder tumor/neoplasm, She has at least 2 large bladder tumors involving the right wall of her bladder as well as multiple areas of papillary abnormality involving the dome and posterior wall the bladder as well. - 03/27/2018    NON-GU PMH: None   FAMILY HISTORY: None   SOCIAL HISTORY: Marital Status: Married Preferred Language: English; Ethnicity: Not Hispanic Or Latino; Race: White Current Smoking Status: Patient does not smoke anymore.   Tobacco Use Assessment Completed: Used Tobacco in last 30 days? Has never drank.  Drinks 2 caffeinated drinks per day.    REVIEW OF SYSTEMS:    GU Review Female:   Patient denies frequent urination, hard to postpone urination, burning /pain with urination, get up at night to urinate, leakage of urine, stream starts and stops, trouble starting your stream, have to strain to urinate, and being pregnant.  Gastrointestinal (Upper):   Patient denies nausea, indigestion/ heartburn, and vomiting.  Gastrointestinal (Lower):   Patient denies diarrhea and constipation.  Constitutional:   Patient denies fever, night sweats, weight loss, and fatigue.  Skin:   Patient denies skin rash/ lesion and itching.  Eyes:   Patient denies blurred vision and double vision.  Ears/ Nose/ Throat:   Patient denies sore throat and sinus problems.  Hematologic/Lymphatic:   Patient denies swollen glands and easy bruising.  Cardiovascular:   Patient denies leg  swelling and chest pains.  Respiratory:   Patient denies cough and shortness of breath.  Endocrine:   Patient denies excessive thirst.  Musculoskeletal:   Patient denies back pain and joint pain.  Neurological:   Patient denies headaches and dizziness.  Psychologic:   Patient denies depression and anxiety.   VITAL SIGNS:    Weight 115 lb / 52.16 kg  Height 67 in / 170.18 cm  BP 188/81 mmHg  Pulse 71 /min  Temperature 97.7 F / 36.5 C  BMI 18.0 kg/m   GU PHYSICAL EXAMINATION:    External Genitalia: Atrophic introitus. No hirsuitism, no rash, no scarring, no cyst, no erythematous lesion, no papular lesion, no blanched lesion, no warty lesion, no labial adhesions. No edema.   Urethral Meatus: Normal size. Normal position. No discharge.  Urethra: No tenderness, no mass, no scarring. No hypermobility. No leakage.   MULTI-SYSTEM PHYSICAL EXAMINATION:    Constitutional: Well-nourished. No physical deformities. Normally developed. Good grooming.  Neck: Neck symmetrical, not swollen. Normal tracheal position.  Respiratory: No labored breathing, no use of accessory muscles.   Cardiovascular: Normal temperature, normal extremity pulses, no swelling, no varicosities.  Lymphatic: No enlargement of neck, axillae, groin.  Skin: No paleness, no jaundice, no cyanosis. No lesion, no ulcer, no rash.  Neurologic / Psychiatric: Oriented to time, oriented to place, oriented to person. No depression, no anxiety, no agitation.  Gastrointestinal: No mass, no tenderness, no rigidity, non obese abdomen.  Eyes: Normal conjunctivae. Normal eyelids.  Ears, Nose, Mouth, and Throat: Left ear no scars, no lesions, no masses. Right ear no scars, no lesions, no masses. Nose no scars, no lesions, no masses. Normal hearing. Normal lips.  Musculoskeletal: Normal gait and station of head and neck.       PAST DATA REVIEWED:  Source Of History:  Patient  Lab Test Review:   BUN/Creatinine  Records Review:   Previous  Patient Records, POC Tool  Notes:                     Her creatinine on 05/07/19 was 0.64.   EXAM:  CT ABDOMEN AND PELVIS WITH CONTRAST     TECHNIQUE:  Multidetector CT imaging of the abdomen and pelvis was performed  using the standard protocol following bolus administration of  intravenous contrast.     CONTRAST:  125 cc Omnipaque 300 IV.     COMPARISON:  09/19/2018 PET-CT.     FINDINGS:  Scan limited by motion degradation and by patient inability to lie  prone on the delayed sequence.     Lower chest: Stable compressive atelectasis at the left lung base  due to the large hiatal hernia. No acute abnormality at the lung  bases. Cardiomegaly. Coronary atherosclerosis.     Hepatobiliary: Normal liver size. Scattered tiny granulomatous  calcifications in the liver. No liver masses. Normal gallbladder  with no radiopaque cholelithiasis. No biliary ductal dilatation.     Pancreas: Normal, with no mass or duct dilation.     Spleen: Normal size spleen. Several scattered subcentimeter  hypodense splenic lesions, too small to characterize, not  appreciably changed, presumably benign. No appreciable new splenic  lesions.  Adrenals/Urinary Tract: Normal adrenals. No hydronephrosis. A few  scattered subcentimeter hypodense renal cortical lesions in the  right kidney are too small to characterize and require no follow-up.  No left renal cortical masses. Normal caliber ureters. On delayed  imaging, there is no urothelial wall thickening and there are no  filling defects in the opacified portions of the bilateral  collecting systems or ureters. Nondistended bladder. Asymmetric  masslike wall thickening in right anterior bladder wall measuring  3.6 x 2.5 x 3.1 cm (series 2/image 71).     Stomach/Bowel: Large hiatal hernia. Otherwise normal nondistended  stomach. Normal caliber small bowel with no small bowel wall  thickening. Appendectomy. Scattered moderate colonic diverticulosis,   most prominent in the sigmoid colon. No large bowel wall thickening  or acute pericolonic fat stranding.     Vascular/Lymphatic: Atherosclerotic nonaneurysmal abdominal aorta.  Patent portal, splenic, hepatic and renal veins. No pathologically  enlarged lymph nodes in the abdomen or pelvis.     Reproductive: Grossly normal uterus.  No adnexal mass.     Other: No pneumoperitoneum, ascites or focal fluid collection.     Musculoskeletal: No aggressive appearing focal osseous lesions. Mild  thoracolumbar spondylosis.     IMPRESSION:  1. Asymmetric masslike wall thickening in the right anterior bladder  wall, cannot exclude residual or recurrent bladder tumor.  2. No evidence of upper tract urothelial lesions. No hydronephrosis.  No suspicious renal cortical masses.  3. No findings of metastatic disease in the abdomen or pelvis.  4. Chronic findings include: Aortic Atherosclerosis (ICD10-I70.0).  Cardiomegaly. Coronary atherosclerosis. Large hiatal hernia.  Moderate colonic diverticulosis.    PROCEDURES:         Flexible Cystoscopy - performed on 05/16/19 Risks, benefits, and some of the potential complications of the procedure were discussed at length with the patient including infection, bleeding, voiding discomfort and others. All questions were answered. Sterile technique and intraurethral analgesia were used.  Meatus:  Normal size. Normal location. Normal condition.  Urethra:  No hypermobility. No leakage.  Ureteral Orifices:  Patulous right orifice. Normal location. Normal size. Effluxed clear urine.  Bladder:  The mucosa was somewhat pale consistent with the effects of radiation. I did note on the right wall of the bladder there appeared to be a mass within attached clot and also some dystrophic calcification.      The lower urinary tract was carefully examined. The procedure was well-tolerated and without complications. Instructions were given to call the office immediately for  bloody urine, difficulty urinating, urinary retention, painful or frequent urination, fever or other illness. The patient stated that she understood these instructions and would comply with them.         Urinalysis w/Scope Dipstick Dipstick Cont'd Micro  Color: Brown Bilirubin: Neg mg/dL WBC/hpf: 6 - 10/hpf  Appearance: Turbid Ketones: Neg mg/dL RBC/hpf: >60/hpf  Specific Gravity: 1.015 Blood: 3+ ery/uL Bacteria: Few (10-25/hpf)  pH: 8.5 Protein: 3+ mg/dL Cystals: NS (Not Seen)  Glucose: Neg mg/dL Urobilinogen: 0.2 mg/dL Casts: NS (Not Seen)    Nitrites: Neg Trichomonas: Not Present    Leukocyte Esterase: 1+ leu/uL Mucous: Not Present      Epithelial Cells: 0 - 5/hpf      Yeast: NS (Not Seen)      Sperm: Not Present     ASSESSMENT/PLAN:     ICD-10 Details  1 GU:   Bladder tumor/neoplasm - D41.4 Right, She does have an area on the right wall of her bladder that is a  possible recurrence. It may be that it is just inflammation surrounding dystrophic calcification but I am going to evaluate this further with a CT scan and will contact her with the results.  2   Gross hematuria - R31.0 She is having gross hematuria daily but it is very mild. If he does have recurrence I do not think a resection would be curative and therefore I would only undertake a resection if he continues to have significant gross hematuria since she is 14.  A preop urine culture was positive for strep and the patient was placed on Augmentin.

## 2019-06-10 ENCOUNTER — Encounter (HOSPITAL_COMMUNITY): Payer: Self-pay | Admitting: Anesthesiology

## 2019-06-10 ENCOUNTER — Ambulatory Visit (HOSPITAL_COMMUNITY): Payer: Medicare Other | Admitting: Anesthesiology

## 2019-06-10 ENCOUNTER — Other Ambulatory Visit: Payer: Self-pay

## 2019-06-10 ENCOUNTER — Ambulatory Visit (HOSPITAL_COMMUNITY)
Admission: RE | Admit: 2019-06-10 | Discharge: 2019-06-10 | Disposition: A | Discharge status: Home / Self Care | Payer: Medicare Other | Attending: Urology | Admitting: Urology

## 2019-06-10 ENCOUNTER — Encounter (HOSPITAL_COMMUNITY)
Admission: RE | Disposition: A | Discharge status: Home / Self Care | Payer: Self-pay | Source: Home / Self Care | Attending: Urology

## 2019-06-10 DIAGNOSIS — Z79899 Other long term (current) drug therapy: Secondary | ICD-10-CM | POA: Diagnosis not present

## 2019-06-10 DIAGNOSIS — C672 Malignant neoplasm of lateral wall of bladder: Secondary | ICD-10-CM

## 2019-06-10 DIAGNOSIS — G4730 Sleep apnea, unspecified: Secondary | ICD-10-CM | POA: Diagnosis not present

## 2019-06-10 DIAGNOSIS — C679 Malignant neoplasm of bladder, unspecified: Secondary | ICD-10-CM | POA: Diagnosis not present

## 2019-06-10 DIAGNOSIS — E785 Hyperlipidemia, unspecified: Secondary | ICD-10-CM | POA: Diagnosis not present

## 2019-06-10 DIAGNOSIS — Z7982 Long term (current) use of aspirin: Secondary | ICD-10-CM | POA: Insufficient documentation

## 2019-06-10 DIAGNOSIS — G709 Myoneural disorder, unspecified: Secondary | ICD-10-CM | POA: Diagnosis not present

## 2019-06-10 DIAGNOSIS — I252 Old myocardial infarction: Secondary | ICD-10-CM | POA: Diagnosis not present

## 2019-06-10 DIAGNOSIS — Z923 Personal history of irradiation: Secondary | ICD-10-CM | POA: Diagnosis not present

## 2019-06-10 DIAGNOSIS — I2510 Atherosclerotic heart disease of native coronary artery without angina pectoris: Secondary | ICD-10-CM | POA: Insufficient documentation

## 2019-06-10 DIAGNOSIS — Z8551 Personal history of malignant neoplasm of bladder: Secondary | ICD-10-CM | POA: Diagnosis not present

## 2019-06-10 DIAGNOSIS — Z87891 Personal history of nicotine dependence: Secondary | ICD-10-CM | POA: Diagnosis not present

## 2019-06-10 DIAGNOSIS — I10 Essential (primary) hypertension: Secondary | ICD-10-CM | POA: Diagnosis not present

## 2019-06-10 DIAGNOSIS — Z8711 Personal history of peptic ulcer disease: Secondary | ICD-10-CM | POA: Insufficient documentation

## 2019-06-10 DIAGNOSIS — M1990 Unspecified osteoarthritis, unspecified site: Secondary | ICD-10-CM | POA: Insufficient documentation

## 2019-06-10 DIAGNOSIS — R310 Gross hematuria: Secondary | ICD-10-CM | POA: Diagnosis present

## 2019-06-10 DIAGNOSIS — D494 Neoplasm of unspecified behavior of bladder: Secondary | ICD-10-CM | POA: Diagnosis not present

## 2019-06-10 HISTORY — PX: TRANSURETHRAL RESECTION OF BLADDER TUMOR: SHX2575

## 2019-06-10 HISTORY — PX: CYSTOSCOPY: SHX5120

## 2019-06-10 LAB — BASIC METABOLIC PANEL
Anion gap: 9 (ref 5–15)
BUN: 10 mg/dL (ref 8–23)
CO2: 36 mmol/L — ABNORMAL HIGH (ref 22–32)
Calcium: 9.6 mg/dL (ref 8.9–10.3)
Chloride: 100 mmol/L (ref 98–111)
Creatinine, Ser: 0.64 mg/dL (ref 0.44–1.00)
GFR calc Af Amer: >60 mL/min (ref >60)
GFR calc non Af Amer: >60 mL/min (ref >60)
Glucose, Bld: 98 mg/dL (ref 70–99)
Potassium: 4.4 mmol/L (ref 3.5–5.1)
Sodium: 145 mmol/L (ref 135–145)

## 2019-06-10 LAB — CBC
HCT: 39 % (ref 36.0–46.0)
Hemoglobin: 12.2 g/dL (ref 12.0–15.0)
MCH: 35.4 pg — ABNORMAL HIGH (ref 26.0–34.0)
MCHC: 31.3 g/dL (ref 30.0–36.0)
MCV: 113 fL — ABNORMAL HIGH (ref 80.0–100.0)
Platelets: 175 10*3/uL (ref 150–400)
RBC: 3.45 MIL/uL — ABNORMAL LOW (ref 3.87–5.11)
RDW: 14.6 % (ref 11.5–15.5)
WBC: 3.6 10*3/uL — ABNORMAL LOW (ref 4.0–10.5)
nRBC: 0 % (ref 0.0–0.2)

## 2019-06-10 SURGERY — CYSTOSCOPY
Anesthesia: General

## 2019-06-10 MED ORDER — LACTATED RINGERS IV SOLN
INTRAVENOUS | Status: DC
  Administered 2019-06-10: 11:00:00 via INTRAVENOUS

## 2019-06-10 MED ORDER — FENTANYL CITRATE (PF) 100 MCG/2ML IJ SOLN
INTRAMUSCULAR | Status: AC
  Filled 2019-06-10: qty 2

## 2019-06-10 MED ORDER — CIPROFLOXACIN IN D5W 200 MG/100ML IV SOLN
200.0000 mg | Freq: Once | INTRAVENOUS | Status: AC
  Administered 2019-06-10: 13:00:00 200 mg via INTRAVENOUS
  Filled 2019-06-10: qty 100

## 2019-06-10 MED ORDER — MEPERIDINE HCL 50 MG/ML IJ SOLN: 6.2500 mg | INTRAMUSCULAR | Status: DC | PRN

## 2019-06-10 MED ORDER — ACETAMINOPHEN 160 MG/5ML PO SOLN: 325.0000 mg | ORAL | Status: DC | PRN

## 2019-06-10 MED ORDER — STERILE WATER FOR IRRIGATION IR SOLN: Status: DC | PRN

## 2019-06-10 MED ORDER — SODIUM CHLORIDE 0.9 % IR SOLN
Status: DC | PRN
  Administered 2019-06-10: 6000 mL
  Administered 2019-06-10: 7000 mL

## 2019-06-10 MED ORDER — GLYCOPYRROLATE PF 0.2 MG/ML IJ SOSY
PREFILLED_SYRINGE | INTRAMUSCULAR | Status: AC
  Filled 2019-06-10: qty 2

## 2019-06-10 MED ORDER — HYDROCODONE-ACETAMINOPHEN 10-325 MG PO TABS: 1.0000 | ORAL_TABLET | ORAL | 0 refills | Status: DC | PRN

## 2019-06-10 MED ORDER — ETOMIDATE 2 MG/ML IV SOLN
INTRAVENOUS | Status: DC | PRN
  Administered 2019-06-10: 10 mg via INTRAVENOUS

## 2019-06-10 MED ORDER — OXYCODONE HCL 5 MG/5ML PO SOLN: 5.0000 mg | Freq: Once | ORAL | Status: DC | PRN

## 2019-06-10 MED ORDER — ONDANSETRON HCL 4 MG/2ML IJ SOLN: 4.0000 mg | Freq: Once | INTRAMUSCULAR | Status: DC | PRN

## 2019-06-10 MED ORDER — ONDANSETRON HCL 4 MG/2ML IJ SOLN
INTRAMUSCULAR | Status: DC | PRN
  Administered 2019-06-10: 4 mg via INTRAVENOUS

## 2019-06-10 MED ORDER — FENTANYL CITRATE (PF) 100 MCG/2ML IJ SOLN
INTRAMUSCULAR | Status: DC | PRN
  Administered 2019-06-10 (×2): 50 ug via INTRAVENOUS

## 2019-06-10 MED ORDER — GLYCOPYRROLATE PF 0.2 MG/ML IJ SOSY
PREFILLED_SYRINGE | INTRAMUSCULAR | Status: DC | PRN
  Administered 2019-06-10: .2 mg via INTRAVENOUS

## 2019-06-10 MED ORDER — LIDOCAINE 2% (20 MG/ML) 5 ML SYRINGE
INTRAMUSCULAR | Status: DC | PRN
  Administered 2019-06-10: 100 mg via INTRAVENOUS

## 2019-06-10 MED ORDER — OXYCODONE HCL 5 MG PO TABS: 5.0000 mg | ORAL_TABLET | Freq: Once | ORAL | Status: DC | PRN

## 2019-06-10 MED ORDER — ACETAMINOPHEN 325 MG PO TABS: 325.0000 mg | ORAL_TABLET | ORAL | Status: DC | PRN

## 2019-06-10 MED ORDER — DEXAMETHASONE SODIUM PHOSPHATE 10 MG/ML IJ SOLN
INTRAMUSCULAR | Status: DC | PRN
  Administered 2019-06-10: 4 mg via INTRAVENOUS

## 2019-06-10 MED ORDER — FENTANYL CITRATE (PF) 100 MCG/2ML IJ SOLN: 25.0000 ug | INTRAMUSCULAR | Status: DC | PRN

## 2019-06-10 MED ORDER — PHENAZOPYRIDINE HCL 200 MG PO TABS: 200.0000 mg | ORAL_TABLET | Freq: Three times a day (TID) | ORAL | 0 refills | Status: DC | PRN

## 2019-06-10 SURGICAL SUPPLY — 21 items
BAG URINE DRAIN 2000ML AR STRL (UROLOGICAL SUPPLIES) IMPLANT
BAG URO CATCHER STRL LF (MISCELLANEOUS) ×2 IMPLANT
CATH INTERMIT  6FR 70CM (CATHETERS) ×2 IMPLANT
CLOTH BEACON ORANGE TIMEOUT ST (SAFETY) ×2 IMPLANT
ELECT BIVAP BIPO 22/24 DONUT (ELECTROSURGICAL) ×2
ELECT REM PT RETURN 15FT ADLT (MISCELLANEOUS) ×2 IMPLANT
ELECTRD BIVAP BIPO 22/24 DONUT (ELECTROSURGICAL) ×1 IMPLANT
EVACUATOR MICROVAS BLADDER (UROLOGICAL SUPPLIES) ×2 IMPLANT
GLOVE BIOGEL M 8.0 STRL (GLOVE) ×2 IMPLANT
GOWN STRL REUS W/TWL XL LVL3 (GOWN DISPOSABLE) ×2 IMPLANT
GUIDEWIRE STR DUAL SENSOR (WIRE) ×2 IMPLANT
HOLDER FOLEY CATH W/STRAP (MISCELLANEOUS) IMPLANT
KIT TURNOVER KIT A (KITS) IMPLANT
LOOP CUT BIPOLAR 24F LRG (ELECTROSURGICAL) ×2 IMPLANT
MANIFOLD NEPTUNE II (INSTRUMENTS) ×2 IMPLANT
NS IRRIG 1000ML POUR BTL (IV SOLUTION) ×2 IMPLANT
PACK CYSTO (CUSTOM PROCEDURE TRAY) ×2 IMPLANT
PENCIL SMOKE EVACUATOR (MISCELLANEOUS) IMPLANT
SYRINGE IRR TOOMEY STRL 70CC (SYRINGE) ×2 IMPLANT
TUBING CONNECTING 10 (TUBING) ×2 IMPLANT
TUBING UROLOGY SET (TUBING) ×2 IMPLANT

## 2019-06-10 NOTE — OR Nursing (Signed)
Pt voided pink tinged urine in bathroom, not measured, exact amount unknown.

## 2019-06-10 NOTE — Op Note (Signed)
PATIENT:  Victoria Short  PRE-OPERATIVE DIAGNOSIS: 1.  History of muscle invasive  bladder cancer. 2. Persistent gross hematuria  POST-OPERATIVE DIAGNOSIS: Same  PROCEDURE:  Procedure(s): 1. Cystoscopy with clot evacuation 2. TRANSURETHRAL RESECTION OF BLADDER TUMOR (TURBT) (3 cm.)   SURGEON:  Surgeon(s): Claybon Jabs  ANESTHESIA:   General  EBL:  Minimal  DRAINS: None  SPECIMEN:  Bladder tumor  DISPOSITION OF SPECIMEN:  PATHOLOGY  Indication:  Victoria Short is a 85 year old female with a history of muscle invasive bladder cancer.  She was treated with radiation but not sensitized chemotherapy.  She has been monitored with PET scans and surveillance cystoscopy.  She was found to have a nodule or recurrence on the right superior wall of the bladder where her previous tumor was located.  No abnormality outside of the bladder was noted on her PET scan.  She was being monitored but developed gross hematuria that has persisted.  Her hemoglobin has remained within the normal range but she is brought to the operating room due to the continued gross hematuria.  Description of operation: The patient was taken to the operating room and administered general anesthesia. They were then placed on the table and moved to the dorsal lithotomy position after which the genitalia was sterilely prepped and draped. An official timeout was then performed.  The 71 French resectoscope with the 30 lens and visual obturator were then passed into the bladder under direct visualization. Urethra appeared normal. The visual obturator was then removed and the Gyrus resectoscope element with 30  lens was then inserted and the bladder was fully and systematically inspected. Ureteral orifices were noted to be in the normal anatomic positions.   She was found to have clot within the bladder which was irrigated out.  I then found clot adherent to a bladder tumor that was sessile and smooth in appearance.  It was  located on the right wall the bladder anteriorly.  I used the Microvasive evacuator to remove all clot from the bladder and from the tumor to allow for better visualization.  I then began resecting the tumor from the were right superior wall of bladder.  It was clearly muscle invasive the and appeared to be cancer that I was cutting through even beyond what would be considered the location of the muscle result of the bladder.  I did not feel resecting the deeper would result in any further improvement in her prognosis/survival and therefore resected away as much of the tumor that appeared to be necrotic with some associated dystrophic calcification.  Once all of the tumor had been resected away down to the level of the her bladder muscle I then began fulgurating all bleeding points.  I switched from the resection loop to a button electrode which afforded me the ability to fulgurate the entire area that had been resected so that I could control all bleeding.  I stopped the inflow of fluid and slowly drained the bladder to watch for any further bleeding at low pressures and found a few areas which were fulgurated and eventually was able to for rate everything that appeared to be bleeding and could potentially bleed.  At this point the Microvasive evacuator was used to evacuate all of the resected bladder tumor and then I reinspected the bladder to make sure that this had not caused any further bleeding.  As there was no further bleeding noted the bladder was drained, the  resectoscope was removed at the patient was awakened and taken  to the recovery room in stable and satisfactory condition.  She tolerated the procedure well with no intraoperative complications.  PLAN OF CARE: Discharge to home after PACU  PATIENT DISPOSITION:  PACU - hemodynamically stable.

## 2019-06-10 NOTE — Anesthesia Procedure Notes (Signed)
Procedure Name: LMA Insertion Date/Time: 06/10/2019 1:26 PM Performed by: Lavina Hamman, CRNA Pre-anesthesia Checklist: Patient identified, Emergency Drugs available, Suction available and Patient being monitored Patient Re-evaluated:Patient Re-evaluated prior to induction Oxygen Delivery Method: Circle System Utilized Preoxygenation: Pre-oxygenation with 100% oxygen Induction Type: IV induction Ventilation: Mask ventilation without difficulty LMA: LMA inserted LMA Size: 4.0 Number of attempts: 1 Airway Equipment and Method: Bite block Placement Confirmation: positive ETCO2 Tube secured with: Tape Dental Injury: Teeth and Oropharynx as per pre-operative assessment

## 2019-06-10 NOTE — Anesthesia Preprocedure Evaluation (Signed)
Anesthesia Evaluation  Patient identified by MRN, date of birth, ID band Patient awake    Reviewed: Allergy & Precautions, NPO status , Patient's Chart, lab work & pertinent test results  Airway Mallampati: II  TM Distance: >3 FB Neck ROM: Full    Dental no notable dental hx.    Pulmonary shortness of breath, sleep apnea , pneumonia, former smoker,    Pulmonary exam normal breath sounds clear to auscultation       Cardiovascular hypertension, Pt. on medications + CAD, + Past MI and + DOE  + Valvular Problems/Murmurs  Rhythm:Regular Rate:Normal + Systolic murmurs Echo [id number] - Left ventricle: The cavity size was normal. Wall thickness was at the upper limits of normal with basal septal hypertrophy. There is systolic anterior motion of the mitral apparatus. Systolic function was vigorous. The estimated ejection fraction was in the range of 65% to 70%. Wall motion was normal; there were no regional wall motion abnormalities. Doppler parameters are consistent with abnormal left ventricular relaxation (grade 1 diastolic dysfunction). Doppler parameters are consistent with high ventricular filling pressure. - Aortic valve: Mildly calcified annulus. Trileaflet; mildly   calcified leaflets. There was mild regurgitation. - Mitral valve: Mildly thickened, mildly calcified leaflets . There  was trivial regurgitation. - Left atrium: The atrium was mildly dilated. - Right atrium: Central venous pressure (est): 3 mm Hg. - Atrial septum: There was increased thickness of the septum, consistent with lipomatous hypertrophy. - Tricuspid valve: There was trivial regurgitation. - Pulmonary arteries: PA peak pressure: 35 mm Hg (S). - Pericardium, extracardiac: There was no pericardial effusion.  Impressions: - Upper normal LV wall thickness with significant basal septal hypertrophy, LVEF 65-70%. Systolic anterior motion of the mitral apparatus noted. Grade  1 diastolic dysfunction with increased filling pressures. Mild left atrial enlargement. Mildly thickened  and calcified mitral valve with trivial mitral regurgitation.  Mildly sclerotic aortic valve with mild aortic regurgitation. Trivial tricuspid regurgitation with PASP 35 mmHg. Lipomatous  hypertrophy of the interatrial septum.   Neuro/Psych  Neuromuscular disease negative psych ROS   GI/Hepatic Neg liver ROS, hiatal hernia, PUD, GERD  Medicated,  Endo/Other  negative endocrine ROS  Renal/GU negative Renal ROS     Musculoskeletal  (+) Arthritis ,   Abdominal   Peds  Hematology  (+) Blood dyscrasia, anemia ,   Anesthesia Other Findings   Reproductive/Obstetrics negative OB ROS                            Anesthesia Physical  Anesthesia Plan  ASA: IV  Anesthesia Plan: General   Post-op Pain Management:    Induction: Intravenous  PONV Risk Score and Plan: 4 or greater and Ondansetron, Dexamethasone and Treatment may vary due to age or medical condition  Airway Management Planned: LMA and Oral ETT  Additional Equipment:   Intra-op Plan:   Post-operative Plan: Extubation in OR  Informed Consent: I have reviewed the patients History and Physical, chart, labs and discussed the procedure including the risks, benefits and alternatives for the proposed anesthesia with the patient or authorized representative who has indicated his/her understanding and acceptance.     Dental advisory given  Plan Discussed with: CRNA  Anesthesia Plan Comments:         Anesthesia Quick Evaluation

## 2019-06-10 NOTE — Transfer of Care (Signed)
Immediate Anesthesia Transfer of Care Note  Patient: Victoria Short  Procedure(s) Performed: Procedure(s): CYSTOSCOPY WITH CLOT EVACUATION (N/A) TRANSURETHRAL RESECTION OF BLADDER TUMOR (TURBT) VERSUS FULGURATION (N/A)  Patient Location: PACU  Anesthesia Type:General  Level of Consciousness:  sedated, patient cooperative and responds to stimulation  Airway & Oxygen Therapy:Patient Spontanous Breathing and Patient connected to face mask oxgen  Post-op Assessment:  Report given to PACU RN and Post -op Vital signs reviewed and stable  Post vital signs:  Reviewed and stable  Last Vitals:  Vitals:   06/10/19 1021  BP: (!) 161/75  Pulse: 66  Resp: 16  Temp: 37 C  SpO2: [id number]    Complications: No apparent anesthesia complications

## 2019-06-11 ENCOUNTER — Other Ambulatory Visit: Payer: Self-pay

## 2019-06-11 ENCOUNTER — Encounter (HOSPITAL_COMMUNITY): Payer: Self-pay | Admitting: Urology

## 2019-06-11 NOTE — Anesthesia Postprocedure Evaluation (Signed)
Anesthesia Post Note  Patient: Beryle Badal  Procedure(s) Performed: CYSTOSCOPY WITH CLOT EVACUATION (N/A ) TRANSURETHRAL RESECTION OF BLADDER TUMOR (TURBT) VERSUS FULGURATION (N/A )     Patient location during evaluation: PACU Anesthesia Type: General Level of consciousness: awake and alert Pain management: pain level controlled Vital Signs Assessment: post-procedure vital signs reviewed and stable Respiratory status: spontaneous breathing, nonlabored ventilation, respiratory function stable and patient connected to nasal cannula oxygen Cardiovascular status: blood pressure returned to baseline and stable Postop Assessment: no apparent nausea or vomiting Anesthetic complications: no    Last Vitals:  Vitals:   06/10/19 1530 06/10/19 1600  BP: (!) 173/77 (!) 175/72  Pulse: 64 67  Resp: (!) 21 (!) 22  Temp:    SpO2: 96% 93%    Last Pain:  Vitals:   06/10/19 1500  TempSrc:   PainSc: 3    Pain Goal:                   Nichael Ehly

## 2019-06-16 LAB — SURGICAL PATHOLOGY

## 2019-06-25 DIAGNOSIS — R8279 Other abnormal findings on microbiological examination of urine: Secondary | ICD-10-CM | POA: Diagnosis not present

## 2019-08-02 DIAGNOSIS — E7849 Other hyperlipidemia: Secondary | ICD-10-CM | POA: Diagnosis not present

## 2019-08-02 DIAGNOSIS — I10 Essential (primary) hypertension: Secondary | ICD-10-CM | POA: Diagnosis not present

## 2019-08-13 DIAGNOSIS — Z681 Body mass index (BMI) 19 or less, adult: Secondary | ICD-10-CM | POA: Diagnosis not present

## 2019-08-13 DIAGNOSIS — N3281 Overactive bladder: Secondary | ICD-10-CM | POA: Diagnosis not present

## 2019-08-13 DIAGNOSIS — I700 Atherosclerosis of aorta: Secondary | ICD-10-CM | POA: Diagnosis not present

## 2019-08-13 DIAGNOSIS — E1140 Type 2 diabetes mellitus with diabetic neuropathy, unspecified: Secondary | ICD-10-CM | POA: Diagnosis not present

## 2019-08-13 DIAGNOSIS — C679 Malignant neoplasm of bladder, unspecified: Secondary | ICD-10-CM | POA: Diagnosis not present

## 2019-08-13 DIAGNOSIS — J479 Bronchiectasis, uncomplicated: Secondary | ICD-10-CM | POA: Diagnosis not present

## 2019-08-13 DIAGNOSIS — J449 Chronic obstructive pulmonary disease, unspecified: Secondary | ICD-10-CM | POA: Diagnosis not present

## 2019-08-13 DIAGNOSIS — I10 Essential (primary) hypertension: Secondary | ICD-10-CM | POA: Diagnosis not present

## 2019-08-30 DIAGNOSIS — I10 Essential (primary) hypertension: Secondary | ICD-10-CM | POA: Diagnosis not present

## 2019-08-30 DIAGNOSIS — E7849 Other hyperlipidemia: Secondary | ICD-10-CM | POA: Diagnosis not present

## 2019-08-30 DIAGNOSIS — Z23 Encounter for immunization: Secondary | ICD-10-CM | POA: Diagnosis not present

## 2019-09-17 DIAGNOSIS — C678 Malignant neoplasm of overlapping sites of bladder: Secondary | ICD-10-CM | POA: Diagnosis not present

## 2019-09-17 DIAGNOSIS — R351 Nocturia: Secondary | ICD-10-CM | POA: Diagnosis not present

## 2019-09-27 DIAGNOSIS — Z23 Encounter for immunization: Secondary | ICD-10-CM | POA: Diagnosis not present

## 2019-10-01 DIAGNOSIS — C678 Malignant neoplasm of overlapping sites of bladder: Secondary | ICD-10-CM | POA: Diagnosis not present

## 2019-10-01 DIAGNOSIS — R310 Gross hematuria: Secondary | ICD-10-CM | POA: Diagnosis not present

## 2019-10-01 DIAGNOSIS — R8279 Other abnormal findings on microbiological examination of urine: Secondary | ICD-10-CM | POA: Diagnosis not present

## 2019-10-09 DIAGNOSIS — C679 Malignant neoplasm of bladder, unspecified: Secondary | ICD-10-CM | POA: Diagnosis not present

## 2019-10-09 DIAGNOSIS — Z9221 Personal history of antineoplastic chemotherapy: Secondary | ICD-10-CM | POA: Diagnosis not present

## 2019-10-09 DIAGNOSIS — Z923 Personal history of irradiation: Secondary | ICD-10-CM | POA: Diagnosis not present

## 2019-10-09 DIAGNOSIS — D494 Neoplasm of unspecified behavior of bladder: Secondary | ICD-10-CM | POA: Diagnosis not present

## 2019-10-11 ENCOUNTER — Ambulatory Visit (INDEPENDENT_AMBULATORY_CARE_PROVIDER_SITE_OTHER): Payer: Medicare Other | Admitting: Cardiovascular Disease

## 2019-10-11 ENCOUNTER — Encounter: Payer: Self-pay | Admitting: Cardiovascular Disease

## 2019-10-11 ENCOUNTER — Other Ambulatory Visit: Payer: Self-pay

## 2019-10-11 VITALS — BP 130/72 | HR 72 | Ht 66.0 in | Wt 115.0 lb

## 2019-10-11 DIAGNOSIS — Z955 Presence of coronary angioplasty implant and graft: Secondary | ICD-10-CM

## 2019-10-11 DIAGNOSIS — R001 Bradycardia, unspecified: Secondary | ICD-10-CM

## 2019-10-11 DIAGNOSIS — I25118 Atherosclerotic heart disease of native coronary artery with other forms of angina pectoris: Secondary | ICD-10-CM

## 2019-10-11 DIAGNOSIS — I10 Essential (primary) hypertension: Secondary | ICD-10-CM | POA: Diagnosis not present

## 2019-10-11 DIAGNOSIS — I6523 Occlusion and stenosis of bilateral carotid arteries: Secondary | ICD-10-CM | POA: Diagnosis not present

## 2019-10-11 DIAGNOSIS — E785 Hyperlipidemia, unspecified: Secondary | ICD-10-CM | POA: Diagnosis not present

## 2019-10-11 NOTE — Progress Notes (Signed)
SUBJECTIVE: The patient presents for routine follow-up of coronary artery disease.  She had a drug-eluting stent placed to the LAD in 2012 with a patent stent by repeat cardiac catheterization in July 2016.  She underwent a low risk nuclear stress test in October 2019.  She had been having issues with hematuria and is currently off of aspirin.  She also has a history of muscle invasive bladder cancer and underwent cystoscopy with clot evacuation on 06/10/2019.  She only uses oxygen at night and rarely has to use it during the day.  She denies chest pain, palpitations, and significant shortness of breath.  She is here with her husband whom I saw in the office earlier this morning.    Review of Systems: As per "subjective", otherwise negative.  Allergies  Allergen Reactions  . Brilinta [Ticagrelor] Shortness Of Breath, Nausea And Vomiting and Other (See Comments)    dyspnea  . Iodine Nausea And Vomiting and Other (See Comments)    AND DIZZY  . Metoprolol Other (See Comments)    Bradycardia  . Shellfish Allergy Nausea And Vomiting and Other (See Comments)    Unsteady gait/  PER PT ONLY SHRIMP  . Streptomycin Nausea And Vomiting  . Iodinated Diagnostic Agents Nausea Only    Current Outpatient Medications  Medication Sig Dispense Refill  . amLODipine (NORVASC) 5 MG tablet Take 1 tablet (5 mg total) by mouth daily. (Patient taking differently: Take 5 mg by mouth every morning. ) 90 tablet 1  . Ascorbic Acid (VITAMIN C WITH ROSE HIPS) 500 MG tablet Take 500 mg by mouth 2 (two) times daily.     Marland Kitchen aspirin EC 81 MG tablet Take 81 mg by mouth daily.    Marland Kitchen atorvastatin (LIPITOR) 10 MG tablet Take 10 mg by mouth daily.   3  . Calcium Carb-Cholecalciferol (CALCIUM 600+D3 PO) Take 1 tablet by mouth 2 (two) times daily.    . Cholecalciferol (VITAMIN D3) 50 MCG (2000 UT) TABS Take 2,000 Units by mouth 2 (two) times daily.    . Ferrous Gluconate (IRON 27 PO) Take 27 mg by mouth 2 (two)  times daily.    . isosorbide mononitrate (IMDUR) 30 MG 24 hr tablet Take 30 mg by mouth daily.   3  . Meclizine HCl (TRAVEL SICKNESS) 25 MG CHEW Chew 50 mg by mouth daily as needed (dizziness).    Marland Kitchen OVER THE COUNTER MEDICATION Take 1 tablet by mouth 2 (two) times daily. Cognium    . phenazopyridine (PYRIDIUM) 200 MG tablet Take 1 tablet (200 mg total) by mouth 3 (three) times daily as needed for pain. 20 tablet 0   No current facility-administered medications for this visit.    Past Medical History:  Diagnosis Date  . Arthritis    hands  . Benign neoplasm of pituitary gland (HCC)    per last mri 01-30-2015  slight decrease macro adenoma  . Bladder tumor   . Borderline diabetes mellitus   . Cardiogenic shock ([place])    a. 05/2011 requiring pressor therapy.  . Carotid artery disease ([place])    per last duplex in epic 05-06-2015   bilateral ICA 1-39%  . Constipation   . Coronary artery disease cardiologist-  dr Bronson Ing   a. NSTEMI 05/2011 s/p DES to LAD, with ?Takotsubo distribution of cardiomyopathy at that time with cardiogenic shock. b. NSTEMI 06/2011 ?spasm - stable CAD, normal EF. c. Patent LAD stent, nonobst CAD by cath 08/20/12 with normal EF. d.  Hx dyspnea with Brilinta. e. patent stent by cath in 01/2015  . Dependence on nocturnal oxygen therapy 2020  . DOE (dyspnea on exertion)   . Dyspnea    with exertion and also noted some at rest  . Erosive gastritis   . Fatigue   . GERD (gastroesophageal reflux disease)   . Heart murmur   . Hematuria    passes blood clots occ  . Hemorrhoids   . Hiatal hernia   . History of gastritis 05/2014   w/ erosions  . History of GI bleed 05/2015   s/p  EGD with ablation duodenal polyp and gastric polypectomy with post gi bleed , s/p clipping  . History of non-ST elevation myocardial infarction (NSTEMI) 05/11/2011   w/ cardiogenic shock requiring pressor therapy,  and s/p  PCI and DES to LAD  . History of skin cancer   . HOH (hard of hearing)     bilateral wears hearing aides  . Hyperlipidemia   . Hypertension   . Hypertrophic cardiomyopathy ([place])    a. echo 08/2012: focal basal septal hypertrophy, mildly elevated gradient 57mmHg across LVOT.  . Iron deficiency anemia due to chronic blood loss    hemotologist --- dr Lauree Chandler @ [facility] office in [place],, hx IV iron infusions  . Melanoma ([place])    nose  . Mild obstructive sleep apnea    per pt no cpap recommendation  . Myocardial infarction ([place])   . Nasal polyp   . Neuropathy, peripheral   . Nocturia more than twice per night   . OAB (overactive bladder)   . Osteoporosis   . Pneumonia    childhood  . Polyp of stomach and duodenum   . Poor circulation    lower ext bil  . Pre-diabetes   . S/P drug eluting coronary stent placement 05/12/2011   to mid LAD  . Scoliosis   . Stress-induced cardiomyopathy 05/2011   a. ?Takotubso cardiomyopathy - distribution was different than that of LAD stenosis.  Marland Kitchen Unsteady gait   . Urinary incontinence   . Vertigo, intermittent   . Vitamin D deficiency   . Wears hearing aid in both ears     Past Surgical History:  Procedure Laterality Date  . APPENDECTOMY  1953  . CARDIAC CATHETERIZATION  06/22/2011   patent LAD stent. Normal EF  . CARDIAC CATHETERIZATION  08/2012   Patent LAD stent with normal ejection fraction.  Marland Kitchen CARDIAC CATHETERIZATION N/A 01/19/2015   Procedure: Right/Left Heart Cath and Coronary Angiography;  Surgeon: Jettie Booze, MD;  Location: [facility];  Service: Cardiovascular;  Laterality: N/A;  . CARDIOVASCULAR STRESS TEST  11-26-2014   dr Bronson Ing   Low risk nuclear study w/ no ischemia or infarct/  normal LV function and wall motion , ef 72%  . CATARACT EXTRACTION W/ INTRAOCULAR LENS  IMPLANT, BILATERAL  2009  approx.  . COLONOSCOPY    . CORONARY ANGIOPLASTY WITH STENT PLACEMENT  05-12-2011  dr Shelva Majestic   mid LAD: 2.5 X 16 mm Promus DES for 85-90% stenosis/  pCFx 30-40%, diagonal 20%, ef  40-45%/ ascending aorta stenosis 50mm gradient/  hypocontratility involve distal anterolateral apical and distal inferior wall  . CYSTOSCOPY N/A 06/10/2019   Procedure: CYSTOSCOPY WITH CLOT EVACUATION;  Surgeon: Kathie Rhodes, MD;  Location: [facility];  Service: Urology;  Laterality: N/A;  . ESOPHAGOGASTRODUODENOSCOPY N/A 05/23/2014   Procedure: ESOPHAGOGASTRODUODENOSCOPY (EGD);  Surgeon: Rogene Houston, MD;  [facility];  Service: Endoscopy;  Laterality: N/A;  230 - moved to 1:05 - Ann notified pt  . ESOPHAGOGASTRODUODENOSCOPY N/A 05/20/2015   Procedure: ESOPHAGOGASTRODUODENOSCOPY (EGD);  Surgeon: Rogene Houston, MD;  [facility];  Service: Endoscopy;  Laterality: N/A;  1:00  . ESOPHAGOGASTRODUODENOSCOPY N/A 05/28/2015   Procedure: ESOPHAGOGASTRODUODENOSCOPY (EGD);  Surgeon: Rogene Houston, MD;  [facility];  Service: Endoscopy;  Laterality: N/A;  . GIVENS CAPSULE STUDY N/A 05/12/2014   Procedure: GIVENS CAPSULE STUDY;  Surgeon: Rogene Houston, MD;  [facility];  Service: Endoscopy;  Laterality: N/A;  730  . LEFT HEART CATHETERIZATION WITH CORONARY ANGIOGRAM N/A 05/12/2011   Procedure: LEFT HEART CATHETERIZATION WITH CORONARY ANGIOGRAM;  Surgeon: Troy Sine, MD;  Location: [facility] CATH LAB;  Service: Cardiovascular;  Laterality: N/A;  . LEFT HEART CATHETERIZATION WITH CORONARY ANGIOGRAM N/A 06/22/2011   Procedure: LEFT HEART CATHETERIZATION WITH CORONARY ANGIOGRAM;  Surgeon: Wellington Hampshire, MD;  Location: [facility];  Service: Cardiovascular;  Laterality: N/A;  . LEFT HEART CATHETERIZATION WITH CORONARY ANGIOGRAM N/A 08/20/2012   Procedure: LEFT HEART CATHETERIZATION WITH CORONARY ANGIOGRAM;  Surgeon: Burnell Blanks, MD;  Location: [facility] CATH LAB;  Service: Cardiovascular;  Laterality: N/A;  . PARTIAL OOPHORECTOMY Right 1955   BENIGN  . PERCUTANEOUS CORONARY STENT INTERVENTION (PCI-S)  05/12/2011   Procedure: PERCUTANEOUS CORONARY STENT  INTERVENTION (PCI-S);  Surgeon: Troy Sine, MD;  Location: [facility] CATH LAB;  Service: Cardiovascular;;  . POLYPECTOMY N/A 05/20/2015   Procedure: EGD with polypectomy;  Surgeon: Rogene Houston, MD;  [facility];  Service: Endoscopy;  Laterality: N/A;  . TRANSTHORACIC ECHOCARDIOGRAM  10-09-2017   dr Bronson Ing   ef 55-60%, moderate conentric LVH, grade 2 diastolic dysfunction/  basal septal hypertrophy with systolic anterior motion of the mitral valve outflow track peak grandient greater than 1107mmHg with valsalva/  mild AR, MR, and TR  . TRANSURETHRAL RESECTION OF BLADDER TUMOR N/A 04/30/2018   Procedure: TRANSURETHRAL RESECTION OF BLADDER TUMOR (TURBT);  Surgeon: Kathie Rhodes, MD;  [facility]: [facility];  Service: Urology;  Laterality: N/A;  . TRANSURETHRAL RESECTION OF BLADDER TUMOR N/A 06/10/2019   Procedure: TRANSURETHRAL RESECTION OF BLADDER TUMOR (TURBT) VERSUS FULGURATION;  Surgeon: Kathie Rhodes, MD;  Location: [facility];  Service: Urology;  Laterality: N/A;  . WISDOM TOOTH EXTRACTION      Social History   Socioeconomic History  . Marital status: Married    Spouse name: Not on file  . Number of children: Not on file  . Years of education: Not on file  . Highest education level: Not on file  Occupational History  . Not on file  Tobacco Use  . Smoking status: Former Smoker    Packs/day: 1.00    Years: 12.00    Pack years: 12.00    Types: Cigarettes    Quit date: 05/11/1979    Years since quitting: 40.4  . Smokeless tobacco: Never Used  Substance and Sexual Activity  . Alcohol use: No  . Drug use: No  . Sexual activity: Yes    Birth control/protection: Post-menopausal  Other Topics Concern  . Not on file  Social History Narrative  . Not on file   Social Determinants of Health   Financial Resource Strain:   . Difficulty of Paying Living Expenses:   Food Insecurity:   . Worried About Charity fundraiser in the Last Year:   . Arboriculturist in the Last Year:     Transportation Needs:   .  Lack of Transportation (Medical):   Marland Kitchen Lack of Transportation (Non-Medical):   Physical Activity:   . Days of Exercise per Week:   . Minutes of Exercise per Session:   Stress:   . Feeling of Stress :   Social Connections:   . Frequency of Communication with Friends and Family:   . Frequency of Social Gatherings with Friends and Family:   . Attends Religious Services:   . Active Member of Clubs or Organizations:   . Attends Archivist Meetings:   Marland Kitchen Marital Status:   Intimate Partner Violence:   . Fear of Current or Ex-Partner:   . Emotionally Abused:   Marland Kitchen Physically Abused:   . Sexually Abused:    Orson Slick, LPN was present throughout the entirety of the encounter.  Vitals:   10/11/19 1256  BP: 130/72  Pulse: 72  SpO2: 90%  Weight: 115 lb (52.2 kg)  Height: 5\' 6"  (1.676 m)    Wt Readings from Last 3 Encounters:  10/11/19 115 lb (52.2 kg)  06/10/19 112 lb 4 oz (50.9 kg)  02/21/19 115 lb (52.2 kg)     PHYSICAL EXAM General: NAD, frail-appearing. HEENT: Hard of hearing. Neck: No JVD, no thyromegaly. Lungs: Clear to auscultation bilaterally with normal respiratory effort. CV: Regular rate and rhythm, normal S1/S2, no S3/S4, no murmur. No pretibial or periankle edema.    Abdomen: Soft, nontender, no distention.  Neurologic: Alert and oriented.  Psych: Normal affect. Skin: Normal. Musculoskeletal: No gross deformities.    ECG: Reviewed above under Subjective   Labs: Lab Results  Component Value Date/Time   K 4.4 06/10/2019 10:22 AM   BUN 10 06/10/2019 10:22 AM   CREATININE 0.64 06/10/2019 10:22 AM   ALT 14 04/30/2018 01:25 PM   HGB 12.2 06/10/2019 10:22 AM     Lipids: No results found for: LDLCALC, LDLDIRECT, CHOL, TRIG, HDL     ASSESSMENT AND PLAN:  1.  Coronary artery disease: Status post drug-eluting stent placement to the LAD in 2012 with patent stent by repeat cardiac catheterization July 2016.  She underwent  a low risk nuclear stress test in October 2019.  Symptomatically stable.  Continue aspirin (temporarily on hold for rectal bleeding), statin, and Imdur.   2.  Bradycardia: Resolved with discontinuation of metoprolol.  Symptoms of dyspnea have resolved.  Continue to avoid AV nodal blocking agents altogether.  3.  Hypertension: BP is normal.  No changes to therapy.  4.  Hyperlipidemia: She remains on atorvastatin 10 mg daily.  5.  Carotid artery stenosis: Minimal stenosis by Doppler studies in November 2016.  No plans to repeat imaging given minimal stenosis and advanced age.    Disposition: Follow up 6 months    [name], M.D., F.A.C.C.

## 2019-10-11 NOTE — Patient Instructions (Signed)
Medication Instructions:  Continue all current medications.   Labwork: none  Testing/Procedures: none  Follow-Up: 6 months   Any Other Special Instructions Will Be Listed Below (If Applicable).   If you need a refill on your cardiac medications before your next appointment, please call your pharmacy.  

## 2019-10-29 DIAGNOSIS — C678 Malignant neoplasm of overlapping sites of bladder: Secondary | ICD-10-CM | POA: Diagnosis not present

## 2019-10-29 DIAGNOSIS — R310 Gross hematuria: Secondary | ICD-10-CM | POA: Diagnosis not present

## 2019-10-29 DIAGNOSIS — N311 Reflex neuropathic bladder, not elsewhere classified: Secondary | ICD-10-CM | POA: Diagnosis not present

## 2019-10-31 ENCOUNTER — Other Ambulatory Visit: Payer: Self-pay | Admitting: Urology

## 2019-10-31 DIAGNOSIS — R1031 Right lower quadrant pain: Secondary | ICD-10-CM | POA: Diagnosis not present

## 2019-10-31 DIAGNOSIS — R310 Gross hematuria: Secondary | ICD-10-CM | POA: Diagnosis not present

## 2019-10-31 DIAGNOSIS — C678 Malignant neoplasm of overlapping sites of bladder: Secondary | ICD-10-CM | POA: Diagnosis not present

## 2019-11-01 DIAGNOSIS — I10 Essential (primary) hypertension: Secondary | ICD-10-CM | POA: Diagnosis not present

## 2019-11-01 DIAGNOSIS — E782 Mixed hyperlipidemia: Secondary | ICD-10-CM | POA: Diagnosis not present

## 2019-11-01 DIAGNOSIS — R944 Abnormal results of kidney function studies: Secondary | ICD-10-CM | POA: Diagnosis not present

## 2019-11-01 DIAGNOSIS — E1140 Type 2 diabetes mellitus with diabetic neuropathy, unspecified: Secondary | ICD-10-CM | POA: Diagnosis not present

## 2019-11-01 NOTE — Progress Notes (Signed)
Reviewing pt chart for surgical pre-op.  Noted had been done at [place].  Reviewed anesthesia record from previous surgery 06-10-2019 and she a ASA IV.  Per anesthesia guidelines pt is not a candidate at ambulatory surgery center when pt is a ASA IV due to multiple medical comorbidities .  Reviewed chart with anesthesia, Konrad Felix PA,  Stated pt will need to be moved to main OR.  Called and left message with Coni, OR scheduler for Dr Karsten Ro, informed pt needs to be done at main OR and why.

## 2019-11-04 ENCOUNTER — Encounter (HOSPITAL_COMMUNITY): Payer: Self-pay

## 2019-11-04 NOTE — Patient Instructions (Addendum)
DUE TO COVID-19 ONLY ONE VISITOR ARE ALLOWED TO COME WITH YOU AND STAY IN THE WAITING ROOM ONLY DURING PRE OP AND PROCEDURE. THEN TWO VISITORS MAY VISIT WITH YOU IN YOUR PRIVATE ROOM DURING VISITING HOURS ONLY!!   COVID SWAB TESTING MUST BE COMPLETED ON:  Thursday, Nov 07, 2019  At 12:30PM  [address] [facility] (Must self quarantine after testing. Follow instructions on handout.)             Your procedure is scheduled on: Monday, Nov 11, 2019   Report to [facility] Main  Entrance    Report to admitting at 9:00 AM   Call this number if you have problems the morning of surgery [phone]   Do not eat food or drink liquids :After Midnight.   Oral Hygiene is also important to reduce your risk of infection.                                    Remember - BRUSH YOUR TEETH THE MORNING OF SURGERY WITH YOUR REGULAR TOOTHPASTE   Do NOT smoke after Midnight   Take these medicines the morning of surgery with A SIP OF WATER: Amlodipine, Atorvastatin, Isosorbide                               You may not have any metal on your body including hair pins, jewelry, and body piercings             Do not wear make-up, lotions, powders, perfumes/cologne, or deodorant             Do not wear nail polish.  Do not shave  48 hours prior to surgery.                Do not bring valuables to the hospital. [place].   Contacts, dentures or bridgework may not be worn into surgery.   Bring small overnight bag day of surgery.    Patients discharged the day of surgery will not be allowed to drive home.   Special Instructions: Bring a copy of your healthcare power of attorney and living will documents         the day of surgery if you haven't scanned them in before.              Please read over the following fact sheets you were given: IF YOU HAVE QUESTIONS ABOUT YOUR PRE OP  INSTRUCTIONS PLEASE CALL [phone]   [facility] [place] - Preparing for Surgery Before surgery, you can play an important role.  Because skin is not sterile, your skin needs to be as free of germs as possible.  You can reduce the number of germs on your skin by washing with CHG (chlorahexidine gluconate) soap before surgery.  CHG is an antiseptic cleaner which kills germs and bonds with the skin to continue killing germs even after washing. Please DO NOT use if you have an allergy to CHG or antibacterial soaps.  If your skin becomes reddened/irritated stop using the CHG and inform your nurse when you arrive at Short Stay. Do not shave (including legs and underarms) for at least 48 hours prior to the first  CHG shower.  You may shave your face/neck.  Please follow these instructions carefully:  1.  Shower with CHG Soap the night before surgery and the  morning of surgery.  2.  If you choose to wash your hair, wash your hair first as usual with your normal  shampoo.  3.  After you shampoo, rinse your hair and body thoroughly to remove the shampoo.                             4.  Use CHG as you would any other liquid soap.  You can apply chg directly to the skin and wash.  Gently with a scrungie or clean washcloth.  5.  Apply the CHG Soap to your body ONLY FROM THE NECK DOWN.   Do   not use on face/ open                           Wound or open sores. Avoid contact with eyes, ears mouth and   genitals (private parts).                       Wash face,  Genitals (private parts) with your normal soap.             6.  Wash thoroughly, paying special attention to the area where your    surgery  will be performed.  7.  Thoroughly rinse your body with warm water from the neck down.  8.  DO NOT shower/wash with your normal soap after using and rinsing off the CHG Soap.                9.  Pat yourself dry with a clean towel.            10.  Wear clean pajamas.            11.  Place clean sheets on your bed the  night of your first shower and do not  sleep with pets. Day of Surgery : Do not apply any lotions/deodorants the morning of surgery.  Please wear clean clothes to the hospital/surgery center.  FAILURE TO FOLLOW THESE INSTRUCTIONS MAY RESULT IN THE CANCELLATION OF YOUR SURGERY  PATIENT SIGNATURE_________________________________  NURSE SIGNATURE__________________________________  ________________________________________________________________________

## 2019-11-05 ENCOUNTER — Encounter (HOSPITAL_COMMUNITY)
Admission: RE | Admit: 2019-11-05 | Discharge: 2019-11-05 | Disposition: A | Discharge status: Home / Self Care | Payer: Medicare Other | Source: Ambulatory Visit | Attending: Urology | Admitting: Urology

## 2019-11-05 ENCOUNTER — Encounter (HOSPITAL_COMMUNITY): Payer: Self-pay

## 2019-11-05 ENCOUNTER — Other Ambulatory Visit: Payer: Self-pay

## 2019-11-05 HISTORY — DX: Other complications of anesthesia, initial encounter: T88.59XA

## 2019-11-05 HISTORY — DX: Malignant neoplasm of bladder, unspecified: C67.9

## 2019-11-05 HISTORY — DX: Nevus, non-neoplastic: I78.1

## 2019-11-05 HISTORY — DX: Chronic obstructive pulmonary disease, unspecified: J44.9

## 2019-11-05 HISTORY — DX: Personal history of other infectious and parasitic diseases: Z86.19

## 2019-11-05 NOTE — Progress Notes (Signed)
PCP -  DR. Lizbeth Bark Cardiologist - Dr. Court Joy last office visit 10/11/19  Chest x-ray - greater than 1 year EKG - 06/09/2021 in epic Stress Test - greater than 2 years ECHO - greater than 2 years Cardiac Cath - greater than 2 years  Sleep Study - greater than 2 years CPAP - not ordered  Fasting Blood Sugar - N/A Checks Blood Sugar __ N/A___ times a day  Blood Thinner Instructions:   N/A Aspirin Instructions: yes Last Dose: stopped about a month ago  Anesthesia review: NSTEMI, Cardiomyopathy, oxygen 3L [place] at night, CAD, Carotid artery disease  Patient denies shortness of breath, fever, cough and chest pain at PAT appointment   Patient verbalized understanding of instructions that were given to them at the PAT appointment. Patient was also instructed that they will need to review over the PAT instructions again at home before surgery.

## 2019-11-07 ENCOUNTER — Telehealth: Payer: Self-pay | Admitting: Oncology

## 2019-11-07 ENCOUNTER — Encounter (HOSPITAL_COMMUNITY)
Admission: RE | Admit: 2019-11-07 | Discharge: 2019-11-07 | Disposition: A | Discharge status: Home / Self Care | Payer: Medicare Other | Source: Ambulatory Visit | Attending: Urology | Admitting: Urology

## 2019-11-07 ENCOUNTER — Other Ambulatory Visit (HOSPITAL_COMMUNITY)
Admission: RE | Admit: 2019-11-07 | Discharge: 2019-11-07 | Disposition: A | Discharge status: Home / Self Care | Payer: Medicare Other | Source: Ambulatory Visit | Attending: Urology | Admitting: Urology

## 2019-11-07 ENCOUNTER — Other Ambulatory Visit: Payer: Self-pay

## 2019-11-07 DIAGNOSIS — M419 Scoliosis, unspecified: Secondary | ICD-10-CM | POA: Diagnosis not present

## 2019-11-07 DIAGNOSIS — Z79899 Other long term (current) drug therapy: Secondary | ICD-10-CM | POA: Diagnosis not present

## 2019-11-07 DIAGNOSIS — Z7901 Long term (current) use of anticoagulants: Secondary | ICD-10-CM | POA: Insufficient documentation

## 2019-11-07 DIAGNOSIS — Z01812 Encounter for preprocedural laboratory examination: Secondary | ICD-10-CM | POA: Diagnosis not present

## 2019-11-07 DIAGNOSIS — R310 Gross hematuria: Secondary | ICD-10-CM | POA: Diagnosis not present

## 2019-11-07 DIAGNOSIS — Z7982 Long term (current) use of aspirin: Secondary | ICD-10-CM | POA: Diagnosis not present

## 2019-11-07 DIAGNOSIS — Z20822 Contact with and (suspected) exposure to covid-19: Secondary | ICD-10-CM | POA: Insufficient documentation

## 2019-11-07 DIAGNOSIS — C679 Malignant neoplasm of bladder, unspecified: Secondary | ICD-10-CM | POA: Insufficient documentation

## 2019-11-07 DIAGNOSIS — Z87891 Personal history of nicotine dependence: Secondary | ICD-10-CM | POA: Insufficient documentation

## 2019-11-07 DIAGNOSIS — J449 Chronic obstructive pulmonary disease, unspecified: Secondary | ICD-10-CM | POA: Insufficient documentation

## 2019-11-07 DIAGNOSIS — R7303 Prediabetes: Secondary | ICD-10-CM | POA: Insufficient documentation

## 2019-11-07 DIAGNOSIS — G4733 Obstructive sleep apnea (adult) (pediatric): Secondary | ICD-10-CM | POA: Insufficient documentation

## 2019-11-07 DIAGNOSIS — I252 Old myocardial infarction: Secondary | ICD-10-CM | POA: Diagnosis not present

## 2019-11-07 DIAGNOSIS — I2510 Atherosclerotic heart disease of native coronary artery without angina pectoris: Secondary | ICD-10-CM | POA: Diagnosis not present

## 2019-11-07 DIAGNOSIS — E785 Hyperlipidemia, unspecified: Secondary | ICD-10-CM | POA: Insufficient documentation

## 2019-11-07 DIAGNOSIS — M810 Age-related osteoporosis without current pathological fracture: Secondary | ICD-10-CM | POA: Insufficient documentation

## 2019-11-07 DIAGNOSIS — I10 Essential (primary) hypertension: Secondary | ICD-10-CM | POA: Insufficient documentation

## 2019-11-07 LAB — SARS CORONAVIRUS 2 (TAT 6-24 HRS): SARS Coronavirus 2: NEGATIVE

## 2019-11-07 LAB — BASIC METABOLIC PANEL
Anion gap: 9 (ref 5–15)
BUN: 15 mg/dL (ref 8–23)
CO2: 31 mmol/L (ref 22–32)
Calcium: 9.4 mg/dL (ref 8.9–10.3)
Chloride: 104 mmol/L (ref 98–111)
Creatinine, Ser: 0.74 mg/dL (ref 0.44–1.00)
GFR calc Af Amer: >60 mL/min (ref >60)
GFR calc non Af Amer: >60 mL/min (ref >60)
Glucose, Bld: 130 mg/dL — ABNORMAL HIGH (ref 70–99)
Potassium: 3.8 mmol/L (ref 3.5–5.1)
Sodium: 144 mmol/L (ref 135–145)

## 2019-11-07 LAB — HEMOGLOBIN A1C
Hgb A1c MFr Bld: 5.9 % — ABNORMAL HIGH (ref 4.8–5.6)
Mean Plasma Glucose: 122.63 mg/dL

## 2019-11-07 LAB — CBC
HCT: 40.2 % (ref 36.0–46.0)
Hemoglobin: 12.5 g/dL (ref 12.0–15.0)
MCH: 34 pg (ref 26.0–34.0)
MCHC: 31.1 g/dL (ref 30.0–36.0)
MCV: 109.2 fL — ABNORMAL HIGH (ref 80.0–100.0)
Platelets: 238 10*3/uL (ref 150–400)
RBC: 3.68 MIL/uL — ABNORMAL LOW (ref 3.87–5.11)
RDW: 13.6 % (ref 11.5–15.5)
WBC: 6.4 10*3/uL (ref 4.0–10.5)
nRBC: 0 % (ref 0.0–0.2)

## 2019-11-07 NOTE — H&P (Signed)
CC: I have bladder cancer.  HPI: Victoria Short is a 86 year-old female with bladder cancer.  Bladder cancer: She is grossly hematuria intermittently over 4 months and underwent a CT scan which revealed no abnormality of the upper tract and no adenopathy.  TURBT 04/30/18: I resected 2 primarily solid tumors from the right wall and superior/dome region.  Pathology: Infiltrating high-grade urothelial carcinoma with involvement of the muscularis. There was also lymphovascular invasion.  Stage: T2  Treatment: Radiation without chemotherapy completed in 2/20.  PET scan 09/19/18 - negative  TURBT 06/10/19 (for persistent bleeding).  Pathology: High-grade, muscle invasive TCCa   06/25/19: She returns today having undergone transurethral resection of a bleeding bladder tumor that turned out to be high-grade and muscle invasive. She did not want chemotherapy and therefore received radiation only but it appears she has had a recurrence have. She is quite pleased with the results of her surgery as her urine has now cleared. She still has irritative symptoms that consist of urgency urge incontinence requiring protective undergarment.   09/17/19: The patient came in today with her husband. She has been doing pretty well since I have seen her last. I had to resect and fulgurate a recurrence in her bladder but she said she did really well after that and has had no blood until about 5 days ago. She had a single episode of gross hematuria and that has since cleared. She also reports occasional pain about midway between her right hip and navel. It is not constant and she said it is not bad enough that she even needs any form of pain medication, even Tylenol, for the pain. She has developed urinary symptoms that do not occur every day but she said last night was pretty bad and she had to get up about every 30-60 minutes. She also has some urgency.   10/01/19: Patient with above-noted history. She presents today with  complaints of progressive hematuria. She states that she is now having episodes of hematuria on a daily basis. She denies passage of clot material or difficulties voiding. She continues to complain of bothersome urinary frequency and urgency. She was given samples of Myrbetriq at prior office visit, which have been beneficial. She feels she is emptying her bladder well. No complaints of dysuria. She denies fevers or chills. She continues to have from her hip to her navel area, but this is not requiring any pain medicine currently. She denies any recent significant weight loss. She does use low dose aspirin daily, but is not on blood thinners. She does have past hx of cardiac stent placed in 2012.   10/29/19: Patient with above noted hx. She presents today for follow up. Given increased gross hematuria at her prior OV, we discussed that she d/c Aspirin. Hematuria improved with d/c of ASA. However, she presents today with progression of symptoms. She now complains of gross hematuria daily. She denies clots or difficulties voiding, but does complain of bothersome lower urinary tract symptoms including significant frequency, urgency, and nocturia. She was up multiple times last night to void. She was using 50 mg Myrbetriq, which was helpful but states that she ran out a samples. She continues to have abdominal pain in her right lower quadrant, which is increasingly bothersome. She also complains of vaginal pain and pain with sitting today. She has been using Tylenol and warm compress for this, which are somewhat beneficial. Prescription for tramadol was sent to her pharmacy. She tried this for pain, but had problems  tolerating it due to side effects. No complaints of fever or chills. No dizziness, lightheadedness, or increased SOB from baseline.   Her problem was discovered 04/30/2018. Her bladder cancer was diagnosed by Aidah Forquer. The bladder cancer was found because of blood in her urine.   Her bladder cancer was  treated by removal with scope and radiation. Patient denies removal of the entire bladder and chemotherapy.   Her last cysto was 06/10/2019.   She does not have a history of bleeding diathesis. Her bowels are moving normally. She is not having new bone pain. She has not recently had unwanted weight loss.     ALLERGIES: shell fish    MEDICATIONS: Aspirin 81 mg tablet,chewable  Myrbetriq 50 mg tablet, extended release 24 hr 1 tablet PO Daily  Atorvastatin Calcium 10 mg tablet  Calcium  Focus Factor  Iron 236 mg (27 mg iron) tablet  Isosorbide Mononitrate Er 30 mg tablet, extended release 24 hr  Oxygen  Stool Softener  Tramadol Hcl 50 mg tablet 1 tablet PO Q 6 H PRN  Vitamin C  Vitamin D3     GU PSH: Cystoscopy - 05/16/2019, 11/08/2018, 03/27/2018 Cystoscopy Irrigate Clot - 06/10/2019 Cystoscopy TURBT 2-5 cm - 06/10/2019, 04/30/2018 Locm 300-399Mg /Ml Iodine,1Ml - 05/20/2019     NON-GU PSH: None   GU PMH: Nocturia, She has developed nocturia and urgency. It is almost certainly secondary to her bladder cancer but I am going to see if I can mitigate some of her symptoms with Myrbetriq 50 mg. I gave Her samples of that today. - 09/17/2019 Bladder Cancer Lateral - 06/25/2019 Bladder Cancer overlapping sites, She is interested in learning about chemo radiation so I will schedule her for an appointment with Dr. Tammi Klippel to discuss this further. - 05/08/2018 Detrusor overactivity, She describes what sounds like moderate bladder overactivity/bladder spasm so I am going to give her samples of Myrbetriq 25 mg today to see if this helps. - 05/08/2018 Bladder tumor/neoplasm, She has at least 2 large bladder tumors involving the right wall of her bladder as well as multiple areas of papillary abnormality involving the dome and posterior wall the bladder as well. - 03/27/2018    NON-GU PMH: Pyuria/other UA findings - 06/25/2019    FAMILY HISTORY: None   SOCIAL HISTORY: Marital Status:  Married Preferred Language: English; Ethnicity: Not Hispanic Or Latino; Race: White Current Smoking Status: Patient does not smoke anymore.   Tobacco Use Assessment Completed: Used Tobacco in last 30 days? Has never drank.  Drinks 2 caffeinated drinks per day.    REVIEW OF SYSTEMS:    GU Review Female:   Patient reports frequent urination, hard to postpone urination, burning /pain with urination, get up at night to urinate, and leakage of urine. Patient denies stream starts and stops, trouble starting your stream, have to strain to urinate, and being pregnant.  Gastrointestinal (Upper):   Patient denies nausea, vomiting, and indigestion/ heartburn.  Gastrointestinal (Lower):   Patient reports diarrhea. Patient denies constipation.  Constitutional:   Patient reports fatigue. Patient denies fever, night sweats, and weight loss.  Skin:   Patient denies skin rash/ lesion and itching.  Eyes:   Patient denies blurred vision and double vision.  Ears/ Nose/ Throat:   Patient denies sore throat and sinus problems.  Hematologic/Lymphatic:   Patient denies swollen glands and easy bruising.  Cardiovascular:   Patient denies leg swelling and chest pains.  Respiratory:   Patient reports shortness of breath. Patient denies cough.  Endocrine:  Patient denies excessive thirst.  Musculoskeletal:   Patient denies joint pain and back pain.  Neurological:   Patient reports headaches and dizziness.   Psychologic:   Patient denies depression and anxiety.   VITAL SIGNS:    Weight 112 lb / 50.8 kg  Height 66 in / 167.64 cm  BP 137/73 mmHg  Pulse 68 /min  Temperature 97.8 F / 36.5 C  BMI 18.1 kg/m   MULTI-SYSTEM PHYSICAL EXAMINATION:    Constitutional: Well-nourished. No physical deformities. Normally developed. Good grooming.  Neck: Neck symmetrical, not swollen. Normal tracheal position.  Respiratory: No labored breathing, no use of accessory muscles.   Cardiovascular: Normal temperature, normal  extremity pulses, no swelling, no varicosities.  Lymphatic: No enlargement of neck, axillae, groin.  Skin: No paleness, no jaundice, no cyanosis. No lesion, no ulcer, no rash.  Neurologic / Psychiatric: Oriented to time, oriented to place, oriented to person. No depression, no anxiety, no agitation.  Gastrointestinal: No mass, no tenderness, no rigidity, non obese abdomen.  Eyes: Normal conjunctivae. Normal eyelids.  Ears, Nose, Mouth, and Throat: Left ear no scars, no lesions, no masses. Right ear no scars, no lesions, no masses. Nose no scars, no lesions, no masses. Normal hearing. Normal lips.  Musculoskeletal: Normal gait and station of head and neck.   Complexity of Data:  Records Review:   Previous Patient Records  Urine Test Review:   Urinalysis, Urine Culture  Urodynamics Review:   Review Bladder Scan  X-Ray Review: C.T. Abdomen/Pelvis: Reviewed Films. Reviewed Report.     PROCEDURES:         PVR Ultrasound - [id number]  Scanned Volume: 86 cc         Urinalysis w/Scope - 81001 Dipstick Dipstick Cont'd Micro  Color: Red Bilirubin: Invalid WBC/hpf: 0 - 5/hpf  Appearance: Turbid Ketones: Invalid RBC/hpf: Packed/hpf  Specific Gravity: Invalid Blood: Invalid Bacteria: Few (10-25/hpf)  pH: Invalid Protein: Invalid Cystals: NS (Not Seen)  Glucose: Invalid Urobilinogen: Invalid Casts: NS (Not Seen)    Nitrites: Invalid Trichomonas: Not Present    Leukocyte Esterase: Invalid Mucous: Not Present      Epithelial Cells: 0 - 5/hpf      Yeast: NS (Not Seen)      Sperm: Not Present    Notes:  Quantitation of elements in specimen may not be accurate due to RBC result.     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 Given progression of her symptoms, will plan to move forward with repeat CT imaging for further evaluation. Also will  plan to move forward with getting her scheduled for cystoscopy, possible clot evacuation, and TURBT. Depending on CT imaging findings, she may also benefit from referral to Dr. Alen Blew for further discussion of management options. Severe lower urinary tract symptoms are most likely secondary to her bladder cancer. She has noted benefit from 50 mg Myrbetriq and I am hopeful that we can continued to mitigate some of her symptoms with this. Samples were given and Rx sent. We discussed pain management strategies in detail today. Prescription for Hydrocodone-Acetaminophen was also sent to her pharmacy today for use with severe pain. We reviewed indications, usage, potential side effects of all medications prescribed today.

## 2019-11-07 NOTE — Discharge Instructions (Signed)

## 2019-11-07 NOTE — Telephone Encounter (Signed)
Our office received a new pt referral from Dr. Consuella Lose at [facility] for bladder cancer. Pt has been cld and scheduled to see Dr. Alen Blew on 5/14 at 2pm. [name] and her husband are aware to arrive 15 minutes early.

## 2019-11-08 NOTE — Progress Notes (Signed)
Anesthesia Chart Review   Case: [id number] Date/Time: 11/11/19 1025   Procedure: TRANSURETHRAL RESECTION OF BLADDER TUMOR (TURBT)VERSUS FULGURATION/ CYSTOSCOPY WITH  POSSIBLE CLOT EVACUATION (N/A )   Anesthesia type: General   Pre-op diagnosis: BLADDER CANCER, GROSS HEMATURIA   Location: [place] [facility]   Surgeons: Kathie Rhodes, MD      DISCUSSION:86 y.o. former smoker (quit 05/11/79) with h/o HTN, GERD, HLD, hypertrophic cardiomyopathy, CAD (DES to LAD 2012), COPD, OSA (nocturnal O2 use), bladder cancer, gross hematuria scheduled for above procedure 11/11/2019 with Dr. Kathie Rhodes.   Pt last seen by cardiologist, Dr. [name], 4/09/92021.  Per OV note, "Status post drug-eluting stent placement to the LAD in 2012 with patent stent by repeat cardiac catheterization July 2016. She underwent a low risk nuclear stress test in October 2019. Symptomatically stable. Continue aspirin (temporarily on hold for rectal bleeding), statin, and Imdur."  6 month follow up recommended.    Anticipate pt can proceed with planned procedure barring acute status change.   VS: BP 110/60   Pulse 72   Temp 36.7 C (Oral)   Resp 16   Ht 5\' 6"  (1.676 m)   Wt 50.6 kg   SpO2 93%   BMI 18.00 kg/m   PROVIDERS: Burdine, Virgina Evener, MD is PCP   [name], MD is Cardiologist  LABS: Labs reviewed: Acceptable for surgery. (all labs ordered are listed, but only abnormal results are displayed)  Labs Reviewed  HEMOGLOBIN A1C - Abnormal; Notable for the following components:      Result Value   Hgb A1c MFr Bld 5.9 (*)    All other components within normal limits  BASIC METABOLIC PANEL - Abnormal; Notable for the following components:   Glucose, Bld 130 (*)    All other components within normal limits  CBC - Abnormal; Notable for the following components:   RBC 3.68 (*)    MCV 109.2 (*)    All other components within normal limits     IMAGES:   EKG: 06/10/2019 Rate 60 bpm Normal  sinus rhythm  Non-specific intra-ventricular conduction delay  CV: Echo 10/09/2017 Conclusions: 1. Mild to moderate concentric left ventricular hypertrophy is observed.  2. Basal septal hypertrophy and systolic anterior motion of the mitral valve are observed creating an outflow tract gradient which is severe with peack velocity Greater than 5 m/s and peak gradient greater than 125 mmHg with valsalva.  3. The estimated ejection fraction is 55-60%.  4. The left ventricular diastolic filling pattern is consistent with pseudonormalization, grade II diastolic dysfunction.  5. The left ventricular diastolic filling pattern is consistent with elevated left ventricular end-diastolic pressure.  6.  The left atrium is mildly dilated 7. The right ventricular global systolic function is normal.  8. The aortic valve leaflets are mildly thickened.  9.  There is mild aortic regurgitation 10. The mitral valve leaflets are mildly thickened.   Echo 01/21/2015 Study Conclusions   - Left ventricle: The cavity size was normal. Wall thickness was at  the upper limits of normal with basal septal hypertrophy. There  is systolic anterior motion of the mitral apparatus. Systolic  function was vigorous. The estimated ejection fraction was in the  range of 65% to 70%. Wall motion was normal; there were no  regional wall motion abnormalities. Doppler parameters are  consistent with abnormal left ventricular relaxation (grade 1  diastolic dysfunction). Doppler parameters are consistent with  high ventricular filling pressure.  - Aortic valve: Mildly calcified  annulus. Trileaflet; mildly  calcified leaflets. There was mild regurgitation.  - Mitral valve: Mildly thickened, mildly calcified leaflets . There  was trivial regurgitation.  - Left atrium: The atrium was mildly dilated.  - Right atrium: Central venous pressure (est): 3 mm Hg.  - Atrial septum: There was increased thickness of the  septum,  consistent with lipomatous hypertrophy.  - Tricuspid valve: There was trivial regurgitation.  - Pulmonary arteries: PA peak pressure: 35 mm Hg (S).  - Pericardium, extracardiac: There was no pericardial effusion.   Cardiac Cath 01/19/2015  Normal right heart pressures. CO 4.5 L/min. CI 2.8.  Patent stent in the LAD.  Resting oxygen saturation 87%.  Mild aortic stenosis. 10 mm Hg gradiant across the valve.  Overall normal left ventricular function.  LVEDP 15 mm Hg.  The left ventricular systolic function is normal.  No infrarenal AAA. No significant right iliac artery disease.  If cardiac cath is needed in the future, would not attempt right radial access due to severe tortuosity in the right subclavian.   Consider pulmonary workup to evaluate why she is hypoxemic.  THis may be the cuase of her shortness of breath.  If cardiac cath is needed in the future, would not attempt right radial access due to severe tortuosity in the right subclavian. Past Medical History:  Diagnosis Date  . Arthritis    hands  . Benign neoplasm of pituitary gland (HCC)    per last mri 01-30-2015  slight decrease macro adenoma  . Bladder cancer ([place])   . Bladder tumor   . Borderline diabetes mellitus   . Cardiogenic shock ([place])    a. 05/2011 requiring pressor therapy.  . Carotid artery disease ([place])    per last duplex in epic 05-06-2015   bilateral ICA 1-39%  . Complication of anesthesia    prolonged sedation  . Constipation   . COPD (chronic obstructive pulmonary disease) ([place])   . Coronary artery disease cardiologist-  dr Bronson Ing   a. NSTEMI 05/2011 s/p DES to LAD, with ?Takotsubo distribution of cardiomyopathy at that time with cardiogenic shock. b. NSTEMI 06/2011 ?spasm - stable CAD, normal EF. c. Patent LAD stent, nonobst CAD by cath 08/20/12 with normal EF. d. Hx dyspnea with Brilinta. e. patent stent by cath in 01/2015  . Dependence on nocturnal oxygen therapy 2020  . DOE  (dyspnea on exertion)   . Dyspnea    with exertion and also noted some at rest  . Erosive gastritis   . Fatigue   . GERD (gastroesophageal reflux disease)   . Heart murmur   . Hematuria    passes blood clots occ  . Hemorrhoids   . Hiatal hernia   . History of gastritis 05/2014   w/ erosions  . History of GI bleed 05/2015   s/p  EGD with ablation duodenal polyp and gastric polypectomy with post gi bleed , s/p clipping  . History of non-ST elevation myocardial infarction (NSTEMI) 05/11/2011   w/ cardiogenic shock requiring pressor therapy,  and s/p  PCI and DES to LAD  . History of shingles   . History of skin cancer   . HOH (hard of hearing)    bilateral wears hearing aides  . Hyperlipidemia   . Hypertension   . Hypertrophic cardiomyopathy ([place])    a. echo 08/2012: focal basal septal hypertrophy, mildly elevated gradient 37mmHg across LVOT.  . Iron deficiency anemia due to chronic blood loss    hemotologist --- dr Lauree Chandler @ [facility]  office in [place],, hx IV iron infusions  . Melanoma ([place])    nose  . Mild obstructive sleep apnea    per pt no cpap recommendation  . Myocardial infarction ([place])   . Nasal polyp   . Neuropathy, peripheral   . Nocturia more than twice per night   . OAB (overactive bladder)   . Osteoporosis   . Pneumonia    childhood  . Polyp of stomach and duodenum   . Poor circulation    lower ext bil  . Pre-diabetes   . S/P drug eluting coronary stent placement 05/12/2011   to mid LAD  . Scoliosis   . Spider veins   . Stress-induced cardiomyopathy 05/2011   a. ?Takotubso cardiomyopathy - distribution was different than that of LAD stenosis.  Marland Kitchen Unsteady gait   . Urinary incontinence   . Vertigo, intermittent   . Vitamin D deficiency   . Wears hearing aid in both ears     Past Surgical History:  Procedure Laterality Date  . APPENDECTOMY  1953  . CARDIAC CATHETERIZATION  06/22/2011   patent LAD stent. Normal EF  . CARDIAC CATHETERIZATION   08/2012   Patent LAD stent with normal ejection fraction.  Marland Kitchen CARDIAC CATHETERIZATION N/A 01/19/2015   Procedure: Right/Left Heart Cath and Coronary Angiography;  Surgeon: Jettie Booze, MD;  Location: [place] CV LAB;  Service: Cardiovascular;  Laterality: N/A;  . CARDIOVASCULAR STRESS TEST  11-26-2014   dr Bronson Ing   Low risk nuclear study w/ no ischemia or infarct/  normal LV function and wall motion , ef 72%  . CATARACT EXTRACTION W/ INTRAOCULAR LENS  IMPLANT, BILATERAL  2009  approx.  Marland Kitchen CATARACT EXTRACTION, BILATERAL    . COLONOSCOPY    . CORONARY ANGIOPLASTY WITH STENT PLACEMENT  05-12-2011  dr Shelva Majestic   mid LAD: 2.5 X 16 mm Promus DES for 85-90% stenosis/  pCFx 30-40%, diagonal 20%, ef 40-45%/ ascending aorta stenosis 34mm gradient/  hypocontratility involve distal anterolateral apical and distal inferior wall  . CYSTOSCOPY N/A 06/10/2019   Procedure: CYSTOSCOPY WITH CLOT EVACUATION;  Surgeon: Kathie Rhodes, MD;  Location: [facility];  Service: Urology;  Laterality: N/A;  . ESOPHAGOGASTRODUODENOSCOPY N/A 05/23/2014   Procedure: ESOPHAGOGASTRODUODENOSCOPY (EGD);  Surgeon: Rogene Houston, MD;  [facility];  Service: Endoscopy;  Laterality: N/A;  230 - moved to 1:05 - Ann notified pt  . ESOPHAGOGASTRODUODENOSCOPY N/A 05/20/2015   Procedure: ESOPHAGOGASTRODUODENOSCOPY (EGD);  Surgeon: Rogene Houston, MD;  [facility];  Service: Endoscopy;  Laterality: N/A;  1:00  . ESOPHAGOGASTRODUODENOSCOPY N/A 05/28/2015   Procedure: ESOPHAGOGASTRODUODENOSCOPY (EGD);  Surgeon: Rogene Houston, MD;  [facility];  Service: Endoscopy;  Laterality: N/A;  . GIVENS CAPSULE STUDY N/A 05/12/2014   Procedure: GIVENS CAPSULE STUDY;  Surgeon: Rogene Houston, MD;  [facility];  Service: Endoscopy;  Laterality: N/A;  730  . LEFT HEART CATHETERIZATION WITH CORONARY ANGIOGRAM N/A 05/12/2011   Procedure: LEFT HEART CATHETERIZATION WITH CORONARY ANGIOGRAM;  Surgeon:  Troy Sine, MD;  Location: [facility] CATH LAB;  Service: Cardiovascular;  Laterality: N/A;  . LEFT HEART CATHETERIZATION WITH CORONARY ANGIOGRAM N/A 06/22/2011   Procedure: LEFT HEART CATHETERIZATION WITH CORONARY ANGIOGRAM;  Surgeon: Wellington Hampshire, MD;  Location: [place] CATH LAB;  Service: Cardiovascular;  Laterality: N/A;  . LEFT HEART CATHETERIZATION WITH CORONARY ANGIOGRAM N/A 08/20/2012   Procedure: LEFT HEART CATHETERIZATION WITH CORONARY ANGIOGRAM;  Surgeon: Burnell Blanks, MD;  Location:  [place] CATH LAB;  Service: Cardiovascular;  Laterality: N/A;  . NASAL POLYP EXCISION    . PARTIAL OOPHORECTOMY Right 1955   BENIGN  . PERCUTANEOUS CORONARY STENT INTERVENTION (PCI-S)  05/12/2011   Procedure: PERCUTANEOUS CORONARY STENT INTERVENTION (PCI-S);  Surgeon: Troy Sine, MD;  Location: [facility] CATH LAB;  Service: Cardiovascular;;  . POLYPECTOMY N/A 05/20/2015   Procedure: EGD with polypectomy;  Surgeon: Rogene Houston, MD;  [facility];  Service: Endoscopy;  Laterality: N/A;  . TRANSTHORACIC ECHOCARDIOGRAM  10-09-2017   dr Bronson Ing   ef 55-60%, moderate conentric LVH, grade 2 diastolic dysfunction/  basal septal hypertrophy with systolic anterior motion of the mitral valve outflow track peak grandient greater than 133mmHg with valsalva/  mild AR, MR, and TR  . TRANSURETHRAL RESECTION OF BLADDER TUMOR N/A 04/30/2018   Procedure: TRANSURETHRAL RESECTION OF BLADDER TUMOR (TURBT);  Surgeon: Kathie Rhodes, MD;  Location[facility];  Service: Urology;  Laterality: N/A;  . TRANSURETHRAL RESECTION OF BLADDER TUMOR N/A 06/10/2019   Procedure: TRANSURETHRAL RESECTION OF BLADDER TUMOR (TURBT) VERSUS FULGURATION;  Surgeon: Kathie Rhodes, MD;  Location: [facility];  Service: Urology;  Laterality: N/A;  . WISDOM TOOTH EXTRACTION      MEDICATIONS: . acetaminophen (TYLENOL) 500 MG tablet  . amLODipine (NORVASC) 5 MG tablet  . Ascorbic Acid (VITAMIN C WITH ROSE HIPS) 500 MG tablet  . aspirin EC 81 MG  tablet  . atorvastatin (LIPITOR) 10 MG tablet  . Cholecalciferol (VITAMIN D3) 50 MCG (2000 UT) TABS  . Ferrous Gluconate (IRON 27 PO)  . HYDROcodone-acetaminophen (NORCO/VICODIN) 5-325 MG tablet  . isosorbide mononitrate (IMDUR) 30 MG 24 hr tablet  . Meclizine HCl (TRAVEL SICKNESS) 25 MG CHEW  . Misc Natural Products (TOTAL MEMORY & FOCUS FORMULA PO)  . Multiple Vitamins-Minerals (PRESERVISION AREDS 2+MULTI VIT PO)  . phenazopyridine (PYRIDIUM) 200 MG tablet   No current facility-administered medications for this encounter.    Maia Plan [facility] Pre[facility]Surgical Testing [phone] 11/08/19  1:53 PM

## 2019-11-08 NOTE — Progress Notes (Signed)
Pt aware to arrive at [facility] admitting at 0830 on Monday 11/11/2019 for surgical procedure. Aware no food or drink after midnight.

## 2019-11-11 ENCOUNTER — Ambulatory Visit (HOSPITAL_COMMUNITY): Payer: Medicare Other | Admitting: Physician Assistant

## 2019-11-11 ENCOUNTER — Encounter (HOSPITAL_COMMUNITY): Payer: Self-pay | Admitting: Urology

## 2019-11-11 ENCOUNTER — Ambulatory Visit (HOSPITAL_COMMUNITY): Payer: Medicare Other | Admitting: Anesthesiology

## 2019-11-11 ENCOUNTER — Encounter (HOSPITAL_COMMUNITY)
Admission: RE | Disposition: A | Discharge status: Home / Self Care | Payer: Self-pay | Source: Home / Self Care | Attending: Urology

## 2019-11-11 ENCOUNTER — Ambulatory Visit (HOSPITAL_COMMUNITY)
Admission: RE | Admit: 2019-11-11 | Discharge: 2019-11-11 | Disposition: A | Discharge status: Home / Self Care | Payer: Medicare Other | Attending: Urology | Admitting: Urology

## 2019-11-11 DIAGNOSIS — C678 Malignant neoplasm of overlapping sites of bladder: Secondary | ICD-10-CM | POA: Diagnosis not present

## 2019-11-11 DIAGNOSIS — Z7982 Long term (current) use of aspirin: Secondary | ICD-10-CM | POA: Diagnosis not present

## 2019-11-11 DIAGNOSIS — I252 Old myocardial infarction: Secondary | ICD-10-CM | POA: Diagnosis not present

## 2019-11-11 DIAGNOSIS — I2510 Atherosclerotic heart disease of native coronary artery without angina pectoris: Secondary | ICD-10-CM | POA: Diagnosis not present

## 2019-11-11 DIAGNOSIS — Z87891 Personal history of nicotine dependence: Secondary | ICD-10-CM | POA: Diagnosis not present

## 2019-11-11 DIAGNOSIS — C672 Malignant neoplasm of lateral wall of bladder: Secondary | ICD-10-CM

## 2019-11-11 DIAGNOSIS — N311 Reflex neuropathic bladder, not elsewhere classified: Secondary | ICD-10-CM | POA: Diagnosis not present

## 2019-11-11 DIAGNOSIS — M1990 Unspecified osteoarthritis, unspecified site: Secondary | ICD-10-CM | POA: Diagnosis not present

## 2019-11-11 DIAGNOSIS — N3289 Other specified disorders of bladder: Secondary | ICD-10-CM | POA: Diagnosis not present

## 2019-11-11 DIAGNOSIS — Z923 Personal history of irradiation: Secondary | ICD-10-CM | POA: Insufficient documentation

## 2019-11-11 DIAGNOSIS — R310 Gross hematuria: Secondary | ICD-10-CM | POA: Insufficient documentation

## 2019-11-11 DIAGNOSIS — Z79899 Other long term (current) drug therapy: Secondary | ICD-10-CM | POA: Insufficient documentation

## 2019-11-11 DIAGNOSIS — I10 Essential (primary) hypertension: Secondary | ICD-10-CM | POA: Diagnosis not present

## 2019-11-11 DIAGNOSIS — K219 Gastro-esophageal reflux disease without esophagitis: Secondary | ICD-10-CM | POA: Insufficient documentation

## 2019-11-11 DIAGNOSIS — C679 Malignant neoplasm of bladder, unspecified: Secondary | ICD-10-CM | POA: Diagnosis present

## 2019-11-11 HISTORY — PX: TRANSURETHRAL RESECTION OF BLADDER TUMOR: SHX2575

## 2019-11-11 LAB — GLUCOSE, CAPILLARY: Glucose-Capillary: 99 mg/dL (ref 70–99)

## 2019-11-11 SURGERY — TURBT (TRANSURETHRAL RESECTION OF BLADDER TUMOR)
Anesthesia: General | Site: Bladder

## 2019-11-11 MED ORDER — HYDROCODONE-ACETAMINOPHEN 5-325 MG PO TABS
ORAL_TABLET | ORAL | Status: AC
  Administered 2019-11-11: 1
  Filled 2019-11-11: qty 1

## 2019-11-11 MED ORDER — PHENYLEPHRINE 40 MCG/ML (10ML) SYRINGE FOR IV PUSH (FOR BLOOD PRESSURE SUPPORT)
PREFILLED_SYRINGE | INTRAVENOUS | Status: DC | PRN
  Administered 2019-11-11 (×4): 40 ug via INTRAVENOUS

## 2019-11-11 MED ORDER — LIDOCAINE HCL (CARDIAC) PF 100 MG/5ML IV SOSY: PREFILLED_SYRINGE | INTRAVENOUS | Status: DC | PRN

## 2019-11-11 MED ORDER — ACETAMINOPHEN 500 MG PO TABS
1000.0000 mg | ORAL_TABLET | Freq: Once | ORAL | Status: AC
  Administered 2019-11-11: 1000 mg via ORAL
  Filled 2019-11-11: qty 2

## 2019-11-11 MED ORDER — FENTANYL CITRATE (PF) 100 MCG/2ML IJ SOLN
25.0000 ug | INTRAMUSCULAR | Status: DC | PRN
  Administered 2019-11-11 (×3): 25 ug via INTRAVENOUS

## 2019-11-11 MED ORDER — LIDOCAINE 2% (20 MG/ML) 5 ML SYRINGE
INTRAMUSCULAR | Status: AC
  Filled 2019-11-11: qty 5

## 2019-11-11 MED ORDER — CIPROFLOXACIN IN D5W 200 MG/100ML IV SOLN
200.0000 mg | Freq: Once | INTRAVENOUS | Status: AC
  Administered 2019-11-11: 200 mg via INTRAVENOUS
  Filled 2019-11-11: qty 100

## 2019-11-11 MED ORDER — LACTATED RINGERS IV SOLN: INTRAVENOUS | Status: DC

## 2019-11-11 MED ORDER — PROMETHAZINE HCL 25 MG/ML IJ SOLN: 6.2500 mg | INTRAMUSCULAR | Status: DC | PRN

## 2019-11-11 MED ORDER — FENTANYL CITRATE (PF) 100 MCG/2ML IJ SOLN
INTRAMUSCULAR | Status: DC | PRN
  Administered 2019-11-11: 50 ug via INTRAVENOUS
  Administered 2019-11-11 (×2): 25 ug via INTRAVENOUS

## 2019-11-11 MED ORDER — LIDOCAINE 2% (20 MG/ML) 5 ML SYRINGE
INTRAMUSCULAR | Status: DC | PRN
  Administered 2019-11-11: 70 mg via INTRAVENOUS

## 2019-11-11 MED ORDER — EPHEDRINE 5 MG/ML INJ
INTRAVENOUS | Status: AC
  Filled 2019-11-11: qty 10

## 2019-11-11 MED ORDER — ONDANSETRON HCL 4 MG/2ML IJ SOLN
INTRAMUSCULAR | Status: DC | PRN
  Administered 2019-11-11: 4 mg via INTRAVENOUS

## 2019-11-11 MED ORDER — HYDROCODONE-ACETAMINOPHEN 5-325 MG PO TABS: 1.0000 | ORAL_TABLET | Freq: Once | ORAL | Status: DC

## 2019-11-11 MED ORDER — FENTANYL CITRATE (PF) 100 MCG/2ML IJ SOLN
INTRAMUSCULAR | Status: AC
  Filled 2019-11-11: qty 2

## 2019-11-11 MED ORDER — FENTANYL CITRATE (PF) 100 MCG/2ML IJ SOLN
INTRAMUSCULAR | Status: AC
  Administered 2019-11-11: 25 ug via INTRAVENOUS
  Filled 2019-11-11: qty 2

## 2019-11-11 MED ORDER — PROPOFOL 10 MG/ML IV BOLUS
INTRAVENOUS | Status: DC | PRN
  Administered 2019-11-11: 50 mg via INTRAVENOUS

## 2019-11-11 MED ORDER — SODIUM CHLORIDE 0.9 % IR SOLN
Status: DC | PRN
  Administered 2019-11-11: 21000 mL

## 2019-11-11 MED ORDER — CELECOXIB 200 MG PO CAPS
200.0000 mg | ORAL_CAPSULE | Freq: Once | ORAL | Status: AC
  Administered 2019-11-11: 200 mg via ORAL
  Filled 2019-11-11: qty 1

## 2019-11-11 MED ORDER — LACTATED RINGERS IV SOLN
INTRAVENOUS | Status: DC | PRN
Start: 2019-11-11 — End: 2019-11-11

## 2019-11-11 SURGICAL SUPPLY — 21 items
BAG URINE DRAIN 2000ML AR STRL (UROLOGICAL SUPPLIES) ×1 IMPLANT
BAG URO CATCHER STRL LF (MISCELLANEOUS) ×2 IMPLANT
CATH FOLEY 2WAY SLVR  5CC 20FR (CATHETERS) ×1
CATH FOLEY 2WAY SLVR 5CC 20FR (CATHETERS) IMPLANT
ELECT BIVAP BIPO 22/24 DONUT (ELECTROSURGICAL) ×2
ELECT REM PT RETURN 15FT ADLT (MISCELLANEOUS) ×2 IMPLANT
ELECTRD BIVAP BIPO 22/24 DONUT (ELECTROSURGICAL) IMPLANT
EVACUATOR MICROVAS BLADDER (UROLOGICAL SUPPLIES) ×1 IMPLANT
GLOVE BIOGEL M 8.0 STRL (GLOVE) ×2 IMPLANT
GOWN STRL REUS W/TWL XL LVL3 (GOWN DISPOSABLE) ×2 IMPLANT
HOLDER FOLEY CATH W/STRAP (MISCELLANEOUS) ×1 IMPLANT
KIT TURNOVER KIT A (KITS) IMPLANT
LOOP CUT BIPOLAR 24F LRG (ELECTROSURGICAL) ×1 IMPLANT
MANIFOLD NEPTUNE II (INSTRUMENTS) ×2 IMPLANT
NS IRRIG 1000ML POUR BTL (IV SOLUTION) ×2 IMPLANT
PENCIL SMOKE EVACUATOR (MISCELLANEOUS) IMPLANT
SYR TOOMEY IRRIG 70ML (MISCELLANEOUS) ×2
SYRINGE TOOMEY IRRIG 70ML (MISCELLANEOUS) IMPLANT
TRAY CYSTO PACK (CUSTOM PROCEDURE TRAY) ×2 IMPLANT
TUBING CONNECTING 10 (TUBING) ×2 IMPLANT
TUBING UROLOGY SET (TUBING) ×2 IMPLANT

## 2019-11-11 NOTE — Progress Notes (Signed)
Dr. Karsten Ro at bedside, stated if urine clear when patient ready for d/c we may remove foley catheter.  If urine is not clear patient to go home with foley catheter in place and remove in office at next appointment with [facility]

## 2019-11-11 NOTE — Op Note (Signed)
PATIENT:  Bary Castilla  PRE-OPERATIVE DIAGNOSIS: 1.  Gross hematuria. 2.  Bladder cancer  POST-OPERATIVE DIAGNOSIS: Same  PROCEDURE: 1.  Cystoscopy with clot evacuation 2.  Transurethral resection of bladder tumor (3.5 cm) 3.  Fulguration of bleeders  SURGEON:  Claybon Jabs  INDICATION: Oudia Kautzman is a 86 year old female with locally advanced, high-grade, invasive transitional cell carcinoma the bladder.  She underwent radiation but could not tolerate sensitizing chemotherapy.  She has subsequently developed recurrent cancer on the right wall of her bladder and has undergone previous resection and fulguration in order to control bleeding in the past.  A recent CT scan reveals some progression of the disease locally with no distant disease and dystrophic calcification of the cancer within the bladder.  She is brought to the operating room today for evacuation of clots and resection of intravesical tumor with fulguration of any bleeding points.  ANESTHESIA:  General  EBL:  Minimal  DRAINS: 20 French Foley catheter  LOCAL MEDICATIONS USED:  None  SPECIMEN: None  Description of procedure: After informed consent the patient was taken to the operating room and placed on the table in a supine position. General anesthesia was then administered. Once fully anesthetized the patient was moved to the dorsal lithotomy position and the genitalia were sterilely prepped and draped in standard fashion. An official timeout was then performed.  The 28 French resectoscope with obturator was introduced into the bladder.  When I remove the obturator and noted old appearing blood.  I inserted the 20 degree lens with resectoscope element and noted a great deal of clot.  I then used a Toomey syringe to evacuate all clot from the bladder.  This allowed full inspection.  I did not see any new tumors but the area of the previous involvement of the right wall extending up to the dome was completely involved  with necrotic tumor with dystrophic calcification.  I resected a great deal of necrotic tumor and resected away all of the visible dystrophic calcification.  I resected the tumor as much as I felt was safe.  Once this was achieved I used the Microvasive evacuator to remove all of this resected material from the bladder and then reinspected the bladder.  There were some areas of bleeding so I switched to a button and used this to fulgurate the bed of the tumor and surrounding mucosa.  At the end of this there was no bleeding noted with the water turned off so I drained the bladder and inserted a Foley catheter.  This was then flushed and drained and again the drainage was completely clear.  This was connected to closed system drainage and the patient was awakened and taken to the recovery room in stable and satisfactory condition.  There were no intraoperative complications.  PLAN OF CARE: Discharge to home after PACU  PATIENT DISPOSITION:  PACU - hemodynamically stable.

## 2019-11-11 NOTE — Anesthesia Preprocedure Evaluation (Addendum)
Anesthesia Evaluation  Patient identified by MRN, date of birth, ID band Patient awake    Reviewed: Allergy & Precautions, NPO status , Patient's Chart, lab work & pertinent test results  History of Anesthesia Complications Negative for: history of anesthetic complications  Airway Mallampati: II  TM Distance: >3 FB Neck ROM: Full    Dental no notable dental hx. (+) Dental Advisory Given   Pulmonary shortness of breath, sleep apnea , pneumonia, former smoker,    Pulmonary exam normal        Cardiovascular hypertension, Pt. on medications + CAD, + Past MI and + DOE  + Valvular Problems/Murmurs  Rhythm:Regular Rate:Normal + Systolic murmurs Echo [id number] Conclusions: 1. Mild to moderate concentric left ventricular hypertrophy is observed.  2. Basal septal hypertrophy and systolic anterior motion of the mitral valve are observed creating an outflow tract gradient which is severe with peack velocity Greater than 5 m/s and peak gradient greater than 125 mmHg with valsalva.  3. The estimated ejection fraction is 55-60%.  4. The left ventricular diastolic filling pattern is consistent with pseudonormalization, grade II diastolic dysfunction.  5. The left ventricular diastolic filling pattern is consistent with elevated left ventricular end-diastolic pressure.  6.  The left atrium is mildly dilated 7. The right ventricular global systolic function is normal.  8. The aortic valve leaflets are mildly thickened.  9.  There is mild aortic regurgitation 10. The mitral valve leaflets are mildly thickened.    Echo 2016 - Left ventricle: The cavity size was normal. Wall thickness was at the upper limits of normal with basal septal hypertrophy. There is systolic anterior motion of the mitral apparatus. Systolic function was vigorous. The estimated ejection fraction was in the range of 65% to 70%. Wall motion was normal; there were no regional  wall motion abnormalities. Doppler parameters are consistent with abnormal left ventricular relaxation (grade 1 diastolic dysfunction). Doppler parameters are consistent with high ventricular filling pressure. - Aortic valve: Mildly calcified annulus. Trileaflet; mildly   calcified leaflets. There was mild regurgitation. - Mitral valve: Mildly thickened, mildly calcified leaflets . There  was trivial regurgitation. - Left atrium: The atrium was mildly dilated. - Right atrium: Central venous pressure (est): 3 mm Hg. - Atrial septum: There was increased thickness of the septum, consistent with lipomatous hypertrophy. - Tricuspid valve: There was trivial regurgitation. - Pulmonary arteries: PA peak pressure: 35 mm Hg (S). - Pericardium, extracardiac: There was no pericardial effusion.  Impressions: - Upper normal LV wall thickness with significant basal septal hypertrophy, LVEF 65-70%. Systolic anterior motion of the mitral apparatus noted. Grade 1 diastolic dysfunction with increased filling pressures. Mild left atrial enlargement. Mildly thickened  and calcified mitral valve with trivial mitral regurgitation.  Mildly sclerotic aortic valve with mild aortic regurgitation. Trivial tricuspid regurgitation with PASP 35 mmHg. Lipomatous  hypertrophy of the interatrial septum.   Neuro/Psych  Neuromuscular disease negative psych ROS   GI/Hepatic Neg liver ROS, hiatal hernia, PUD, GERD  Medicated,  Endo/Other  negative endocrine ROS  Renal/GU Bladder Cancer     Musculoskeletal  (+) Arthritis ,   Abdominal   Peds  Hematology  (+) Blood dyscrasia, anemia ,   Anesthesia Other Findings   Reproductive/Obstetrics negative OB ROS                            Anesthesia Physical  Anesthesia Plan  ASA: IV  Anesthesia Plan: General   Post-op Pain Management:  Induction: Intravenous  PONV Risk Score and Plan: 4 or greater and Ondansetron, Dexamethasone and  Treatment may vary due to age or medical condition  Airway Management Planned: LMA and Oral ETT  Additional Equipment:   Intra-op Plan:   Post-operative Plan: Extubation in OR  Informed Consent: I have reviewed the patients History and Physical, chart, labs and discussed the procedure including the risks, benefits and alternatives for the proposed anesthesia with the patient or authorized representative who has indicated his/her understanding and acceptance.     Dental advisory given  Plan Discussed with: CRNA and Anesthesiologist  Anesthesia Plan Comments:        Anesthesia Quick Evaluation

## 2019-11-11 NOTE — Anesthesia Postprocedure Evaluation (Signed)
Anesthesia Post Note  Patient: Victoria Short  Procedure(s) Performed: TRANSURETHRAL RESECTION OF BLADDER TUMOR (TURBT) and FULGURATION/ CYSTOSCOPY WITH  POSSIBLE CLOT EVACUATION (N/A Bladder)     Patient location during evaluation: PACU Anesthesia Type: General Level of consciousness: awake and alert and oriented Pain management: pain level controlled Vital Signs Assessment: post-procedure vital signs reviewed and stable Respiratory status: spontaneous breathing, nonlabored ventilation and respiratory function stable Cardiovascular status: blood pressure returned to baseline and stable Postop Assessment: no apparent nausea or vomiting Anesthetic complications: no    Last Vitals:  Vitals:   11/11/19 1115 11/11/19 1130  BP: 133/70 133/72  Pulse: 62 64  Resp: 15 14  Temp:    SpO2: 93% 97%    Last Pain:  Vitals:   11/11/19 1130  TempSrc:   PainSc: 6                  Doranne Schmutz A.

## 2019-11-11 NOTE — Anesthesia Procedure Notes (Signed)
Procedure Name: LMA Insertion Date/Time: 11/11/2019 9:55 AM Performed by: Lavina Hamman, CRNA Pre-anesthesia Checklist: Patient identified, Emergency Drugs available, Suction available and Patient being monitored Patient Re-evaluated:Patient Re-evaluated prior to induction Oxygen Delivery Method: Circle system utilized and Simple face mask Preoxygenation: Pre-oxygenation with 100% oxygen Induction Type: IV induction LMA: LMA inserted LMA Size: 4.0 Tube secured with: Tape

## 2019-11-11 NOTE — Transfer of Care (Signed)
Immediate Anesthesia Transfer of Care Note  Patient: Victoria Short  Procedure(s) Performed: TRANSURETHRAL RESECTION OF BLADDER TUMOR (TURBT) and FULGURATION/ CYSTOSCOPY WITH  POSSIBLE CLOT EVACUATION (N/A Bladder)  Patient Location: PACU  Anesthesia Type:General  Level of Consciousness: drowsy  Airway & Oxygen Therapy: Patient Spontanous Breathing and Patient connected to face mask oxygen  Post-op Assessment: Report given to RN and Post -op Vital signs reviewed and stable  Post vital signs: Reviewed and stable  Last Vitals:  Vitals Value Taken Time  BP    Temp    Pulse 64 11/11/19 1101  Resp 17 11/11/19 1101  SpO2 98 % 11/11/19 1101  Vitals shown include unvalidated device data.  Last Pain:  Vitals:   11/11/19 0848  TempSrc: Oral         Complications: No apparent anesthesia complications

## 2019-11-11 NOTE — Progress Notes (Signed)
Pt has short term memory loss.  Husband called to bedside to talk with Dr West Carbo and assist with pre op assessment questions and sign consent.

## 2019-11-12 ENCOUNTER — Encounter: Payer: Self-pay | Admitting: *Deleted

## 2019-11-15 ENCOUNTER — Inpatient Hospital Stay: Payer: Medicare Other | Attending: Oncology | Admitting: Oncology

## 2019-11-15 ENCOUNTER — Other Ambulatory Visit: Payer: Self-pay

## 2019-11-15 VITALS — BP 157/72 | HR 76 | Temp 98.9°F | Resp 17 | Ht 66.0 in | Wt 111.5 lb

## 2019-11-15 DIAGNOSIS — C672 Malignant neoplasm of lateral wall of bladder: Secondary | ICD-10-CM

## 2019-11-15 NOTE — Progress Notes (Signed)
Reason for the request:    Bladder cancer  HPI: I was asked by Dr. Karsten Ro to evaluate Victoria Short for the evaluation of bladder cancer.  She is a 86 year old woman diagnosis and 2019.  She was found to have hematuria with a work-up revealed a 2 solid tumors in the right wall that are suspicious for malignancy.  She underwent TURBT under the care of Dr. Karsten Ro and the pathology showed high-grade urothelial carcinoma with lymphovascular invasion and muscularis propria involvement.  She received definitive therapy which was completed in [place] after consultation with Dr. Tammi Klippel locally.  She continues to have intermittent cystoscopy under the care of Dr. Karsten Ro with tumor recurrence noted in December 2020 and TURBT at that time showed infiltrative high-grade urothelial carcinoma into the muscle on a specimen dated June 10, 2019.  She developed recurrence of her hematuria in May 2021 and underwent cystoscopy and clot evacuation on Nov 11, 2019.  She was found to have 3.5 cm bladder tumor that was resected and bleeding was fulgurated performed by Dr. Karsten Ro at that time.  Final pathology is not available at this time.   Previous imaging studies included PET CT scan obtained on September 19, 2018 showed no evidence of disease outside of her bladder at this time.  CT scan obtained on October 31, 2019 showed progression of bladder primary but no evidence of disease in the upper tract or abdominal adenopathy.  Clinically, she reports feeling reasonably fair without any major complaints.  She denies any abdominal pain or hematuria.  She denies any excessive fatigue or tiredness.  She does report some overall weakness and her mobility is limited at this time.  He does use a walker and wheelchair for mobility mostly.   She does not report any headaches, blurry vision, syncope or seizures. Does not report any fevers, chills or sweats.  Does not report any cough, wheezing or hemoptysis.  Does not report any chest pain,  palpitation, orthopnea or leg edema.  Does not report any nausea, vomiting or abdominal pain.  Does not report any constipation or diarrhea.  Does not report any skeletal complaints.    Does not report frequency, urgency or hematuria.  Does not report any skin rashes or lesions. Does not report any heat or cold intolerance.  Does not report any lymphadenopathy or petechiae.  Does not report any anxiety or depression.  Remaining review of systems is negative.    Past Medical History:  Diagnosis Date  . Arthritis    hands  . Benign neoplasm of pituitary gland (HCC)    per last mri 01-30-2015  slight decrease macro adenoma  . Bladder cancer ([place])   . Bladder tumor   . Borderline diabetes mellitus   . Cardiogenic shock ([place])    a. 05/2011 requiring pressor therapy.  . Carotid artery disease ([place])    per last duplex in epic 05-06-2015   bilateral ICA 1-39%  . Complication of anesthesia    prolonged sedation  . Constipation   . COPD (chronic obstructive pulmonary disease) ([place])   . Coronary artery disease cardiologist-  dr Bronson Ing   a. NSTEMI 05/2011 s/p DES to LAD, with ?Takotsubo distribution of cardiomyopathy at that time with cardiogenic shock. b. NSTEMI 06/2011 ?spasm - stable CAD, normal EF. c. Patent LAD stent, nonobst CAD by cath 08/20/12 with normal EF. d. Hx dyspnea with Brilinta. e. patent stent by cath in 01/2015  . Dependence on nocturnal oxygen therapy 2020  . DOE (dyspnea on exertion)   .  Dyspnea    with exertion and also noted some at rest  . Erosive gastritis   . Fatigue   . GERD (gastroesophageal reflux disease)   . Heart murmur   . Hematuria    passes blood clots occ  . Hemorrhoids   . Hiatal hernia   . History of gastritis 05/2014   w/ erosions  . History of GI bleed 05/2015   s/p  EGD with ablation duodenal polyp and gastric polypectomy with post gi bleed , s/p clipping  . History of non-ST elevation myocardial infarction (NSTEMI) 05/11/2011   w/ cardiogenic  shock requiring pressor therapy,  and s/p  PCI and DES to LAD  . History of shingles   . History of skin cancer   . HOH (hard of hearing)    bilateral wears hearing aides  . Hyperlipidemia   . Hypertension   . Hypertrophic cardiomyopathy ([place])    a. echo 08/2012: focal basal septal hypertrophy, mildly elevated gradient 56mmHg across LVOT.  . Iron deficiency anemia due to chronic blood loss    hemotologist --- dr Lauree Chandler @ [facility] office in [place],, hx IV iron infusions  . Melanoma ([place])    nose  . Mild obstructive sleep apnea    per pt no cpap recommendation  . Myocardial infarction ([place])   . Nasal polyp   . Neuropathy, peripheral   . Nocturia more than twice per night   . OAB (overactive bladder)   . Osteoporosis   . Pneumonia    childhood  . Polyp of stomach and duodenum   . Poor circulation    lower ext bil  . Pre-diabetes   . S/P drug eluting coronary stent placement 05/12/2011   to mid LAD  . Scoliosis   . Spider veins   . Stress-induced cardiomyopathy 05/2011   a. ?Takotubso cardiomyopathy - distribution was different than that of LAD stenosis.  Marland Kitchen Unsteady gait   . Urinary incontinence   . Vertigo, intermittent   . Vitamin D deficiency   . Wears hearing aid in both ears   :  Past Surgical History:  Procedure Laterality Date  . APPENDECTOMY  1953  . CARDIAC CATHETERIZATION  06/22/2011   patent LAD stent. Normal EF  . CARDIAC CATHETERIZATION  08/2012   Patent LAD stent with normal ejection fraction.  Marland Kitchen CARDIAC CATHETERIZATION N/A 01/19/2015   Procedure: Right/Left Heart Cath and Coronary Angiography;  Surgeon: Jettie Booze, MD;  Location: [facility];  Service: Cardiovascular;  Laterality: N/A;  . CARDIOVASCULAR STRESS TEST  11-26-2014   dr Bronson Ing   Low risk nuclear study w/ no ischemia or infarct/  normal LV function and wall motion , ef 72%  . CATARACT EXTRACTION W/ INTRAOCULAR LENS  IMPLANT, BILATERAL  2009  approx.  Marland Kitchen CATARACT  EXTRACTION, BILATERAL    . COLONOSCOPY    . CORONARY ANGIOPLASTY WITH STENT PLACEMENT  05-12-2011  dr Shelva Majestic   mid LAD: 2.5 X 16 mm Promus DES for 85-90% stenosis/  pCFx 30-40%, diagonal 20%, ef 40-45%/ ascending aorta stenosis 81mm gradient/  hypocontratility involve distal anterolateral apical and distal inferior wall  . CYSTOSCOPY N/A 06/10/2019   Procedure: CYSTOSCOPY WITH CLOT EVACUATION;  Surgeon: Kathie Rhodes, MD;  [facility]: [facility];  Service: Urology;  Laterality: N/A;  . ESOPHAGOGASTRODUODENOSCOPY N/A 05/23/2014   Procedure: ESOPHAGOGASTRODUODENOSCOPY (EGD);  Surgeon: Rogene Houston, MD;  [facility];  Service: Endoscopy;  Laterality: N/A;  230 - moved to 1:05 -  Ann notified pt  . ESOPHAGOGASTRODUODENOSCOPY N/A 05/20/2015   Procedure: ESOPHAGOGASTRODUODENOSCOPY (EGD);  Surgeon: Rogene Houston, MD;  [facility];  Service: Endoscopy;  Laterality: N/A;  1:00  . ESOPHAGOGASTRODUODENOSCOPY N/A 05/28/2015   Procedure: ESOPHAGOGASTRODUODENOSCOPY (EGD);  Surgeon: Rogene Houston, MD;  [facility];  Service: Endoscopy;  Laterality: N/A;  . GIVENS CAPSULE STUDY N/A 05/12/2014   Procedure: GIVENS CAPSULE STUDY;  Surgeon: Rogene Houston, MD;  [facility];  Service: Endoscopy;  Laterality: N/A;  730  . LEFT HEART CATHETERIZATION WITH CORONARY ANGIOGRAM N/A 05/12/2011   Procedure: LEFT HEART CATHETERIZATION WITH CORONARY ANGIOGRAM;  Surgeon: Troy Sine, MD;  Location: [facility] CATH LAB;  Service: Cardiovascular;  Laterality: N/A;  . LEFT HEART CATHETERIZATION WITH CORONARY ANGIOGRAM N/A 06/22/2011   Procedure: LEFT HEART CATHETERIZATION WITH CORONARY ANGIOGRAM;  Surgeon: Wellington Hampshire, MD;  Location: [facility]ATH LAB;  Service: Cardiovascular;  Laterality: N/A;  . LEFT HEART CATHETERIZATION WITH CORONARY ANGIOGRAM N/A 08/20/2012   Procedure: LEFT HEART CATHETERIZATION WITH CORONARY ANGIOGRAM;  Surgeon: Burnell Blanks, MD;  Location: [facility] CATH LAB;   Service: Cardiovascular;  Laterality: N/A;  . NASAL POLYP EXCISION    . PARTIAL OOPHORECTOMY Right 1955   BENIGN  . PERCUTANEOUS CORONARY STENT INTERVENTION (PCI-S)  05/12/2011   Procedure: PERCUTANEOUS CORONARY STENT INTERVENTION (PCI-S);  Surgeon: Troy Sine, MD;  Location: [facility] CATH LAB;  Service: Cardiovascular;;  . POLYPECTOMY N/A 05/20/2015   Procedure: EGD with polypectomy;  Surgeon: Rogene Houston, MD;  [facility];  Service: Endoscopy;  Laterality: N/A;  . TRANSTHORACIC ECHOCARDIOGRAM  10-09-2017   dr Bronson Ing   ef 55-60%, moderate conentric LVH, grade 2 diastolic dysfunction/  basal septal hypertrophy with systolic anterior motion of the mitral valve outflow track peak grandient greater than 146mmHg with valsalva/  mild AR, MR, and TR  . TRANSURETHRAL RESECTION OF BLADDER TUMOR N/A 04/30/2018   Procedure: TRANSURETHRAL RESECTION OF BLADDER TUMOR (TURBT);  Surgeon: Kathie Rhodes, MD;  [facility];  Service: Urology;  Laterality: N/A;  . TRANSURETHRAL RESECTION OF BLADDER TUMOR N/A 06/10/2019   Procedure: TRANSURETHRAL RESECTION OF BLADDER TUMOR (TURBT) VERSUS FULGURATION;  Surgeon: Kathie Rhodes, MD;  [facility];  Service: Urology;  Laterality: N/A;  . TRANSURETHRAL RESECTION OF BLADDER TUMOR N/A 11/11/2019   Procedure: TRANSURETHRAL RESECTION OF BLADDER TUMOR (TURBT) and FULGURATION/ CYSTOSCOPY WITH  POSSIBLE CLOT EVACUATION;  Surgeon: Kathie Rhodes, MD;  [facility];  Service: Urology;  Laterality: N/A;  . WISDOM TOOTH EXTRACTION    :   Current Outpatient Medications:  .  acetaminophen (TYLENOL) 500 MG tablet, Take 1,000 mg by mouth every 6 (six) hours as needed for moderate pain., Disp: , Rfl:  .  amLODipine (NORVASC) 5 MG tablet, Take 1 tablet (5 mg total) by mouth daily. (Patient taking differently: Take 5 mg by mouth every morning. ), Disp: 90 tablet, Rfl: 1 .  Ascorbic Acid (VITAMIN C WITH ROSE HIPS) 500 MG tablet, Take 500 mg by mouth 2 (two)  times daily. , Disp: , Rfl:  .  aspirin EC 81 MG tablet, Take 81 mg by mouth daily., Disp: , Rfl:  .  atorvastatin (LIPITOR) 10 MG tablet, Take 10 mg by mouth daily. , Disp: , Rfl: 3 .  Cholecalciferol (VITAMIN D3) 50 MCG (2000 UT) TABS, Take 2,000 Units by mouth 2 (two) times daily., Disp: , Rfl:  .  Ferrous Gluconate (IRON 27 PO), Take 27 mg by  mouth 2 (two) times daily., Disp: , Rfl:  .  HYDROcodone-acetaminophen (NORCO/VICODIN) 5-325 MG tablet, Take 1 tablet by mouth every 6 (six) hours as needed for moderate pain. , Disp: , Rfl:  .  isosorbide mononitrate (IMDUR) 30 MG 24 hr tablet, Take 30 mg by mouth daily. , Disp: , Rfl: 3 .  Meclizine HCl (TRAVEL SICKNESS) 25 MG CHEW, Chew 50 mg by mouth daily as needed (dizziness)., Disp: , Rfl:  .  mirabegron ER (MYRBETRIQ) 25 MG TB24 tablet, Take 25 mg by mouth daily., Disp: , Rfl:  .  Misc Natural Products (TOTAL MEMORY & FOCUS FORMULA PO), Take 1 tablet by mouth daily., Disp: , Rfl:  .  Multiple Vitamins-Minerals (PRESERVISION AREDS 2+MULTI VIT PO), Take 1 capsule by mouth in the morning and at bedtime., Disp: , Rfl: :  Allergies  Allergen Reactions  . Brilinta [Ticagrelor] Shortness Of Breath, Nausea And Vomiting and Other (See Comments)    dyspnea  . Iodine Nausea And Vomiting and Other (See Comments)    AND DIZZY  . Metoprolol Other (See Comments)    Bradycardia  . Shellfish Allergy Nausea And Vomiting and Other (See Comments)    Unsteady gait/  PER PT ONLY SHRIMP  . Streptomycin Nausea And Vomiting  . Tramadol     Pt is not herself   . Iodinated Diagnostic Agents Nausea Only  :  Family History  Problem Relation Age of Onset  . Heart attack Mother 85  . Diabetic kidney disease Father 20  . Prostate cancer Father   :  Social History   Socioeconomic History  . Marital status: Married    Spouse name: Not on file  . Number of children: Not on file  . Years of education: Not on file  . Highest education level: Not on file   Occupational History  . Not on file  Tobacco Use  . Smoking status: Former Smoker    Packs/day: 1.00    Years: 12.00    Pack years: 12.00    Types: Cigarettes    Quit date: 05/11/1979    Years since quitting: 40.5  . Smokeless tobacco: Never Used  Substance and Sexual Activity  . Alcohol use: Not Currently  . Drug use: No  . Sexual activity: Yes    Birth control/protection: Post-menopausal  Other Topics Concern  . Not on file  Social History Narrative  . Not on file   Social Determinants of Health   Financial Resource Strain:   . Difficulty of Paying Living Expenses:   Food Insecurity:   . Worried About Charity fundraiser in the Last Year:   . Arboriculturist in the Last Year:   Transportation Needs:   . Film/video editor (Medical):   Marland Kitchen Lack of Transportation (Non-Medical):   Physical Activity:   . Days of Exercise per Week:   . Minutes of Exercise per Session:   Stress:   . Feeling of Stress :   Social Connections:   . Frequency of Communication with Friends and Family:   . Frequency of Social Gatherings with Friends and Family:   . Attends Religious Services:   . Active Member of Clubs or Organizations:   . Attends Archivist Meetings:   Marland Kitchen Marital Status:   Intimate Partner Violence:   . Fear of Current or Ex-Partner:   . Emotionally Abused:   Marland Kitchen Physically Abused:   . Sexually Abused:   :  Pertinent items are noted in  HPI.  Exam: Blood pressure (!) 157/72, pulse 76, temperature 98.9 F (37.2 C), temperature source Temporal, resp. rate 17, height 5\' 6"  (1.676 m), weight 111 lb 8 oz (50.6 kg), SpO2 97 %.   ECOG 2 General appearance: alert and cooperative appeared without distress. Head: atraumatic without any abnormalities. Eyes: conjunctivae/corneas clear. PERRL.  Sclera anicteric. Throat: lips, mucosa, and tongue normal; without oral thrush or ulcers. Resp: clear to auscultation bilaterally without rhonchi, wheezes or dullness to  percussion. Cardio: regular rate and rhythm, S1, S2 normal, no murmur, click, rub or gallop GI: soft, non-tender; bowel sounds normal; no masses,  no organomegaly Skin: Skin color, texture, turgor normal. No rashes or lesions Lymph nodes: Cervical, supraclavicular, and axillary nodes normal. Neurologic: Grossly normal without any motor, sensory or deep tendon reflexes. Musculoskeletal: No joint deformity or effusion.    Assessment and Plan:   86 year old woman with:  1.  Recurrent bladder cancer diagnosed in 2019.  She presented with muscle invasive disease and hematuria.  She received definitive radiation therapy in the early part of 2020 with documented recurrent disease in December 2020 and in May 2021.  She has high-grade urothelial carcinoma with muscle invasion without any evidence of metastatic disease based on staging imaging studies in April 2021.   The natural course of this disease was reviewed today with the patient and her husband.  Treatment options were discussed at this time.  The most definitive approach will be given her recurrence and a radical cystectomy which she is not a candidate for.  Given her previous radiation as well as advanced age and overall poor performance status this surgery will not be tolerated.  The role for systemic therapy was discussed today in detail.  Systemic chemotherapy as well as immunotherapy was outlined at this time.  Given the fact that she has no measurable metastatic disease the role of systemic therapy here is unclear.  It would be reasonable to offer her if she has metastatic disease and she is interested in palliative approach.  Complication associated with this therapy were reviewed today with nausea, vomiting, myelosuppression as well as immune mediated complications.  Overall, she is not in favor of any systemic therapy at this time regardless of the indication.  She has refused chemotherapy in the past with radiation and right now she  would be a marginal candidate for it in any case.  Given her presentation and her wishes, I recommended continued active surveillance with repeat cystoscopy and fulguration if she develops any symptoms.  She understands that her disease will likely progress and there is potential risk of metastatic disease and ultimately death from this tumor.  She understands and accepts these facts at this time.  She is currently minimally symptomatic from her disease but she could develop recurrent symptoms in the future.  Hospice involvement could also be a consideration for her  2.  Follow-up: No follow-up is needed at this time.    60  minutes were dedicated to this visit. The time was spent on reviewing laboratory data, imaging studies, discussing treatment options, and answering questions regarding future plan.    Thank you for the referral.  I had the pleasure of meeting this patient today.  A copy of this consult has been forwarded to the requesting physician.

## 2019-11-25 ENCOUNTER — Other Ambulatory Visit: Payer: Self-pay | Admitting: Cardiovascular Disease

## 2020-02-19 ENCOUNTER — Other Ambulatory Visit: Payer: Self-pay | Admitting: Urology

## 2020-02-24 NOTE — Patient Instructions (Signed)
DUE TO COVID-19 ONLY ONE VISITOR IS ALLOWED TO COME WITH YOU AND STAY IN THE WAITING ROOM ONLY DURING PRE OP AND PROCEDURE DAY OF SURGERY. THE 1 VISITOR  MAY VISIT WITH YOU AFTER SURGERY IN YOUR PRIVATE ROOM DURING VISITING HOURS ONLY!  YOU NEED TO HAVE A COVID 19 TEST ON: 02/22/20, THIS TEST MUST BE DONE BEFORE SURGERY,  COVID TESTING SITE [address] [place] [address], IT IS ON THE RIGHT GOING OUT [address] APPROXIMATELY  2 MINUTES PAST [employer] ON THE RIGHT. ONCE YOUR COVID TEST IS COMPLETED,  PLEASE BEGIN THE QUARANTINE INSTRUCTIONS AS OUTLINED IN YOUR HANDOUT.                Kenlynn Houde    Your procedure is scheduled on: 02/26/20   Report to [facility] Main  Entrance   Report to admitting at: 9:45 AM     Call this number if you have problems the morning of surgery [phone]    Remember: Do not eat solid food :After Midnight. Clear liquids from midnight until 8:45 am   CLEAR LIQUID DIET   Foods Allowed                                                                     Foods Excluded  Coffee and tea, regular and decaf                             liquids that you cannot  Plain Jell-O any favor except red or purple                                           see through such as: Fruit ices (not with fruit pulp)                                     milk, soups, orange juice  Iced Popsicles                                    All solid food Carbonated beverages, regular and diet                                    Cranberry, grape and apple juices Sports drinks like Gatorade Lightly seasoned clear broth or consume(fat free) Sugar, honey syrup  Sample Menu Breakfast                                Lunch                                     Supper Cranberry juice  Beef broth                            Chicken broth Jell-O                                     Grape juice                           Apple juice Coffee or tea                         Jell-O                                      Popsicle                                                Coffee or tea                        Coffee or tea  _____________________________________________________________________  BRUSH YOUR TEETH MORNING OF SURGERY AND RINSE YOUR MOUTH OUT, NO CHEWING GUM CANDY OR MINTS.     Take these medicines the morning of surgery with A SIP OF WATER: amlodipine,isosorbide.                                You may not have any metal on your body including hair pins and              piercings  Do not wear jewelry, make-up, lotions, powders or perfumes, deodorant             Do not wear nail polish on your fingernails.  Do not shave  48 hours prior to surgery.     Do not bring valuables to the hospital. [place].  Contacts, dentures or bridgework may not be worn into surgery.  Leave suitcase in the car. After surgery it may be brought to your room.     Patients discharged the day of surgery will not be allowed to drive home. IF YOU ARE HAVING SURGERY AND GOING HOME THE SAME DAY, YOU MUST HAVE AN ADULT TO DRIVE YOU HOME AND BE WITH YOU FOR 24 HOURS. YOU MAY GO HOME BY TAXI OR UBER OR ORTHERWISE, BUT AN ADULT MUST ACCOMPANY YOU HOME AND STAY WITH YOU FOR 24 HOURS.  Name and phone number of your driver:  Special Instructions: N/A              Please read over the following fact sheets you were given: _____________________________________________________________________          [facility] - Preparing for Surgery Before surgery, you can play an important role.  Because skin is not sterile, your skin needs to be as free of germs as possible.  You can reduce the number of germs on your skin by washing with CHG (chlorahexidine gluconate) soap before surgery.  CHG  is an antiseptic cleaner which kills germs and bonds with the skin to continue killing germs even after washing. Please DO NOT use if you  have an allergy to CHG or antibacterial soaps.  If your skin becomes reddened/irritated stop using the CHG and inform your nurse when you arrive at Short Stay. Do not shave (including legs and underarms) for at least 48 hours prior to the first CHG shower.  You may shave your face/neck. Please follow these instructions carefully:  1.  Shower with CHG Soap the night before surgery and the  morning of Surgery.  2.  If you choose to wash your hair, wash your hair first as usual with your  normal  shampoo.  3.  After you shampoo, rinse your hair and body thoroughly to remove the  shampoo.                           4.  Use CHG as you would any other liquid soap.  You can apply chg directly  to the skin and wash                       Gently with a scrungie or clean washcloth.  5.  Apply the CHG Soap to your body ONLY FROM THE NECK DOWN.   Do not use on face/ open                           Wound or open sores. Avoid contact with eyes, ears mouth and genitals (private parts).                       Wash face,  Genitals (private parts) with your normal soap.             6.  Wash thoroughly, paying special attention to the area where your surgery  will be performed.  7.  Thoroughly rinse your body with warm water from the neck down.  8.  DO NOT shower/wash with your normal soap after using and rinsing off  the CHG Soap.                9.  Pat yourself dry with a clean towel.            10.  Wear clean pajamas.            11.  Place clean sheets on your bed the night of your first shower and do not  sleep with pets. Day of Surgery : Do not apply any lotions/deodorants the morning of surgery.  Please wear clean clothes to the hospital/surgery center.  FAILURE TO FOLLOW THESE INSTRUCTIONS MAY RESULT IN THE CANCELLATION OF YOUR SURGERY PATIENT SIGNATURE_________________________________  NURSE  SIGNATURE__________________________________  ________________________________________________________________________

## 2020-02-25 ENCOUNTER — Encounter (HOSPITAL_COMMUNITY): Payer: Self-pay

## 2020-02-25 ENCOUNTER — Encounter (HOSPITAL_COMMUNITY)
Admission: RE | Admit: 2020-02-25 | Discharge: 2020-02-25 | Disposition: A | Discharge status: Home / Self Care | Payer: Medicare Other | Source: Ambulatory Visit | Attending: Urology | Admitting: Urology

## 2020-02-25 ENCOUNTER — Other Ambulatory Visit: Payer: Self-pay

## 2020-02-25 ENCOUNTER — Other Ambulatory Visit (HOSPITAL_COMMUNITY)
Admission: RE | Admit: 2020-02-25 | Discharge: 2020-02-25 | Disposition: A | Discharge status: Home / Self Care | Payer: Medicare Other | Source: Ambulatory Visit | Attending: Urology | Admitting: Urology

## 2020-02-25 DIAGNOSIS — Z20822 Contact with and (suspected) exposure to covid-19: Secondary | ICD-10-CM | POA: Insufficient documentation

## 2020-02-25 DIAGNOSIS — Z01812 Encounter for preprocedural laboratory examination: Secondary | ICD-10-CM | POA: Insufficient documentation

## 2020-02-25 HISTORY — DX: Chronic kidney disease, unspecified: N18.9

## 2020-02-25 LAB — CBC
HCT: 33.3 % — ABNORMAL LOW (ref 36.0–46.0)
Hemoglobin: 9.9 g/dL — ABNORMAL LOW (ref 12.0–15.0)
MCH: 29.9 pg (ref 26.0–34.0)
MCHC: 29.7 g/dL — ABNORMAL LOW (ref 30.0–36.0)
MCV: 100.6 fL — ABNORMAL HIGH (ref 80.0–100.0)
Platelets: 314 10*3/uL (ref 150–400)
RBC: 3.31 MIL/uL — ABNORMAL LOW (ref 3.87–5.11)
RDW: 16 % — ABNORMAL HIGH (ref 11.5–15.5)
WBC: 9.8 10*3/uL (ref 4.0–10.5)
nRBC: 0 % (ref 0.0–0.2)

## 2020-02-25 LAB — BASIC METABOLIC PANEL
Anion gap: 7 (ref 5–15)
BUN: 19 mg/dL (ref 8–23)
CO2: 29 mmol/L (ref 22–32)
Calcium: 8.9 mg/dL (ref 8.9–10.3)
Chloride: 100 mmol/L (ref 98–111)
Creatinine, Ser: 0.67 mg/dL (ref 0.44–1.00)
GFR calc Af Amer: >60 mL/min (ref >60)
GFR calc non Af Amer: >60 mL/min (ref >60)
Glucose, Bld: 172 mg/dL — ABNORMAL HIGH (ref 70–99)
Potassium: 4 mmol/L (ref 3.5–5.1)
Sodium: 136 mmol/L (ref 135–145)

## 2020-02-25 LAB — HEMOGLOBIN A1C
Hgb A1c MFr Bld: 6 % — ABNORMAL HIGH (ref 4.8–5.6)
Mean Plasma Glucose: 126 mg/dL

## 2020-02-25 LAB — SARS CORONAVIRUS 2 (TAT 6-24 HRS): SARS Coronavirus 2: NEGATIVE

## 2020-02-25 NOTE — Progress Notes (Signed)
Lab. Results: Hemoglobin: 9.9

## 2020-02-25 NOTE — Progress Notes (Signed)
COVID Vaccine Completed:Yes Date COVID Vaccine completed: COVID vaccine manufacturer: [employer]   PCP - Dr. Judd Lien. LOV: 11/01/19 Cardiologist - Dr. [name]. LOV: 10/11/19  Chest x-ray -  EKG - 06/10/19 EPIC Stress Test -  ECHO - 10/09/17 Cardiac Cath - 01/17/15  Sleep Study -  CPAP -   Fasting Blood Sugar -  Checks Blood Sugar _____ times a day  Blood Thinner Instructions: Aspirin Instructions: Last Dose:  Anesthesia review: Hx: NSTEMI,heart murmur,COPD(3L O2 @ night time)  Patient denies shortness of breath, fever, cough and chest pain at PAT appointment   Patient verbalized understanding of instructions that were given to them at the PAT appointment. Patient was also instructed that they will need to review over the PAT instructions again at home before surgery.

## 2020-02-25 NOTE — Anesthesia Preprocedure Evaluation (Addendum)
Anesthesia Evaluation  Patient identified by MRN, date of birth, ID band Patient awake    Reviewed: Allergy & Precautions, NPO status , Patient's Chart, lab work & pertinent test results  History of Anesthesia Complications Negative for: history of anesthetic complications  Airway Mallampati: II  TM Distance: >3 FB Neck ROM: Full    Dental no notable dental hx.    Pulmonary sleep apnea and Oxygen sleep apnea , COPD, former smoker,    Pulmonary exam normal        Cardiovascular hypertension, Pt. on medications + CAD, + Past MI and + Cardiac Stents (2012)  Normal cardiovascular exam  TTE 2019: mild-moderate LVH, basal septal hypertrophy with SAM of MV, EF 55-60%, grade 2 DD, mild LAE, mild AR/MR/TR, mild pulmonary HTN   Neuro/Psych negative neurological ROS  negative psych ROS   GI/Hepatic Neg liver ROS, hiatal hernia, GERD  ,  Endo/Other  negative endocrine ROS  Renal/GU Renal InsufficiencyRenal disease  negative genitourinary   Musculoskeletal  (+) Arthritis ,   Abdominal   Peds  Hematology  (+) anemia , Hgb 9.9   Anesthesia Other Findings Day of surgery medications reviewed with patient.  Reproductive/Obstetrics negative OB ROS                            Anesthesia Physical Anesthesia Plan  ASA: IV  Anesthesia Plan: General   Post-op Pain Management:    Induction: Intravenous  PONV Risk Score and Plan: 3 and Treatment may vary due to age or medical condition and Ondansetron  Airway Management Planned: LMA  Additional Equipment: None  Intra-op Plan:   Post-operative Plan: Extubation in OR  Informed Consent: I have reviewed the patients History and Physical, chart, labs and discussed the procedure including the risks, benefits and alternatives for the proposed anesthesia with the patient or authorized representative who has indicated his/her understanding and acceptance.      Dental advisory given  Plan Discussed with: CRNA  Anesthesia Plan Comments:        Anesthesia Quick Evaluation

## 2020-02-25 NOTE — Progress Notes (Signed)
Anesthesia Chart Review   Case: [id number] Date/Time: 02/26/20 1130   Procedure: CYSTOSCOPY WITH FULGERATION AND CLOT EVACUATION (N/A ) - 1 HR   Anesthesia type: General   Pre-op diagnosis: GROSS HEMATURIA, BLADDER CANCER   Location: [place] [facility]   Surgeons: Alexis Frock, MD      DISCUSSION:86 y.o. former smoker (12 pack years, quit 05/11/79) with h/o HTN, GERD, HLD, carotid artery disease, CAD (DES to mid LAD 05/2011), COPD, OSA (nocturnal O2 use), CKD, gross hematuria, bladder cancer scheduled for above procedure 02/26/2020 with Dr. Alexis Frock.   Pt last seen by cardiologist, Dr. [name], 4/09/92021.  Per OV note, "Status post drug-eluting stent placement to the LAD in 2012 with patent stent by repeat cardiac catheterization July 2016. She underwent a low risk nuclear stress test in October 2019. Symptomatically stable. Continue aspirin(temporarily on hold for rectal bleeding), statin, and Imdur."  6 month follow up recommended.    S/p TURBT 11/11/2019.  ASA IV.  No anesthesia complications noted.    Anticipate pt can proceed with planned procedure barring acute status change.   VS: BP 112/63   Pulse 66   Temp (!) 36.4 C (Oral)   Resp (!) 22   Ht 5\' 8"  (1.727 m)   Wt 44.5 kg   BMI 14.90 kg/m   PROVIDERS: Burdine, Virgina Evener, MD is PCP   [name], MD is Cardiologist  LABS: Labs reviewed: Acceptable for surgery. (all labs ordered are listed, but only abnormal results are displayed)  Labs Reviewed  CBC - Abnormal; Notable for the following components:      Result Value   RBC 3.31 (*)    Hemoglobin 9.9 (*)    HCT 33.3 (*)    MCV 100.6 (*)    MCHC 29.7 (*)    RDW 16.0 (*)    All other components within normal limits  BASIC METABOLIC PANEL - Abnormal; Notable for the following components:   Glucose, Bld 172 (*)    All other components within normal limits  HEMOGLOBIN A1C     IMAGES:   EKG: 06/10/2019 Rate 60 bpm   NSR Non-specific intra-ventricular conduction delay   CV: Myocardial Perfusion 04/05/2018  There was no ST segment deviation noted during stress.  The study is normal. There are no perfusion defects consistent with prior infarct or current ischemia.  This is a low risk study.  The left ventricular ejection fraction is hyperdynamic (>65%).  Echo 10/09/2017 Conclusions:  1. Mild to moderate concentric left ventricular hypertrophy is observed.  2. Basal septal hypertrophy and systolic anterior motion of the mitral valve are observed creating an outflow tract gradient which is severe with peak velocity Greater Than 5 m/s and peak gradient Greater than 125 mm Hg with valsalva.  3. The estimated ejection fraction is 55-60%.  4. The left ventricular diastolic filling pattern is consistent with pseudonormalization, grade II diastolic dysfunction.  5.  The left ventricular diastolic filling pattern is consistent with elevated left ventricular end-diastolic pressure.  6. The left atrium is mildly dilated.  7. The right ventricular global systolic function is normal.  8. The aortic valve leaflets are mildly thickened.  9. There is mild aortic regurgitation 10. The mitral valve leaflets are mildly thickened.  11. There is mild mitral regurgitation 12. There is evidence of mild pulmonary hypertension 13. The right ventricular systolic pressure is calculated at 38 mmHg 14. There is mild tricuspid regurgitation  Cardiac Cath 01/19/2015  Normal right heart  pressures. CO 4.5 L/min. CI 2.8.  Patent stent in the LAD.  Resting oxygen saturation 87%.  Mild aortic stenosis. 10 mm Hg gradiant across the valve.  Overall normal left ventricular function.  LVEDP 15 mm Hg.  The left ventricular systolic function is normal.  No infrarenal AAA. No significant right iliac artery disease.  If cardiac cath is needed in the future, would not attempt right radial access due to severe tortuosity in the  right subclavian.   Consider pulmonary workup to evaluate why she is hypoxemic.  THis may be the cuase of her shortness of breath.  If cardiac cath is needed in the future, would not attempt right radial access due to severe tortuosity in the right subclavian  Echo 01/21/2015 Study Conclusions   - Left ventricle: The cavity size was normal. Wall thickness was at  the upper limits of normal with basal septal hypertrophy. There  is systolic anterior motion of the mitral apparatus. Systolic  function was vigorous. The estimated ejection fraction was in the  range of 65% to 70%. Wall motion was normal; there were no  regional wall motion abnormalities. Doppler parameters are  consistent with abnormal left ventricular relaxation (grade 1  diastolic dysfunction). Doppler parameters are consistent with  high ventricular filling pressure.  - Aortic valve: Mildly calcified annulus. Trileaflet; mildly  calcified leaflets. There was mild regurgitation.  - Mitral valve: Mildly thickened, mildly calcified leaflets . There  was trivial regurgitation.  - Left atrium: The atrium was mildly dilated.  - Right atrium: Central venous pressure (est): 3 mm Hg.  - Atrial septum: There was increased thickness of the septum,  consistent with lipomatous hypertrophy.  - Tricuspid valve: There was trivial regurgitation.  - Pulmonary arteries: PA peak pressure: 35 mm Hg (S).  - Pericardium, extracardiac: There was no pericardial effusion.  Past Medical History:  Diagnosis Date  . Arthritis    hands  . Benign neoplasm of pituitary gland (HCC)    per last mri 01-30-2015  slight decrease macro adenoma  . Bladder cancer ([place])   . Bladder tumor   . Borderline diabetes mellitus   . Cardiogenic shock ([place])    a. 05/2011 requiring pressor therapy.  . Carotid artery disease ([place])    per last duplex in epic 05-06-2015   bilateral ICA 1-39%  . Chronic kidney disease   . Complication of  anesthesia    prolonged sedation  . Constipation   . COPD (chronic obstructive pulmonary disease) ([place])   . Coronary artery disease cardiologist-  dr Bronson Ing   a. NSTEMI 05/2011 s/p DES to LAD, with ?Takotsubo distribution of cardiomyopathy at that time with cardiogenic shock. b. NSTEMI 06/2011 ?spasm - stable CAD, normal EF. c. Patent LAD stent, nonobst CAD by cath 08/20/12 with normal EF. d. Hx dyspnea with Brilinta. e. patent stent by cath in 01/2015  . Dependence on nocturnal oxygen therapy 2020  . DOE (dyspnea on exertion)   . Dyspnea    with exertion and also noted some at rest  . Erosive gastritis   . Fatigue   . GERD (gastroesophageal reflux disease)   . Heart murmur   . Hematuria    passes blood clots occ  . Hemorrhoids   . Hiatal hernia   . History of gastritis 05/2014   w/ erosions  . History of GI bleed 05/2015   s/p  EGD with ablation duodenal polyp and gastric polypectomy with post gi bleed , s/p clipping  . History of  non-ST elevation myocardial infarction (NSTEMI) 05/11/2011   w/ cardiogenic shock requiring pressor therapy,  and s/p  PCI and DES to LAD  . History of shingles   . History of skin cancer   . HOH (hard of hearing)    bilateral wears hearing aides  . Hyperlipidemia   . Hypertension   . Hypertrophic cardiomyopathy ([place])    a. echo 08/2012: focal basal septal hypertrophy, mildly elevated gradient 31mmHg across LVOT.  . Iron deficiency anemia due to chronic blood loss    hemotologist --- dr Lauree Chandler @ [facility] office in [place],, hx IV iron infusions  . Melanoma ([place])    nose  . Mild obstructive sleep apnea    per pt no cpap recommendation,uses oxigen(3L) at night time  . Myocardial infarction ([place])   . Nasal polyp   . Neuropathy, peripheral   . Nocturia more than twice per night   . OAB (overactive bladder)   . Osteoporosis   . Pneumonia    childhood  . Polyp of stomach and duodenum   . Poor circulation    lower ext bil  . Pre-diabetes    . S/P drug eluting coronary stent placement 05/12/2011   to mid LAD  . Scoliosis   . Spider veins   . Stress-induced cardiomyopathy 05/2011   a. ?Takotubso cardiomyopathy - distribution was different than that of LAD stenosis.  Marland Kitchen Unsteady gait   . Urinary incontinence   . Vertigo, intermittent   . Vitamin D deficiency   . Wears hearing aid in both ears     Past Surgical History:  Procedure Laterality Date  . APPENDECTOMY  1953  . CARDIAC CATHETERIZATION  06/22/2011   patent LAD stent. Normal EF  . CARDIAC CATHETERIZATION  08/2012   Patent LAD stent with normal ejection fraction.  Marland Kitchen CARDIAC CATHETERIZATION N/A 01/19/2015   Procedure: Right/Left Heart Cath and Coronary Angiography;  Surgeon: Jettie Booze, MD;  Location: [place] [facility];  Service: Cardiovascular;  Laterality: N/A;  . CARDIOVASCULAR STRESS TEST  11-26-2014   dr Bronson Ing   Low risk nuclear study w/ no ischemia or infarct/  normal LV function and wall motion , ef 72%  . CATARACT EXTRACTION W/ INTRAOCULAR LENS  IMPLANT, BILATERAL  2009  approx.  Marland Kitchen CATARACT EXTRACTION, BILATERAL    . COLONOSCOPY    . CORONARY ANGIOPLASTY WITH STENT PLACEMENT  05-12-2011  dr Shelva Majestic   mid LAD: 2.5 X 16 mm Promus DES for 85-90% stenosis/  pCFx 30-40%, diagonal 20%, ef 40-45%/ ascending aorta stenosis 10mm gradient/  hypocontratility involve distal anterolateral apical and distal inferior wall  . CYSTOSCOPY N/A 06/10/2019   Procedure: CYSTOSCOPY WITH CLOT EVACUATION;  Surgeon: Kathie Rhodes, MD;  Location: [facility];  Service: Urology;  Laterality: N/A;  . ESOPHAGOGASTRODUODENOSCOPY N/A 05/23/2014   Procedure: ESOPHAGOGASTRODUODENOSCOPY (EGD);  Surgeon: Rogene Houston, MD;  Location: [facility];  Service: Endoscopy;  Laterality: N/A;  230 - moved to 1:05 - Ann notified pt  . ESOPHAGOGASTRODUODENOSCOPY N/A 05/20/2015   Procedure: ESOPHAGOGASTRODUODENOSCOPY (EGD);  Surgeon: Rogene Houston, MD;  [facility]: [facility];   Service: Endoscopy;  Laterality: N/A;  1:00  . ESOPHAGOGASTRODUODENOSCOPY N/A 05/28/2015   Procedure: ESOPHAGOGASTRODUODENOSCOPY (EGD);  Surgeon: Rogene Houston, MD;  [facility]: [facility];  Service: Endoscopy;  Laterality: N/A;  . GIVENS CAPSULE STUDY N/A 05/12/2014   Procedure: GIVENS CAPSULE STUDY;  Surgeon: Rogene Houston, MD;  [facility];  Service: Endoscopy;  Laterality:  N/A;  730  . LEFT HEART CATHETERIZATION WITH CORONARY ANGIOGRAM N/A 05/12/2011   Procedure: LEFT HEART CATHETERIZATION WITH CORONARY ANGIOGRAM;  Surgeon: Troy Sine, MD;  Location[facility] C[facility] LAB;  Service: Cardiovascular;  Laterality: N/A;  . LEFT HEART CATHETERIZATION WITH CORONARY ANGIOGRAM N/A 06/22/2011   Procedure: LEFT HEART CATHETERIZATION WITH CORONARY ANGIOGRAM;  Surgeon: Wellington Hampshire, MD;  Location: [place] CATH LAB;  Service: Cardiovascular;  Laterality: N/A;  . LEFT HEART CATHETERIZATION WITH CORONARY ANGIOGRAM N/A 08/20/2012   Procedure: LEFT HEART CATHETERIZATION WITH CORONARY ANGIOGRAM;  Surgeon: Burnell Blanks, MD;  Location: [facility] CATH LAB;  Service: Cardiovascular;  Laterality: N/A;  . NASAL POLYP EXCISION    . PARTIAL OOPHORECTOMY Right 1955   BENIGN  . PERCUTANEOUS CORONARY STENT INTERVENTION (PCI-S)  05/12/2011   Procedure: PERCUTANEOUS CORONARY STENT INTERVENTION (PCI-S);  Surgeon: Troy Sine, MD;  Location: [facility] CATH LAB;  Service: Cardiovascular;;  . POLYPECTOMY N/A 05/20/2015   Procedure: EGD with polypectomy;  Surgeon: Rogene Houston, MD;  [facility];  Service: Endoscopy;  Laterality: N/A;  . TRANSTHORACIC ECHOCARDIOGRAM  10-09-2017   dr Bronson Ing   ef 55-60%, moderate conentric LVH, grade 2 diastolic dysfunction/  basal septal hypertrophy with systolic anterior motion of the mitral valve outflow track peak grandient greater than 173mmHg with valsalva/  mild AR, MR, and TR  . TRANSURETHRAL RESECTION OF BLADDER TUMOR N/A 04/30/2018   Procedure: TRANSURETHRAL  RESECTION OF BLADDER TUMOR (TURBT);  Surgeon: Kathie Rhodes, MD;  Location: [facility];  Service: Urology;  Laterality: N/A;  . TRANSURETHRAL RESECTION OF BLADDER TUMOR N/A 06/10/2019   Procedure: TRANSURETHRAL RESECTION OF BLADDER TUMOR (TURBT) VERSUS FULGURATION;  Surgeon: Kathie Rhodes, MD;  Location: [facility];  Service: Urology;  Laterality: N/A;  . TRANSURETHRAL RESECTION OF BLADDER TUMOR N/A 11/11/2019   Procedure: TRANSURETHRAL RESECTION OF BLADDER TUMOR (TURBT) and FULGURATION/ CYSTOSCOPY WITH  POSSIBLE CLOT EVACUATION;  Surgeon: Kathie Rhodes, MD;  Location: [facility];  Service: Urology;  Laterality: N/A;  . WISDOM TOOTH EXTRACTION      MEDICATIONS: . acetaminophen (TYLENOL) 500 MG tablet  . amLODipine (NORVASC) 5 MG tablet  . Ascorbic Acid (VITAMIN C WITH ROSE HIPS) 500 MG tablet  . atorvastatin (LIPITOR) 10 MG tablet  . Calcium Carb-Cholecalciferol (CALCIUM 600+D) 600-800 MG-UNIT TABS  . Ferrous Gluconate (IRON 27 PO)  . isosorbide mononitrate (IMDUR) 30 MG 24 hr tablet  . Meclizine HCl (TRAVEL SICKNESS) 25 MG CHEW  . Misc Natural Products (TOTAL MEMORY & FOCUS FORMULA PO)  . Multiple Vitamins-Minerals (PRESERVISION AREDS 2+MULTI VIT PO)  . traMADol (ULTRAM) 50 MG tablet  . VITAMIN E, TOPICAL, CREA   No current facility-administered medications for this encounter.    Konrad Felix, PA-C [facility]Surgical [facility] [phone]

## 2020-02-26 ENCOUNTER — Ambulatory Visit (HOSPITAL_COMMUNITY): Payer: Medicare Other | Admitting: Physician Assistant

## 2020-02-26 ENCOUNTER — Ambulatory Visit (HOSPITAL_COMMUNITY)
Admission: RE | Admit: 2020-02-26 | Discharge: 2020-02-26 | Disposition: A | Discharge status: Home / Self Care | Payer: Medicare Other | Attending: Urology | Admitting: Urology

## 2020-02-26 ENCOUNTER — Ambulatory Visit (HOSPITAL_COMMUNITY): Payer: Medicare Other

## 2020-02-26 ENCOUNTER — Encounter (HOSPITAL_COMMUNITY)
Admission: RE | Disposition: A | Discharge status: Home / Self Care | Payer: Self-pay | Source: Home / Self Care | Attending: Urology

## 2020-02-26 ENCOUNTER — Ambulatory Visit (HOSPITAL_COMMUNITY): Payer: Medicare Other | Admitting: Certified Registered Nurse Anesthetist

## 2020-02-26 ENCOUNTER — Encounter (HOSPITAL_COMMUNITY): Payer: Self-pay | Admitting: Urology

## 2020-02-26 DIAGNOSIS — R310 Gross hematuria: Secondary | ICD-10-CM | POA: Diagnosis not present

## 2020-02-26 DIAGNOSIS — Z85820 Personal history of malignant melanoma of skin: Secondary | ICD-10-CM | POA: Insufficient documentation

## 2020-02-26 DIAGNOSIS — Z87891 Personal history of nicotine dependence: Secondary | ICD-10-CM | POA: Insufficient documentation

## 2020-02-26 DIAGNOSIS — N3281 Overactive bladder: Secondary | ICD-10-CM | POA: Diagnosis not present

## 2020-02-26 DIAGNOSIS — Z888 Allergy status to other drugs, medicaments and biological substances status: Secondary | ICD-10-CM | POA: Diagnosis not present

## 2020-02-26 DIAGNOSIS — Z90721 Acquired absence of ovaries, unilateral: Secondary | ICD-10-CM | POA: Insufficient documentation

## 2020-02-26 DIAGNOSIS — M810 Age-related osteoporosis without current pathological fracture: Secondary | ICD-10-CM | POA: Diagnosis not present

## 2020-02-26 DIAGNOSIS — M1990 Unspecified osteoarthritis, unspecified site: Secondary | ICD-10-CM | POA: Diagnosis not present

## 2020-02-26 DIAGNOSIS — Z8249 Family history of ischemic heart disease and other diseases of the circulatory system: Secondary | ICD-10-CM | POA: Insufficient documentation

## 2020-02-26 DIAGNOSIS — I252 Old myocardial infarction: Secondary | ICD-10-CM | POA: Insufficient documentation

## 2020-02-26 DIAGNOSIS — E785 Hyperlipidemia, unspecified: Secondary | ICD-10-CM | POA: Insufficient documentation

## 2020-02-26 DIAGNOSIS — Z86018 Personal history of other benign neoplasm: Secondary | ICD-10-CM | POA: Insufficient documentation

## 2020-02-26 DIAGNOSIS — E1122 Type 2 diabetes mellitus with diabetic chronic kidney disease: Secondary | ICD-10-CM | POA: Diagnosis not present

## 2020-02-26 DIAGNOSIS — G4733 Obstructive sleep apnea (adult) (pediatric): Secondary | ICD-10-CM | POA: Insufficient documentation

## 2020-02-26 DIAGNOSIS — H9193 Unspecified hearing loss, bilateral: Secondary | ICD-10-CM | POA: Insufficient documentation

## 2020-02-26 DIAGNOSIS — Z419 Encounter for procedure for purposes other than remedying health state, unspecified: Secondary | ICD-10-CM

## 2020-02-26 DIAGNOSIS — N189 Chronic kidney disease, unspecified: Secondary | ICD-10-CM | POA: Insufficient documentation

## 2020-02-26 DIAGNOSIS — Z833 Family history of diabetes mellitus: Secondary | ICD-10-CM | POA: Insufficient documentation

## 2020-02-26 DIAGNOSIS — I129 Hypertensive chronic kidney disease with stage 1 through stage 4 chronic kidney disease, or unspecified chronic kidney disease: Secondary | ICD-10-CM | POA: Insufficient documentation

## 2020-02-26 DIAGNOSIS — J449 Chronic obstructive pulmonary disease, unspecified: Secondary | ICD-10-CM | POA: Diagnosis not present

## 2020-02-26 DIAGNOSIS — Z841 Family history of disorders of kidney and ureter: Secondary | ICD-10-CM | POA: Insufficient documentation

## 2020-02-26 DIAGNOSIS — Z8551 Personal history of malignant neoplasm of bladder: Secondary | ICD-10-CM | POA: Insufficient documentation

## 2020-02-26 DIAGNOSIS — I2510 Atherosclerotic heart disease of native coronary artery without angina pectoris: Secondary | ICD-10-CM | POA: Insufficient documentation

## 2020-02-26 DIAGNOSIS — Z8042 Family history of malignant neoplasm of prostate: Secondary | ICD-10-CM | POA: Insufficient documentation

## 2020-02-26 DIAGNOSIS — I779 Disorder of arteries and arterioles, unspecified: Secondary | ICD-10-CM | POA: Diagnosis not present

## 2020-02-26 DIAGNOSIS — K219 Gastro-esophageal reflux disease without esophagitis: Secondary | ICD-10-CM | POA: Diagnosis not present

## 2020-02-26 DIAGNOSIS — Z885 Allergy status to narcotic agent status: Secondary | ICD-10-CM | POA: Insufficient documentation

## 2020-02-26 DIAGNOSIS — M419 Scoliosis, unspecified: Secondary | ICD-10-CM | POA: Diagnosis not present

## 2020-02-26 DIAGNOSIS — I422 Other hypertrophic cardiomyopathy: Secondary | ICD-10-CM | POA: Insufficient documentation

## 2020-02-26 DIAGNOSIS — C679 Malignant neoplasm of bladder, unspecified: Secondary | ICD-10-CM | POA: Diagnosis present

## 2020-02-26 DIAGNOSIS — Z955 Presence of coronary angioplasty implant and graft: Secondary | ICD-10-CM | POA: Insufficient documentation

## 2020-02-26 HISTORY — PX: CYSTOSCOPY WITH FULGERATION: SHX6638

## 2020-02-26 SURGERY — CYSTOSCOPY, WITH BLADDER FULGURATION
Anesthesia: General | Site: Bladder

## 2020-02-26 MED ORDER — ORAL CARE MOUTH RINSE: 15.0000 mL | Freq: Once | OROMUCOSAL | Status: AC

## 2020-02-26 MED ORDER — PROPOFOL 10 MG/ML IV BOLUS
INTRAVENOUS | Status: AC
  Filled 2020-02-26: qty 20

## 2020-02-26 MED ORDER — ROCURONIUM BROMIDE 10 MG/ML (PF) SYRINGE
PREFILLED_SYRINGE | INTRAVENOUS | Status: DC | PRN
  Administered 2020-02-26: 10 mg via INTRAVENOUS
  Administered 2020-02-26: 30 mg via INTRAVENOUS

## 2020-02-26 MED ORDER — FENTANYL CITRATE (PF) 100 MCG/2ML IJ SOLN
INTRAMUSCULAR | Status: AC
Start: 2020-02-26 — End: 2020-02-26
  Administered 2020-02-26: 25 ug via INTRAVENOUS
  Filled 2020-02-26: qty 2

## 2020-02-26 MED ORDER — EPHEDRINE 5 MG/ML INJ
INTRAVENOUS | Status: AC
  Filled 2020-02-26: qty 10

## 2020-02-26 MED ORDER — DEXAMETHASONE SODIUM PHOSPHATE 10 MG/ML IJ SOLN
INTRAMUSCULAR | Status: AC
  Filled 2020-02-26: qty 1

## 2020-02-26 MED ORDER — ONDANSETRON HCL 4 MG/2ML IJ SOLN
INTRAMUSCULAR | Status: AC
  Filled 2020-02-26: qty 2

## 2020-02-26 MED ORDER — PHENYLEPHRINE HCL (PRESSORS) 10 MG/ML IV SOLN
INTRAVENOUS | Status: AC
  Filled 2020-02-26: qty 2

## 2020-02-26 MED ORDER — TRAMADOL HCL 50 MG PO TABS
50.0000 mg | ORAL_TABLET | Freq: Four times a day (QID) | ORAL | 0 refills | Status: DC | PRN
Start: 2020-02-26 — End: 2023-05-23

## 2020-02-26 MED ORDER — FENTANYL CITRATE (PF) 100 MCG/2ML IJ SOLN
INTRAMUSCULAR | Status: DC | PRN
Start: 2020-02-26 — End: 2020-02-26
  Administered 2020-02-26: 25 ug via INTRAVENOUS
  Administered 2020-02-26: 50 ug via INTRAVENOUS

## 2020-02-26 MED ORDER — GENTAMICIN SULFATE 40 MG/ML IJ SOLN: 5.0000 mg/kg | INTRAVENOUS | Status: DC

## 2020-02-26 MED ORDER — LACTATED RINGERS IV SOLN: INTRAVENOUS | Status: DC

## 2020-02-26 MED ORDER — ONDANSETRON HCL 4 MG/2ML IJ SOLN
INTRAMUSCULAR | Status: DC | PRN
  Administered 2020-02-26: 4 mg via INTRAVENOUS

## 2020-02-26 MED ORDER — SUGAMMADEX SODIUM 200 MG/2ML IV SOLN
INTRAVENOUS | Status: DC | PRN
  Administered 2020-02-26: 150 mg via INTRAVENOUS

## 2020-02-26 MED ORDER — PROPOFOL 10 MG/ML IV BOLUS
INTRAVENOUS | Status: AC
  Filled 2020-02-26: qty 40

## 2020-02-26 MED ORDER — IOHEXOL 300 MG/ML  SOLN
INTRAMUSCULAR | Status: DC | PRN
  Administered 2020-02-26: 8 mL

## 2020-02-26 MED ORDER — CHLORHEXIDINE GLUCONATE 0.12 % MT SOLN
15.0000 mL | Freq: Once | OROMUCOSAL | Status: AC
  Administered 2020-02-26: 15 mL via OROMUCOSAL

## 2020-02-26 MED ORDER — FENTANYL CITRATE (PF) 100 MCG/2ML IJ SOLN
25.0000 ug | INTRAMUSCULAR | Status: DC | PRN
  Administered 2020-02-26 (×2): 25 ug via INTRAVENOUS

## 2020-02-26 MED ORDER — PHENYLEPHRINE 40 MCG/ML (10ML) SYRINGE FOR IV PUSH (FOR BLOOD PRESSURE SUPPORT)
PREFILLED_SYRINGE | INTRAVENOUS | Status: DC | PRN
  Administered 2020-02-26: 80 ug via INTRAVENOUS
  Administered 2020-02-26: 40 ug via INTRAVENOUS

## 2020-02-26 MED ORDER — PHENYLEPHRINE 40 MCG/ML (10ML) SYRINGE FOR IV PUSH (FOR BLOOD PRESSURE SUPPORT)
PREFILLED_SYRINGE | INTRAVENOUS | Status: AC
  Filled 2020-02-26: qty 10

## 2020-02-26 MED ORDER — DEXAMETHASONE SODIUM PHOSPHATE 10 MG/ML IJ SOLN
INTRAMUSCULAR | Status: DC | PRN
  Administered 2020-02-26: 5 mg via INTRAVENOUS

## 2020-02-26 MED ORDER — LIDOCAINE 2% (20 MG/ML) 5 ML SYRINGE
INTRAMUSCULAR | Status: DC | PRN
  Administered 2020-02-26: 60 mg via INTRAVENOUS

## 2020-02-26 MED ORDER — OXYCODONE HCL 5 MG PO TABS: 5.0000 mg | ORAL_TABLET | Freq: Once | ORAL | Status: DC | PRN

## 2020-02-26 MED ORDER — CEFAZOLIN SODIUM-DEXTROSE 2-4 GM/100ML-% IV SOLN
2.0000 g | Freq: Once | INTRAVENOUS | Status: AC
  Administered 2020-02-26: 2 g via INTRAVENOUS
  Filled 2020-02-26: qty 100

## 2020-02-26 MED ORDER — PROPOFOL 10 MG/ML IV BOLUS
INTRAVENOUS | Status: DC | PRN
  Administered 2020-02-26 (×6): 20 mg via INTRAVENOUS
  Administered 2020-02-26: 70 mg via INTRAVENOUS
  Administered 2020-02-26 (×2): 20 mg via INTRAVENOUS
  Administered 2020-02-26: 10 mg via INTRAVENOUS

## 2020-02-26 MED ORDER — FENTANYL CITRATE (PF) 100 MCG/2ML IJ SOLN
INTRAMUSCULAR | Status: AC
  Filled 2020-02-26: qty 2

## 2020-02-26 MED ORDER — ACETAMINOPHEN 500 MG PO TABS
1000.0000 mg | ORAL_TABLET | Freq: Once | ORAL | Status: AC
  Administered 2020-02-26: 1000 mg via ORAL
  Filled 2020-02-26: qty 2

## 2020-02-26 MED ORDER — SUCCINYLCHOLINE CHLORIDE 200 MG/10ML IV SOSY
PREFILLED_SYRINGE | INTRAVENOUS | Status: DC | PRN
  Administered 2020-02-26: 80 mg via INTRAVENOUS

## 2020-02-26 MED ORDER — OXYCODONE HCL 5 MG/5ML PO SOLN: 5.0000 mg | Freq: Once | ORAL | Status: DC | PRN

## 2020-02-26 MED ORDER — SODIUM CHLORIDE 0.9 % IR SOLN
Status: DC | PRN
  Administered 2020-02-26: 9000 mL via INTRAVESICAL

## 2020-02-26 MED ORDER — LIDOCAINE 2% (20 MG/ML) 5 ML SYRINGE
INTRAMUSCULAR | Status: AC
  Filled 2020-02-26: qty 5

## 2020-02-26 MED ORDER — ETOMIDATE 2 MG/ML IV SOLN
INTRAVENOUS | Status: AC
  Filled 2020-02-26: qty 10

## 2020-02-26 SURGICAL SUPPLY — 19 items
BAG DRN RND TRDRP ANRFLXCHMBR (UROLOGICAL SUPPLIES)
BAG URINE DRAIN 2000ML AR STRL (UROLOGICAL SUPPLIES) IMPLANT
BAG URO CATCHER STRL LF (MISCELLANEOUS) ×3 IMPLANT
CATH INTERMIT  6FR 70CM (CATHETERS) ×3 IMPLANT
ELECT REM PT RETURN 15FT ADLT (MISCELLANEOUS) ×3 IMPLANT
EVACUATOR MICROVAS BLADDER (UROLOGICAL SUPPLIES) IMPLANT
GLOVE BIOGEL M STRL SZ7.5 (GLOVE) ×3 IMPLANT
GOWN STRL REUS W/TWL LRG LVL3 (GOWN DISPOSABLE) ×3 IMPLANT
GUIDEWIRE STR DUAL SENSOR (WIRE) ×3 IMPLANT
KIT TURNOVER KIT A (KITS) IMPLANT
LOOP CUT BIPOLAR 24F LRG (ELECTROSURGICAL) ×3 IMPLANT
MANIFOLD NEPTUNE II (INSTRUMENTS) ×3 IMPLANT
PACK CYSTO (CUSTOM PROCEDURE TRAY) ×3 IMPLANT
PENCIL SMOKE EVACUATOR (MISCELLANEOUS) IMPLANT
SYR TOOMEY IRRIG 70ML (MISCELLANEOUS)
SYRINGE TOOMEY IRRIG 70ML (MISCELLANEOUS) IMPLANT
TUBING CONNECTING 10 (TUBING) ×2 IMPLANT
TUBING CONNECTING 10' (TUBING) ×1
TUBING UROLOGY SET (TUBING) ×3 IMPLANT

## 2020-02-26 NOTE — Discharge Instructions (Signed)
1 - You may have urinary urgency (bladder spasms) and bloody urine on / off. This is normal.  2 - Call MD or go to ER for fever >102, severe pain / nausea / vomiting not relieved by medications, or acute change in medical status  

## 2020-02-26 NOTE — Anesthesia Procedure Notes (Signed)
Procedure Name: Intubation Date/Time: 02/26/2020 11:54 AM Performed by: Brennan Bailey, MD Pre-anesthesia Checklist: Patient identified, Emergency Drugs available, Suction available and Patient being monitored Patient Re-evaluated:Patient Re-evaluated prior to induction Oxygen Delivery Method: Circle System Utilized Preoxygenation: Pre-oxygenation with 100% oxygen Induction Type: IV induction Ventilation: Mask ventilation without difficulty Grade View: Grade III Tube type: Oral Tube size: 5.5 mm Number of attempts: 5 or more Airway Equipment and Method: Fiberoptic brochoscope Placement Confirmation: ETT inserted through vocal cords under direct vision,  positive ETCO2 and breath sounds checked- equal and bilateral Secured at: 21 cm Tube secured with: Tape Dental Injury: Teeth and Oropharynx as per pre-operative assessment  Difficulty Due To: Difficulty was unanticipated and Difficult Airway- due to anterior larynx Future Recommendations: Recommend- induction with short-acting agent, and alternative techniques readily available Comments: Initial attempt with LMA #4 then #3 with poor seal/significant air leak despite attempts to reposition. Elected to place ETT. Easy mask ventilation. Paralyzed and initial view by CRNA with Mac3 blade was reported to be grade 3. View by myself with Sabra Heck 3 blade was grade 3 with cricoid pressure, anterior larynx noted. Glidescope #3 blade with excellent view of glottis but unable to advance 7.0 ETT past cords without resistance just distal to cords. 5.5 ETT with same resistance encountered. Elected for final attempt to use fiberoptic scope to visualize subglottis, no significant stenosis or lesions observed, 5.5 ETT advanced over fiberoptic scope with no difficulty. Suspect anterior course of trachea was reason for resistance of ETT despite twisting maneuvers of ETT. In future would recommend Glidescope intubation with fiberoptic scope available if needed and  smaller ETT. No trauma observed to airway during airway placement attempts. Mask ventilated throughout with SpO2 >95%. VSS. +BBS with 5.5 ETT in place. Erasmo Downer, MD

## 2020-02-26 NOTE — Brief Op Note (Signed)
02/26/2020  12:37 PM  PATIENT:  Victoria Short  86 y.o. female  PRE-OPERATIVE DIAGNOSIS:  GROSS HEMATURIA, BLADDER CANCER  POST-OPERATIVE DIAGNOSIS:  GROSS HEMATURIA, BLADDER CANCER  PROCEDURE:  Procedure(s) with comments: CYSTOSCOPY LEFT RETROGRADE  CLOT EVACUATION WITH RESECTION OF TUMOR (N/A) - 1 HR  SURGEON:  Surgeon(s) and Role:    * Alexis Frock, MD - Primary  PHYSICIAN ASSISTANT:   ASSISTANTS: none   ANESTHESIA:   general  EBL:  56mL new blood, 166mL old formed clot   BLOOD ADMINISTERED:none  DRAINS: none   LOCAL MEDICATIONS USED:  NONE  SPECIMEN:  Source of Specimen:  clot and necrotic bladder tumor  DISPOSITION OF SPECIMEN:  discard  COUNTS:  YES  TOURNIQUET:  * No tourniquets in log *  DICTATION: .Other Dictation: Dictation Number [id number]  PLAN OF CARE: Discharge to home after PACU  PATIENT DISPOSITION:  PACU - hemodynamically stable.   Delay start of Pharmacological VTE agent (>24hrs) due to surgical blood loss or risk of bleeding: yes

## 2020-02-26 NOTE — H&P (Signed)
Victoria Short is an 86 y.o. female.    Chief Complaint: Pre-OP Cysto, Retrogrades, Palliative TURBT / Fulgeration  HPI:   1 - Muscle Invasive Bladder Cancer, Recurrent Hematuria- T2G3 cancer DX [place] 2019, treated eith TURBT and primary radiation (did not tollerate chemo, non-curative intent).   Recent Course: 10/2019 - CT - progression of pelvic disease, likely T3 at least 11/2019 - Operative cysto / fulgeration for local recurrence  PMH sig for CAD/Stent.  Today "Victoria Short" is seen to proceed with palliative transurethral resection of bladder cancer for reurrent bleedgin tumor. Hgb 9.9. C19 screen negative. Cr 0.67. Most recent UA withotu significant infectious parameters.    Past Medical History:  Diagnosis Date  . Arthritis    hands  . Benign neoplasm of pituitary gland (HCC)    per last mri 01-30-2015  slight decrease macro adenoma  . Bladder cancer ([place])   . Bladder tumor   . Borderline diabetes mellitus   . Cardiogenic shock ([place])    a. 05/2011 requiring pressor therapy.  . Carotid artery disease ([place])    per last duplex in epic 05-06-2015   bilateral ICA 1-39%  . Chronic kidney disease   . Complication of anesthesia    prolonged sedation  . Constipation   . COPD (chronic obstructive pulmonary disease) ([facility])   . Coronary artery disease cardiologist-  dr Bronson Ing   a. NSTEMI 05/2011 s/p DES to LAD, with ?Takotsubo distribution of cardiomyopathy at that time with cardiogenic shock. b. NSTEMI 06/2011 ?spasm - stable CAD, normal EF. c. Patent LAD stent, nonobst CAD by cath 08/20/12 with normal EF. d. Hx dyspnea with Brilinta. e. patent stent by cath in 01/2015  . Dependence on nocturnal oxygen therapy 2020  . DOE (dyspnea on exertion)   . Dyspnea    with exertion and also noted some at rest  . Erosive gastritis   . Fatigue   . GERD (gastroesophageal reflux disease)   . Heart murmur   . Hematuria    passes blood clots occ  . Hemorrhoids   . Hiatal hernia   . History  of gastritis 05/2014   w/ erosions  . History of GI bleed 05/2015   s/p  EGD with ablation duodenal polyp and gastric polypectomy with post gi bleed , s/p clipping  . History of non-ST elevation myocardial infarction (NSTEMI) 05/11/2011   w/ cardiogenic shock requiring pressor therapy,  and s/p  PCI and DES to LAD  . History of shingles   . History of skin cancer   . HOH (hard of hearing)    bilateral wears hearing aides  . Hyperlipidemia   . Hypertension   . Hypertrophic cardiomyopathy ([place])    a. echo 08/2012: focal basal septal hypertrophy, mildly elevated gradient 46mmHg across LVOT.  . Iron deficiency anemia due to chronic blood loss    hemotologist --- dr Lauree Chandler @ [facility] office in [place],, hx IV iron infusions  . Melanoma ([place])    nose  . Mild obstructive sleep apnea    per pt no cpap recommendation,uses oxigen(3L) at night time  . Myocardial infarction ([place])   . Nasal polyp   . Neuropathy, peripheral   . Nocturia more than twice per night   . OAB (overactive bladder)   . Osteoporosis   . Pneumonia    childhood  . Polyp of stomach and duodenum   . Poor circulation    lower ext bil  . Pre-diabetes   . S/P drug eluting coronary stent placement  05/12/2011   to mid LAD  . Scoliosis   . Spider veins   . Stress-induced cardiomyopathy 05/2011   a. ?Takotubso cardiomyopathy - distribution was different than that of LAD stenosis.  Marland Kitchen Unsteady gait   . Urinary incontinence   . Vertigo, intermittent   . Vitamin D deficiency   . Wears hearing aid in both ears     Past Surgical History:  Procedure Laterality Date  . APPENDECTOMY  1953  . CARDIAC CATHETERIZATION  06/22/2011   patent LAD stent. Normal EF  . CARDIAC CATHETERIZATION  08/2012   Patent LAD stent with normal ejection fraction.  Marland Kitchen CARDIAC CATHETERIZATION N/A 01/19/2015   Procedure: Right/Left Heart Cath and Coronary Angiography;  Surgeon: Jettie Booze, MD;  Location: [facility];  Service:  Cardiovascular;  Laterality: N/A;  . CARDIOVASCULAR STRESS TEST  11-26-2014   dr Bronson Ing   Low risk nuclear study w/ no ischemia or infarct/  normal LV function and wall motion , ef 72%  . CATARACT EXTRACTION W/ INTRAOCULAR LENS  IMPLANT, BILATERAL  2009  approx.  Marland Kitchen CATARACT EXTRACTION, BILATERAL    . COLONOSCOPY    . CORONARY ANGIOPLASTY WITH STENT PLACEMENT  05-12-2011  dr Shelva Majestic   mid LAD: 2.5 X 16 mm Promus DES for 85-90% stenosis/  pCFx 30-40%, diagonal 20%, ef 40-45%/ ascending aorta stenosis 11mm gradient/  hypocontratility involve distal anterolateral apical and distal inferior wall  . CYSTOSCOPY N/A 06/10/2019   Procedure: CYSTOSCOPY WITH CLOT EVACUATION;  Surgeon: Kathie Rhodes, MD;  Location: [facility];  Service: Urology;  Laterality: N/A;  . ESOPHAGOGASTRODUODENOSCOPY N/A 05/23/2014   Procedure: ESOPHAGOGASTRODUODENOSCOPY (EGD);  Surgeon: Rogene Houston, MD;  [facility];  Service: Endoscopy;  Laterality: N/A;  230 - moved to 1:05 - Ann notified pt  . ESOPHAGOGASTRODUODENOSCOPY N/A 05/20/2015   Procedure: ESOPHAGOGASTRODUODENOSCOPY (EGD);  Surgeon: Rogene Houston, MD;  [facility];  Service: Endoscopy;  Laterality: N/A;  1:00  . ESOPHAGOGASTRODUODENOSCOPY N/A 05/28/2015   Procedure: ESOPHAGOGASTRODUODENOSCOPY (EGD);  Surgeon: Rogene Houston, MD;  [facility];  Service: Endoscopy;  Laterality: N/A;  . GIVENS CAPSULE STUDY N/A 05/12/2014   Procedure: GIVENS CAPSULE STUDY;  Surgeon: Rogene Houston, MD;  [facility];  Service: Endoscopy;  Laterality: N/A;  730  . LEFT HEART CATHETERIZATION WITH CORONARY ANGIOGRAM N/A 05/12/2011   Procedure: LEFT HEART CATHETERIZATION WITH CORONARY ANGIOGRAM;  Surgeon: Troy Sine, MD;  Location: [facility] C[facility] LAB;  Service: Cardiovascular;  Laterality: N/A;  . LEFT HEART CATHETERIZATION WITH CORONARY ANGIOGRAM N/A 06/22/2011   Procedure: LEFT HEART CATHETERIZATION WITH CORONARY ANGIOGRAM;  Surgeon:  Wellington Hampshire, MD;  Location: [facility];  Service: Cardiovascular;  Laterality: N/A;  . LEFT HEART CATHETERIZATION WITH CORONARY ANGIOGRAM N/A 08/20/2012   Procedure: LEFT HEART CATHETERIZATION WITH CORONARY ANGIOGRAM;  Surgeon: Burnell Blanks, MD;  Location: [facility] CATH LAB;  Service: Cardiovascular;  Laterality: N/A;  . NASAL POLYP EXCISION    . PARTIAL OOPHORECTOMY Right 1955   BENIGN  . PERCUTANEOUS CORONARY STENT INTERVENTION (PCI-S)  05/12/2011   Procedure: PERCUTANEOUS CORONARY STENT INTERVENTION (PCI-S);  Surgeon: Troy Sine, MD;  Location: [facility] CATH LAB;  Service: Cardiovascular;;  . POLYPECTOMY N/A 05/20/2015   Procedure: EGD with polypectomy;  Surgeon: Rogene Houston, MD;  [facility];  Service: Endoscopy;  Laterality: N/A;  . TRANSTHORACIC ECHOCARDIOGRAM  10-09-2017   dr Bronson Ing   ef 55-60%, moderate conentric LVH,  grade 2 diastolic dysfunction/  basal septal hypertrophy with systolic anterior motion of the mitral valve outflow track peak grandient greater than 146mmHg with valsalva/  mild AR, MR, and TR  . TRANSURETHRAL RESECTION OF BLADDER TUMOR N/A 04/30/2018   Procedure: TRANSURETHRAL RESECTION OF BLADDER TUMOR (TURBT);  Surgeon: Kathie Rhodes, MD;  [facility];  Service: Urology;  Laterality: N/A;  . TRANSURETHRAL RESECTION OF BLADDER TUMOR N/A 06/10/2019   Procedure: TRANSURETHRAL RESECTION OF BLADDER TUMOR (TURBT) VERSUS FULGURATION;  Surgeon: Kathie Rhodes, MD;  Location: [facility];  Service: Urology;  Laterality: N/A;  . TRANSURETHRAL RESECTION OF BLADDER TUMOR N/A 11/11/2019   Procedure: TRANSURETHRAL RESECTION OF BLADDER TUMOR (TURBT) and FULGURATION/ CYSTOSCOPY WITH  POSSIBLE CLOT EVACUATION;  Surgeon: Kathie Rhodes, MD;  Location: [facility];  Service: Urology;  Laterality: N/A;  . WISDOM TOOTH EXTRACTION      Family History  Problem Relation Age of Onset  . Heart attack Mother 46  . Diabetic kidney disease Father 42  . Prostate cancer Father     Social History:  reports that she quit smoking about 40 years ago. Her smoking use included cigarettes. She has a 12.00 pack-year smoking history. She has never used smokeless tobacco. She reports previous alcohol use. She reports that she does not use drugs.  Allergies:  Allergies  Allergen Reactions  . Brilinta [Ticagrelor] Shortness Of Breath, Nausea And Vomiting and Other (See Comments)    dyspnea  . Iodine Nausea And Vomiting and Other (See Comments)    AND DIZZY  . Metoprolol Other (See Comments)    Bradycardia  . Shellfish Allergy Nausea And Vomiting and Other (See Comments)    Unsteady gait/  PER PT ONLY SHRIMP  . Streptomycin Nausea And Vomiting  . Tramadol Other (See Comments)    Altered mood  . Iodinated Diagnostic Agents Nausea Only    No medications prior to admission.    Results for orders placed or performed during the hospital encounter of 02/25/20 (from the past 48 hour(s))  SARS CORONAVIRUS 2 (TAT 6-24 HRS) Nasopharyngeal Nasopharyngeal Swab     Status: None   Collection Time: 02/25/20  9:32 AM   Specimen: Nasopharyngeal Swab  Result Value Ref Range   SARS Coronavirus 2 NEGATIVE NEGATIVE    Comment: (NOTE) SARS-CoV-2 target nucleic acids are NOT DETECTED.  The SARS-CoV-2 RNA is generally detectable in upper and lower respiratory specimens during the acute phase of infection. Negative results do not preclude SARS-CoV-2 infection, do not rule out co-infections with other pathogens, and should not be used as the sole basis for treatment or other patient management decisions. Negative results must be combined with clinical observations, patient history, and epidemiological information. The expected result is Negative.  Fact Sheet for Patients: SugarRoll.be  Fact Sheet for Healthcare Providers: https://www.woods-mathews.com/  This test is not yet approved or cleared by the [place] FDA and  has been  authorized for detection and/or diagnosis of SARS-CoV-2 by FDA under an Emergency Use Authorization (EUA). This EUA will remain  in effect (meaning this test can be used) for the duration of the COVID-19 declaration under Se ction 564(b)(1) of the Act, 21 U.S.C. section 360bbb-3(b)(1), unless the authorization is terminated or revoked sooner.  Performed at [facility], [place] [address] [place], [place] [address]    No results found.  Review of Systems  Constitutional: Negative for chills and fever.  All other systems reviewed and are negative.   There were no vitals taken for  this visit. Physical Exam Vitals reviewed.  HENT:     Head: Normocephalic.     Nose: Nose normal.  Eyes:     Pupils: Pupils are equal, round, and reactive to light.  Cardiovascular:     Rate and Rhythm: Normal rate.  Pulmonary:     Effort: Pulmonary effort is normal.  Abdominal:     General: Abdomen is flat.  Genitourinary:    Comments: No CVAT at present Musculoskeletal:     Cervical back: Normal range of motion.  Skin:    General: Skin is warm.  Neurological:     General: No focal deficit present.     Mental Status: She is alert.  Psychiatric:        Mood and Affect: Mood normal.      Assessment/Plan  Proceed as planned with PALLIATIVE transurethral resection of bladder tumor, retrogrades, fulguration with goal of reduce hematuria. Risks, benefits, alternatives, expected peri-op course and FRANK discussion that she is at increased risk of ALL complications including major CV complications and death given her advanced age and CV comorbidity.   Alexis Frock, MD 02/26/2020, 6:47 AM

## 2020-02-26 NOTE — Anesthesia Postprocedure Evaluation (Signed)
Anesthesia Post Note  Patient: Victoria Short  Procedure(s) Performed: CYSTOSCOPY LEFT RETROGRADE  CLOT EVACUATION WITH RESECTION OF TUMOR (N/A Bladder)     Patient location during evaluation: PACU Anesthesia Type: General Level of consciousness: awake and alert and oriented Pain management: pain level controlled Vital Signs Assessment: post-procedure vital signs reviewed and stable Respiratory status: spontaneous breathing, nonlabored ventilation and respiratory function stable Cardiovascular status: blood pressure returned to baseline Postop Assessment: no apparent nausea or vomiting Anesthetic complications: no   No complications documented.  Last Vitals:  Vitals:   02/26/20 1330 02/26/20 1345  BP: (!) 171/76 (!) 168/71  Pulse: 67 64  Resp: 18 15  Temp:    SpO2: 100% 99%    Last Pain:  Vitals:   02/26/20 1253  TempSrc:   PainSc: [place]

## 2020-02-26 NOTE — Transfer of Care (Signed)
Immediate Anesthesia Transfer of Care Note  Patient: Victoria Short  Procedure(s) Performed: CYSTOSCOPY LEFT RETROGRADE  CLOT EVACUATION WITH RESECTION OF TUMOR (N/A Bladder)  Patient Location: PACU  Anesthesia Type:General  Level of Consciousness: awake, oriented, patient cooperative and responds to stimulation  Airway & Oxygen Therapy: Patient Spontanous Breathing and Patient connected to face mask oxygen  Post-op Assessment: Report given to RN and Post -op Vital signs reviewed and stable  Post vital signs: Reviewed and stable  Last Vitals:  Vitals Value Taken Time  BP 173/83 02/26/20 1317  Temp 36.3 C 02/26/20 1253  Pulse 68 02/26/20 1320  Resp 18 02/26/20 1320  SpO2 98 % 02/26/20 1320  Vitals shown include unvalidated device data.  Last Pain:  Vitals:   02/26/20 1253  TempSrc:   PainSc: Asleep         Complications: No complications documented.

## 2020-02-27 ENCOUNTER — Encounter (HOSPITAL_COMMUNITY): Payer: Self-pay | Admitting: Urology

## 2020-02-27 NOTE — Op Note (Signed)
NAMEKAEDE, CLENDENEN MEDICAL RECORD [id number] ACCOUNT [id number] DATE OF BIRTH:[birth year] FACILITY: [facility] LOCATION: [facility] PHYSICIAN:Casin Federici Tresa Moore, MD  OPERATIVE REPORT  DATE OF PROCEDURE:  02/26/2020  SURGEON:  Alexis Frock, MD  PREOPERATIVE DIAGNOSIS:  Muscle invasive bladder cancer with recurrence and gross hematuria.  PROCEDURE: 1.  Cystoscopy, left retrograde pyelogram, interpretation. 2.  Transection of bladder tumor, large. 3.  Clot evacuation, fulguration of bleeders.  ESTIMATED BLOOD LOSS:  50 mL.  COMPLICATIONS:  None.  SPECIMENS:  Bladder tumor necrotic and clot for discard.  FINDINGS: 1.  Massive volume recurrent bladder cancer obliterating right wall, right ureteral orifice.  Estimated total surface area 10 cm2. 2.  Removal of approximately 100 mL of formed clot. 3.  Obliteration of right ureteral orifice. 4.  Unremarkable left retrograde pyelogram. 5.  Complete resolution of any active bleeding following evacuation, fulguration of bleeders.  INDICATIONS:  The patient is an unfortunate 86 year old lady with history of muscle invasive bladder cancer.  She has been on a noncurative intent path.  She is status post radiation for this several years ago.  She has had several recurrences since,  symptomatic hematuria, fortunately not hemodynamically significant.  She was found on workup of recurrent gross hematuria with clots to have a likely recurrent tumor.  Options were discussed, including hospice and palliative transition versus  repeat palliative endoscopic exam and resection and she wished to proceed with the latter.  Informed consent was obtained and placed in the medical record.  DESCRIPTION OF PROCEDURE:  The patient being identified, the procedure being resection of bladder tumor, cystoscopy with clot evacuation and retrograde was confirmed.  Procedure timeout was performed.  Intravenous antibiotics administered.  General  endotracheal anesthesia  induced.  The patient was placed into a low lithotomy position.  Sterile field was created, prepping and draping the vagina, introitus and proximal thighs using iodine.  Cystourethroscopy was initially performed using 21-French  rigid cystoscope with offset lens.  Inspection of the bladder revealed a massive volume of necrotic tumor, completely obliterating the right wall of the bladder.  Estimated surface area 10 cm2.  There was some active bleeding and some active papillary  tissue at the edges of this.  The right hemitrigone and ureteral orifice was not visible.  The left ureteral orifice was visible.  The left orifice cannulated with a 6-French renal catheter and left retrograde pyelogram was obtained.  Left retrograde pyelogram demonstrated a single left ureter with single system left kidney.  No filling defects or narrowing noted.  As the patient's renal function is normal, heroic attempts were not made at attempting to unroof the right ureteral  orifice.  Cystoscope was then exchanged for the 26-French resectoscope sheath with visual obturator and using medium size resectoscope loop, very careful resection was performed down to superficial fibromuscular stroma of the urinary bladder involving  the area of necrotic tissue and active tumor.  Total surface area approximately 10 cm2.  The necrotic and tumor tissue was irrigated and set aside for discard.  The base of this area and edges was fulgurated using coagulation current and high energy  level in attempt to achieve excellent hemostasis as the goals of today's procedure are completely palliative.  Following this, excellent hemostasis, no evidence of bladder perforation.  The right ureteral orifice again remained completely obliterated.   The left ureteral orifice was visually patent.  Bladder was partially empty per cystoscope.  Procedure was then terminated.  The patient tolerated the procedure well.  No immediate perioperative complications.  The  patient was taken to postanesthesia  care unit in stable condition.  Plan for discharge home.  We will discuss frankly with the patient and her family her rapidly recurrent tumor and recommended path of likely palliative or hospice transition in the near future.  VN/NUANCE  D:02/26/2020 T:02/26/2020 JOB:[id number]

## 2020-03-19 DIAGNOSIS — C679 Malignant neoplasm of bladder, unspecified: Secondary | ICD-10-CM | POA: Diagnosis not present

## 2020-03-19 DIAGNOSIS — I252 Old myocardial infarction: Secondary | ICD-10-CM | POA: Diagnosis not present

## 2020-03-19 DIAGNOSIS — Z87891 Personal history of nicotine dependence: Secondary | ICD-10-CM | POA: Diagnosis not present

## 2020-03-19 DIAGNOSIS — I2510 Atherosclerotic heart disease of native coronary artery without angina pectoris: Secondary | ICD-10-CM | POA: Diagnosis not present

## 2020-03-20 DIAGNOSIS — I2510 Atherosclerotic heart disease of native coronary artery without angina pectoris: Secondary | ICD-10-CM | POA: Diagnosis not present

## 2020-03-20 DIAGNOSIS — Z87891 Personal history of nicotine dependence: Secondary | ICD-10-CM | POA: Diagnosis not present

## 2020-03-20 DIAGNOSIS — C679 Malignant neoplasm of bladder, unspecified: Secondary | ICD-10-CM | POA: Diagnosis not present

## 2020-03-20 DIAGNOSIS — I252 Old myocardial infarction: Secondary | ICD-10-CM | POA: Diagnosis not present

## 2020-03-24 DIAGNOSIS — I2510 Atherosclerotic heart disease of native coronary artery without angina pectoris: Secondary | ICD-10-CM | POA: Diagnosis not present

## 2020-03-24 DIAGNOSIS — C679 Malignant neoplasm of bladder, unspecified: Secondary | ICD-10-CM | POA: Diagnosis not present

## 2020-03-24 DIAGNOSIS — I252 Old myocardial infarction: Secondary | ICD-10-CM | POA: Diagnosis not present

## 2020-03-24 DIAGNOSIS — Z87891 Personal history of nicotine dependence: Secondary | ICD-10-CM | POA: Diagnosis not present

## 2020-03-25 DIAGNOSIS — C679 Malignant neoplasm of bladder, unspecified: Secondary | ICD-10-CM | POA: Diagnosis not present

## 2020-03-25 DIAGNOSIS — I252 Old myocardial infarction: Secondary | ICD-10-CM | POA: Diagnosis not present

## 2020-03-25 DIAGNOSIS — I2510 Atherosclerotic heart disease of native coronary artery without angina pectoris: Secondary | ICD-10-CM | POA: Diagnosis not present

## 2020-03-25 DIAGNOSIS — Z87891 Personal history of nicotine dependence: Secondary | ICD-10-CM | POA: Diagnosis not present

## 2020-03-30 DIAGNOSIS — I252 Old myocardial infarction: Secondary | ICD-10-CM | POA: Diagnosis not present

## 2020-03-30 DIAGNOSIS — Z87891 Personal history of nicotine dependence: Secondary | ICD-10-CM | POA: Diagnosis not present

## 2020-03-30 DIAGNOSIS — C679 Malignant neoplasm of bladder, unspecified: Secondary | ICD-10-CM | POA: Diagnosis not present

## 2020-03-30 DIAGNOSIS — I2510 Atherosclerotic heart disease of native coronary artery without angina pectoris: Secondary | ICD-10-CM | POA: Diagnosis not present

## 2020-03-31 DIAGNOSIS — I2510 Atherosclerotic heart disease of native coronary artery without angina pectoris: Secondary | ICD-10-CM | POA: Diagnosis not present

## 2020-03-31 DIAGNOSIS — Z87891 Personal history of nicotine dependence: Secondary | ICD-10-CM | POA: Diagnosis not present

## 2020-03-31 DIAGNOSIS — I252 Old myocardial infarction: Secondary | ICD-10-CM | POA: Diagnosis not present

## 2020-03-31 DIAGNOSIS — C679 Malignant neoplasm of bladder, unspecified: Secondary | ICD-10-CM | POA: Diagnosis not present

## 2020-04-01 DIAGNOSIS — Z87891 Personal history of nicotine dependence: Secondary | ICD-10-CM | POA: Diagnosis not present

## 2020-04-01 DIAGNOSIS — I252 Old myocardial infarction: Secondary | ICD-10-CM | POA: Diagnosis not present

## 2020-04-01 DIAGNOSIS — I2510 Atherosclerotic heart disease of native coronary artery without angina pectoris: Secondary | ICD-10-CM | POA: Diagnosis not present

## 2020-04-01 DIAGNOSIS — C679 Malignant neoplasm of bladder, unspecified: Secondary | ICD-10-CM | POA: Diagnosis not present

## 2020-04-03 DIAGNOSIS — I252 Old myocardial infarction: Secondary | ICD-10-CM | POA: Diagnosis not present

## 2020-04-03 DIAGNOSIS — Z87891 Personal history of nicotine dependence: Secondary | ICD-10-CM | POA: Diagnosis not present

## 2020-04-03 DIAGNOSIS — C679 Malignant neoplasm of bladder, unspecified: Secondary | ICD-10-CM | POA: Diagnosis not present

## 2020-04-03 DIAGNOSIS — I2510 Atherosclerotic heart disease of native coronary artery without angina pectoris: Secondary | ICD-10-CM | POA: Diagnosis not present

## 2020-04-07 DIAGNOSIS — Z87891 Personal history of nicotine dependence: Secondary | ICD-10-CM | POA: Diagnosis not present

## 2020-04-07 DIAGNOSIS — C679 Malignant neoplasm of bladder, unspecified: Secondary | ICD-10-CM | POA: Diagnosis not present

## 2020-04-07 DIAGNOSIS — I2510 Atherosclerotic heart disease of native coronary artery without angina pectoris: Secondary | ICD-10-CM | POA: Diagnosis not present

## 2020-04-07 DIAGNOSIS — I252 Old myocardial infarction: Secondary | ICD-10-CM | POA: Diagnosis not present

## 2020-04-08 DIAGNOSIS — Z87891 Personal history of nicotine dependence: Secondary | ICD-10-CM | POA: Diagnosis not present

## 2020-04-08 DIAGNOSIS — I2510 Atherosclerotic heart disease of native coronary artery without angina pectoris: Secondary | ICD-10-CM | POA: Diagnosis not present

## 2020-04-08 DIAGNOSIS — I252 Old myocardial infarction: Secondary | ICD-10-CM | POA: Diagnosis not present

## 2020-04-08 DIAGNOSIS — C679 Malignant neoplasm of bladder, unspecified: Secondary | ICD-10-CM | POA: Diagnosis not present

## 2020-04-09 DIAGNOSIS — I252 Old myocardial infarction: Secondary | ICD-10-CM | POA: Diagnosis not present

## 2020-04-09 DIAGNOSIS — Z87891 Personal history of nicotine dependence: Secondary | ICD-10-CM | POA: Diagnosis not present

## 2020-04-09 DIAGNOSIS — I2510 Atherosclerotic heart disease of native coronary artery without angina pectoris: Secondary | ICD-10-CM | POA: Diagnosis not present

## 2020-04-09 DIAGNOSIS — C679 Malignant neoplasm of bladder, unspecified: Secondary | ICD-10-CM | POA: Diagnosis not present

## 2020-04-14 DIAGNOSIS — Z87891 Personal history of nicotine dependence: Secondary | ICD-10-CM | POA: Diagnosis not present

## 2020-04-14 DIAGNOSIS — C679 Malignant neoplasm of bladder, unspecified: Secondary | ICD-10-CM | POA: Diagnosis not present

## 2020-04-14 DIAGNOSIS — I252 Old myocardial infarction: Secondary | ICD-10-CM | POA: Diagnosis not present

## 2020-04-14 DIAGNOSIS — I2510 Atherosclerotic heart disease of native coronary artery without angina pectoris: Secondary | ICD-10-CM | POA: Diagnosis not present

## 2020-04-15 DIAGNOSIS — C679 Malignant neoplasm of bladder, unspecified: Secondary | ICD-10-CM | POA: Diagnosis not present

## 2020-04-15 DIAGNOSIS — Z87891 Personal history of nicotine dependence: Secondary | ICD-10-CM | POA: Diagnosis not present

## 2020-04-15 DIAGNOSIS — I2510 Atherosclerotic heart disease of native coronary artery without angina pectoris: Secondary | ICD-10-CM | POA: Diagnosis not present

## 2020-04-15 DIAGNOSIS — I252 Old myocardial infarction: Secondary | ICD-10-CM | POA: Diagnosis not present

## 2020-04-16 DIAGNOSIS — I2510 Atherosclerotic heart disease of native coronary artery without angina pectoris: Secondary | ICD-10-CM | POA: Diagnosis not present

## 2020-04-16 DIAGNOSIS — Z87891 Personal history of nicotine dependence: Secondary | ICD-10-CM | POA: Diagnosis not present

## 2020-04-16 DIAGNOSIS — I252 Old myocardial infarction: Secondary | ICD-10-CM | POA: Diagnosis not present

## 2020-04-16 DIAGNOSIS — C679 Malignant neoplasm of bladder, unspecified: Secondary | ICD-10-CM | POA: Diagnosis not present

## 2020-04-21 ENCOUNTER — Ambulatory Visit: Payer: Medicare Other | Admitting: Cardiovascular Disease

## 2020-04-21 DIAGNOSIS — I252 Old myocardial infarction: Secondary | ICD-10-CM | POA: Diagnosis not present

## 2020-04-21 DIAGNOSIS — C679 Malignant neoplasm of bladder, unspecified: Secondary | ICD-10-CM | POA: Diagnosis not present

## 2020-04-21 DIAGNOSIS — Z87891 Personal history of nicotine dependence: Secondary | ICD-10-CM | POA: Diagnosis not present

## 2020-04-21 DIAGNOSIS — I2510 Atherosclerotic heart disease of native coronary artery without angina pectoris: Secondary | ICD-10-CM | POA: Diagnosis not present

## 2020-04-22 DIAGNOSIS — I2510 Atherosclerotic heart disease of native coronary artery without angina pectoris: Secondary | ICD-10-CM | POA: Diagnosis not present

## 2020-04-22 DIAGNOSIS — C679 Malignant neoplasm of bladder, unspecified: Secondary | ICD-10-CM | POA: Diagnosis not present

## 2020-04-22 DIAGNOSIS — I252 Old myocardial infarction: Secondary | ICD-10-CM | POA: Diagnosis not present

## 2020-04-22 DIAGNOSIS — Z87891 Personal history of nicotine dependence: Secondary | ICD-10-CM | POA: Diagnosis not present

## 2020-04-23 DIAGNOSIS — I252 Old myocardial infarction: Secondary | ICD-10-CM | POA: Diagnosis not present

## 2020-04-23 DIAGNOSIS — C679 Malignant neoplasm of bladder, unspecified: Secondary | ICD-10-CM | POA: Diagnosis not present

## 2020-04-23 DIAGNOSIS — I2510 Atherosclerotic heart disease of native coronary artery without angina pectoris: Secondary | ICD-10-CM | POA: Diagnosis not present

## 2020-04-23 DIAGNOSIS — Z87891 Personal history of nicotine dependence: Secondary | ICD-10-CM | POA: Diagnosis not present

## 2020-04-24 DIAGNOSIS — Z87891 Personal history of nicotine dependence: Secondary | ICD-10-CM | POA: Diagnosis not present

## 2020-04-24 DIAGNOSIS — I252 Old myocardial infarction: Secondary | ICD-10-CM | POA: Diagnosis not present

## 2020-04-24 DIAGNOSIS — C679 Malignant neoplasm of bladder, unspecified: Secondary | ICD-10-CM | POA: Diagnosis not present

## 2020-04-24 DIAGNOSIS — I2510 Atherosclerotic heart disease of native coronary artery without angina pectoris: Secondary | ICD-10-CM | POA: Diagnosis not present

## 2020-04-27 ENCOUNTER — Ambulatory Visit: Payer: Medicare Other | Admitting: Family Medicine

## 2020-04-28 DIAGNOSIS — C679 Malignant neoplasm of bladder, unspecified: Secondary | ICD-10-CM | POA: Diagnosis not present

## 2020-04-28 DIAGNOSIS — I2510 Atherosclerotic heart disease of native coronary artery without angina pectoris: Secondary | ICD-10-CM | POA: Diagnosis not present

## 2020-04-28 DIAGNOSIS — I252 Old myocardial infarction: Secondary | ICD-10-CM | POA: Diagnosis not present

## 2020-04-28 DIAGNOSIS — Z87891 Personal history of nicotine dependence: Secondary | ICD-10-CM | POA: Diagnosis not present

## 2020-04-29 DIAGNOSIS — I252 Old myocardial infarction: Secondary | ICD-10-CM | POA: Diagnosis not present

## 2020-04-29 DIAGNOSIS — C679 Malignant neoplasm of bladder, unspecified: Secondary | ICD-10-CM | POA: Diagnosis not present

## 2020-04-29 DIAGNOSIS — Z87891 Personal history of nicotine dependence: Secondary | ICD-10-CM | POA: Diagnosis not present

## 2020-04-29 DIAGNOSIS — I2510 Atherosclerotic heart disease of native coronary artery without angina pectoris: Secondary | ICD-10-CM | POA: Diagnosis not present

## 2020-04-30 DIAGNOSIS — I252 Old myocardial infarction: Secondary | ICD-10-CM | POA: Diagnosis not present

## 2020-04-30 DIAGNOSIS — Z87891 Personal history of nicotine dependence: Secondary | ICD-10-CM | POA: Diagnosis not present

## 2020-04-30 DIAGNOSIS — C679 Malignant neoplasm of bladder, unspecified: Secondary | ICD-10-CM | POA: Diagnosis not present

## 2020-04-30 DIAGNOSIS — I2510 Atherosclerotic heart disease of native coronary artery without angina pectoris: Secondary | ICD-10-CM | POA: Diagnosis not present

## 2020-05-04 DIAGNOSIS — C679 Malignant neoplasm of bladder, unspecified: Secondary | ICD-10-CM | POA: Diagnosis not present

## 2020-05-04 DIAGNOSIS — I252 Old myocardial infarction: Secondary | ICD-10-CM | POA: Diagnosis not present

## 2020-05-04 DIAGNOSIS — Z87891 Personal history of nicotine dependence: Secondary | ICD-10-CM | POA: Diagnosis not present

## 2020-05-04 DIAGNOSIS — I2510 Atherosclerotic heart disease of native coronary artery without angina pectoris: Secondary | ICD-10-CM | POA: Diagnosis not present

## 2020-05-05 DIAGNOSIS — I2510 Atherosclerotic heart disease of native coronary artery without angina pectoris: Secondary | ICD-10-CM | POA: Diagnosis not present

## 2020-05-05 DIAGNOSIS — I252 Old myocardial infarction: Secondary | ICD-10-CM | POA: Diagnosis not present

## 2020-05-05 DIAGNOSIS — C679 Malignant neoplasm of bladder, unspecified: Secondary | ICD-10-CM | POA: Diagnosis not present

## 2020-05-05 DIAGNOSIS — Z87891 Personal history of nicotine dependence: Secondary | ICD-10-CM | POA: Diagnosis not present

## 2020-05-06 DIAGNOSIS — Z87891 Personal history of nicotine dependence: Secondary | ICD-10-CM | POA: Diagnosis not present

## 2020-05-06 DIAGNOSIS — I2510 Atherosclerotic heart disease of native coronary artery without angina pectoris: Secondary | ICD-10-CM | POA: Diagnosis not present

## 2020-05-06 DIAGNOSIS — C679 Malignant neoplasm of bladder, unspecified: Secondary | ICD-10-CM | POA: Diagnosis not present

## 2020-05-06 DIAGNOSIS — I252 Old myocardial infarction: Secondary | ICD-10-CM | POA: Diagnosis not present

## 2020-05-07 DIAGNOSIS — C679 Malignant neoplasm of bladder, unspecified: Secondary | ICD-10-CM | POA: Diagnosis not present

## 2020-05-07 DIAGNOSIS — I2510 Atherosclerotic heart disease of native coronary artery without angina pectoris: Secondary | ICD-10-CM | POA: Diagnosis not present

## 2020-05-07 DIAGNOSIS — Z87891 Personal history of nicotine dependence: Secondary | ICD-10-CM | POA: Diagnosis not present

## 2020-05-07 DIAGNOSIS — I252 Old myocardial infarction: Secondary | ICD-10-CM | POA: Diagnosis not present

## 2020-05-11 DIAGNOSIS — C679 Malignant neoplasm of bladder, unspecified: Secondary | ICD-10-CM | POA: Diagnosis not present

## 2020-05-11 DIAGNOSIS — I252 Old myocardial infarction: Secondary | ICD-10-CM | POA: Diagnosis not present

## 2020-05-11 DIAGNOSIS — Z87891 Personal history of nicotine dependence: Secondary | ICD-10-CM | POA: Diagnosis not present

## 2020-05-11 DIAGNOSIS — I2510 Atherosclerotic heart disease of native coronary artery without angina pectoris: Secondary | ICD-10-CM | POA: Diagnosis not present

## 2020-05-12 DIAGNOSIS — Z87891 Personal history of nicotine dependence: Secondary | ICD-10-CM | POA: Diagnosis not present

## 2020-05-12 DIAGNOSIS — I252 Old myocardial infarction: Secondary | ICD-10-CM | POA: Diagnosis not present

## 2020-05-12 DIAGNOSIS — I2510 Atherosclerotic heart disease of native coronary artery without angina pectoris: Secondary | ICD-10-CM | POA: Diagnosis not present

## 2020-05-12 DIAGNOSIS — C679 Malignant neoplasm of bladder, unspecified: Secondary | ICD-10-CM | POA: Diagnosis not present

## 2020-05-13 DIAGNOSIS — Z87891 Personal history of nicotine dependence: Secondary | ICD-10-CM | POA: Diagnosis not present

## 2020-05-13 DIAGNOSIS — I252 Old myocardial infarction: Secondary | ICD-10-CM | POA: Diagnosis not present

## 2020-05-13 DIAGNOSIS — I2510 Atherosclerotic heart disease of native coronary artery without angina pectoris: Secondary | ICD-10-CM | POA: Diagnosis not present

## 2020-05-13 DIAGNOSIS — C679 Malignant neoplasm of bladder, unspecified: Secondary | ICD-10-CM | POA: Diagnosis not present

## 2020-05-14 DIAGNOSIS — Z87891 Personal history of nicotine dependence: Secondary | ICD-10-CM | POA: Diagnosis not present

## 2020-05-14 DIAGNOSIS — I252 Old myocardial infarction: Secondary | ICD-10-CM | POA: Diagnosis not present

## 2020-05-14 DIAGNOSIS — I2510 Atherosclerotic heart disease of native coronary artery without angina pectoris: Secondary | ICD-10-CM | POA: Diagnosis not present

## 2020-05-14 DIAGNOSIS — C679 Malignant neoplasm of bladder, unspecified: Secondary | ICD-10-CM | POA: Diagnosis not present

## 2020-05-15 DIAGNOSIS — I252 Old myocardial infarction: Secondary | ICD-10-CM | POA: Diagnosis not present

## 2020-05-15 DIAGNOSIS — I2510 Atherosclerotic heart disease of native coronary artery without angina pectoris: Secondary | ICD-10-CM | POA: Diagnosis not present

## 2020-05-15 DIAGNOSIS — Z87891 Personal history of nicotine dependence: Secondary | ICD-10-CM | POA: Diagnosis not present

## 2020-05-15 DIAGNOSIS — C679 Malignant neoplasm of bladder, unspecified: Secondary | ICD-10-CM | POA: Diagnosis not present

## 2020-05-18 DIAGNOSIS — Z87891 Personal history of nicotine dependence: Secondary | ICD-10-CM | POA: Diagnosis not present

## 2020-05-18 DIAGNOSIS — I2510 Atherosclerotic heart disease of native coronary artery without angina pectoris: Secondary | ICD-10-CM | POA: Diagnosis not present

## 2020-05-18 DIAGNOSIS — I252 Old myocardial infarction: Secondary | ICD-10-CM | POA: Diagnosis not present

## 2020-05-18 DIAGNOSIS — C679 Malignant neoplasm of bladder, unspecified: Secondary | ICD-10-CM | POA: Diagnosis not present

## 2020-05-19 DIAGNOSIS — I252 Old myocardial infarction: Secondary | ICD-10-CM | POA: Diagnosis not present

## 2020-05-19 DIAGNOSIS — I2510 Atherosclerotic heart disease of native coronary artery without angina pectoris: Secondary | ICD-10-CM | POA: Diagnosis not present

## 2020-05-19 DIAGNOSIS — C679 Malignant neoplasm of bladder, unspecified: Secondary | ICD-10-CM | POA: Diagnosis not present

## 2020-05-19 DIAGNOSIS — Z87891 Personal history of nicotine dependence: Secondary | ICD-10-CM | POA: Diagnosis not present

## 2020-05-20 DIAGNOSIS — C679 Malignant neoplasm of bladder, unspecified: Secondary | ICD-10-CM | POA: Diagnosis not present

## 2020-05-20 DIAGNOSIS — I2510 Atherosclerotic heart disease of native coronary artery without angina pectoris: Secondary | ICD-10-CM | POA: Diagnosis not present

## 2020-05-20 DIAGNOSIS — I252 Old myocardial infarction: Secondary | ICD-10-CM | POA: Diagnosis not present

## 2020-05-20 DIAGNOSIS — Z87891 Personal history of nicotine dependence: Secondary | ICD-10-CM | POA: Diagnosis not present

## 2020-05-21 DIAGNOSIS — C679 Malignant neoplasm of bladder, unspecified: Secondary | ICD-10-CM | POA: Diagnosis not present

## 2020-05-21 DIAGNOSIS — Z87891 Personal history of nicotine dependence: Secondary | ICD-10-CM | POA: Diagnosis not present

## 2020-05-21 DIAGNOSIS — I252 Old myocardial infarction: Secondary | ICD-10-CM | POA: Diagnosis not present

## 2020-05-21 DIAGNOSIS — I2510 Atherosclerotic heart disease of native coronary artery without angina pectoris: Secondary | ICD-10-CM | POA: Diagnosis not present

## 2020-06-03 DEATH — deceased
